# Patient Record
Sex: Female | Born: 1965
Health system: Southern US, Community
[De-identification: ages and names within clinical notes are randomized; demographics above are authoritative.]

## PROBLEM LIST (undated history)

## (undated) DIAGNOSIS — Z9889 Other specified postprocedural states: Secondary | ICD-10-CM

## (undated) DIAGNOSIS — I82409 Acute embolism and thrombosis of unspecified deep veins of unspecified lower extremity: Secondary | ICD-10-CM

## (undated) DIAGNOSIS — M4800 Spinal stenosis, site unspecified: Secondary | ICD-10-CM

## (undated) DIAGNOSIS — J189 Pneumonia, unspecified organism: Secondary | ICD-10-CM

## (undated) DIAGNOSIS — D649 Anemia, unspecified: Secondary | ICD-10-CM

## (undated) DIAGNOSIS — R112 Nausea with vomiting, unspecified: Secondary | ICD-10-CM

## (undated) DIAGNOSIS — M810 Age-related osteoporosis without current pathological fracture: Secondary | ICD-10-CM

## (undated) DIAGNOSIS — M5136 Other intervertebral disc degeneration, lumbar region: Secondary | ICD-10-CM

## (undated) DIAGNOSIS — I10 Essential (primary) hypertension: Secondary | ICD-10-CM

## (undated) DIAGNOSIS — E209 Hypoparathyroidism, unspecified: Secondary | ICD-10-CM

## (undated) DIAGNOSIS — G43909 Migraine, unspecified, not intractable, without status migrainosus: Secondary | ICD-10-CM

## (undated) DIAGNOSIS — E559 Vitamin D deficiency, unspecified: Secondary | ICD-10-CM

## (undated) DIAGNOSIS — F32A Depression, unspecified: Secondary | ICD-10-CM

## (undated) DIAGNOSIS — Z86711 Personal history of pulmonary embolism: Secondary | ICD-10-CM

## (undated) DIAGNOSIS — Z87442 Personal history of urinary calculi: Secondary | ICD-10-CM

## (undated) DIAGNOSIS — E119 Type 2 diabetes mellitus without complications: Secondary | ICD-10-CM

## (undated) DIAGNOSIS — F329 Major depressive disorder, single episode, unspecified: Secondary | ICD-10-CM

## (undated) DIAGNOSIS — N809 Endometriosis, unspecified: Secondary | ICD-10-CM

## (undated) DIAGNOSIS — M8580 Other specified disorders of bone density and structure, unspecified site: Secondary | ICD-10-CM

## (undated) DIAGNOSIS — E669 Obesity, unspecified: Secondary | ICD-10-CM

## (undated) DIAGNOSIS — M1990 Unspecified osteoarthritis, unspecified site: Secondary | ICD-10-CM

## (undated) DIAGNOSIS — F419 Anxiety disorder, unspecified: Secondary | ICD-10-CM

## (undated) DIAGNOSIS — I872 Venous insufficiency (chronic) (peripheral): Secondary | ICD-10-CM

## (undated) DIAGNOSIS — K219 Gastro-esophageal reflux disease without esophagitis: Secondary | ICD-10-CM

## (undated) DIAGNOSIS — M51369 Other intervertebral disc degeneration, lumbar region without mention of lumbar back pain or lower extremity pain: Secondary | ICD-10-CM

## (undated) HISTORY — DX: Gastro-esophageal reflux disease without esophagitis: K21.9

## (undated) HISTORY — DX: Acute embolism and thrombosis of unspecified deep veins of unspecified lower extremity: I82.409

## (undated) HISTORY — PX: SALPINGOOPHORECTOMY: SHX82

## (undated) HISTORY — PX: TOTAL KNEE ARTHROPLASTY: SHX125

## (undated) HISTORY — DX: Essential (primary) hypertension: I10

## (undated) HISTORY — PX: COLONOSCOPY W/ POLYPECTOMY: SHX1380

## (undated) HISTORY — DX: Personal history of urinary calculi: Z87.442

## (undated) HISTORY — PX: JOINT REPLACEMENT: SHX530

## (undated) HISTORY — PX: KIDNEY STONE SURGERY: SHX686

---
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
## 1988-08-25 HISTORY — PX: CHOLECYSTECTOMY OPEN: SUR202

## 1999-07-31 ENCOUNTER — Encounter: Payer: Self-pay | Admitting: Specialist

## 1999-08-02 ENCOUNTER — Inpatient Hospital Stay (HOSPITAL_COMMUNITY): Admission: RE | Admit: 1999-08-02 | Discharge: 1999-08-07 | Payer: Self-pay | Admitting: Specialist

## 1999-08-14 ENCOUNTER — Ambulatory Visit (HOSPITAL_COMMUNITY): Admission: RE | Admit: 1999-08-14 | Discharge: 1999-08-14 | Payer: Self-pay | Admitting: Specialist

## 1999-10-28 ENCOUNTER — Ambulatory Visit: Admission: RE | Admit: 1999-10-28 | Discharge: 1999-10-28 | Payer: Self-pay | Admitting: Specialist

## 1999-11-04 ENCOUNTER — Ambulatory Visit: Admission: RE | Admit: 1999-11-04 | Discharge: 1999-11-04 | Payer: Self-pay | Admitting: Specialist

## 2001-06-02 ENCOUNTER — Encounter: Payer: Self-pay | Admitting: Specialist

## 2001-06-02 ENCOUNTER — Ambulatory Visit (HOSPITAL_COMMUNITY): Admission: RE | Admit: 2001-06-02 | Discharge: 2001-06-02 | Payer: Self-pay | Admitting: Specialist

## 2003-01-02 ENCOUNTER — Encounter: Admission: RE | Admit: 2003-01-02 | Discharge: 2003-01-02 | Payer: Self-pay | Admitting: Specialist

## 2003-01-02 ENCOUNTER — Encounter: Payer: Self-pay | Admitting: Specialist

## 2003-03-22 ENCOUNTER — Encounter: Admission: RE | Admit: 2003-03-22 | Discharge: 2003-06-20 | Payer: Self-pay | Admitting: *Deleted

## 2003-03-23 ENCOUNTER — Ambulatory Visit (HOSPITAL_COMMUNITY): Admission: RE | Admit: 2003-03-23 | Discharge: 2003-03-23 | Payer: Self-pay | Admitting: *Deleted

## 2003-03-23 ENCOUNTER — Encounter: Payer: Self-pay | Admitting: Cardiology

## 2003-03-29 ENCOUNTER — Encounter: Admission: RE | Admit: 2003-03-29 | Discharge: 2003-03-29 | Payer: Self-pay | Admitting: *Deleted

## 2003-07-07 ENCOUNTER — Encounter: Admission: RE | Admit: 2003-07-07 | Discharge: 2003-07-07 | Payer: Self-pay | Admitting: Specialist

## 2003-07-13 ENCOUNTER — Encounter: Admission: RE | Admit: 2003-07-13 | Discharge: 2003-10-11 | Payer: Self-pay | Admitting: *Deleted

## 2003-07-28 ENCOUNTER — Ambulatory Visit (HOSPITAL_COMMUNITY): Admission: RE | Admit: 2003-07-28 | Discharge: 2003-07-28 | Payer: Self-pay | Admitting: *Deleted

## 2003-07-31 ENCOUNTER — Inpatient Hospital Stay (HOSPITAL_COMMUNITY): Admission: RE | Admit: 2003-07-31 | Discharge: 2003-08-03 | Payer: Self-pay | Admitting: General Surgery

## 2003-07-31 HISTORY — PX: GASTRIC BYPASS: SHX52

## 2003-08-15 ENCOUNTER — Ambulatory Visit (HOSPITAL_COMMUNITY): Admission: RE | Admit: 2003-08-15 | Discharge: 2003-08-15 | Payer: Self-pay | Admitting: *Deleted

## 2003-10-02 ENCOUNTER — Encounter: Admission: RE | Admit: 2003-10-02 | Discharge: 2003-10-02 | Payer: Self-pay | Admitting: General Surgery

## 2003-10-23 ENCOUNTER — Encounter: Admission: RE | Admit: 2003-10-23 | Discharge: 2004-01-21 | Payer: Self-pay | Admitting: *Deleted

## 2004-01-14 DIAGNOSIS — I10 Essential (primary) hypertension: Secondary | ICD-10-CM | POA: Insufficient documentation

## 2004-01-29 ENCOUNTER — Encounter: Admission: RE | Admit: 2004-01-29 | Discharge: 2004-04-28 | Payer: Self-pay | Admitting: *Deleted

## 2004-01-31 DIAGNOSIS — F32A Depression, unspecified: Secondary | ICD-10-CM | POA: Insufficient documentation

## 2004-01-31 DIAGNOSIS — N200 Calculus of kidney: Secondary | ICD-10-CM | POA: Insufficient documentation

## 2004-03-12 ENCOUNTER — Ambulatory Visit (HOSPITAL_COMMUNITY): Admission: RE | Admit: 2004-03-12 | Discharge: 2004-03-12 | Payer: Self-pay | Admitting: General Surgery

## 2004-03-12 ENCOUNTER — Ambulatory Visit (HOSPITAL_BASED_OUTPATIENT_CLINIC_OR_DEPARTMENT_OTHER): Admission: RE | Admit: 2004-03-12 | Discharge: 2004-03-12 | Payer: Self-pay | Admitting: General Surgery

## 2004-03-12 ENCOUNTER — Encounter (INDEPENDENT_AMBULATORY_CARE_PROVIDER_SITE_OTHER): Payer: Self-pay | Admitting: *Deleted

## 2004-03-12 HISTORY — PX: MASS EXCISION: SHX2000

## 2004-03-22 ENCOUNTER — Inpatient Hospital Stay (HOSPITAL_COMMUNITY): Admission: EM | Admit: 2004-03-22 | Discharge: 2004-03-24 | Payer: Self-pay | Admitting: Emergency Medicine

## 2004-03-22 HISTORY — PX: BOWEL RESECTION: SHX1257

## 2004-07-30 ENCOUNTER — Encounter: Admission: RE | Admit: 2004-07-30 | Discharge: 2004-10-28 | Payer: Self-pay | Admitting: *Deleted

## 2004-09-06 ENCOUNTER — Encounter: Admission: RE | Admit: 2004-09-06 | Discharge: 2004-09-06 | Payer: Self-pay | Admitting: General Surgery

## 2004-09-09 HISTORY — PX: OTHER SURGICAL HISTORY: SHX169

## 2004-09-10 ENCOUNTER — Inpatient Hospital Stay (HOSPITAL_COMMUNITY): Admission: RE | Admit: 2004-09-10 | Discharge: 2004-09-11 | Payer: Self-pay | Admitting: General Surgery

## 2004-10-01 ENCOUNTER — Encounter: Admission: RE | Admit: 2004-10-01 | Discharge: 2004-10-01 | Payer: Self-pay | Admitting: General Surgery

## 2005-09-17 ENCOUNTER — Ambulatory Visit (HOSPITAL_COMMUNITY): Admission: RE | Admit: 2005-09-17 | Discharge: 2005-09-17 | Payer: Self-pay | Admitting: Specialist

## 2006-01-23 HISTORY — PX: KNEE ARTHROSCOPY: SUR90

## 2006-02-02 ENCOUNTER — Encounter: Admission: RE | Admit: 2006-02-02 | Discharge: 2006-02-02 | Payer: Self-pay | Admitting: General Surgery

## 2006-08-25 HISTORY — PX: ABDOMINAL HYSTERECTOMY: SHX81

## 2006-09-24 ENCOUNTER — Encounter: Admission: RE | Admit: 2006-09-24 | Discharge: 2006-09-24 | Payer: Self-pay | Admitting: General Surgery

## 2007-08-25 ENCOUNTER — Encounter: Admission: RE | Admit: 2007-08-25 | Discharge: 2007-08-25 | Payer: Self-pay | Admitting: Specialist

## 2007-08-26 HISTORY — PX: SPINE SURGERY: SHX786

## 2007-08-27 ENCOUNTER — Encounter: Admission: RE | Admit: 2007-08-27 | Discharge: 2007-08-27 | Payer: Self-pay | Admitting: Specialist

## 2007-12-13 ENCOUNTER — Encounter: Admission: RE | Admit: 2007-12-13 | Discharge: 2007-12-13 | Payer: Self-pay | Admitting: Orthopedic Surgery

## 2008-03-24 ENCOUNTER — Encounter: Admission: RE | Admit: 2008-03-24 | Discharge: 2008-03-24 | Payer: Self-pay | Admitting: Specialist

## 2008-04-13 ENCOUNTER — Observation Stay (HOSPITAL_COMMUNITY): Admission: RE | Admit: 2008-04-13 | Discharge: 2008-04-15 | Payer: Self-pay | Admitting: Specialist

## 2008-04-14 ENCOUNTER — Encounter (INDEPENDENT_AMBULATORY_CARE_PROVIDER_SITE_OTHER): Payer: Self-pay | Admitting: Specialist

## 2008-04-14 ENCOUNTER — Ambulatory Visit: Payer: Self-pay | Admitting: Vascular Surgery

## 2008-04-26 ENCOUNTER — Ambulatory Visit: Payer: Self-pay | Admitting: Vascular Surgery

## 2009-12-23 HISTORY — PX: KNEE ARTHROSCOPY: SUR90

## 2010-04-25 DIAGNOSIS — I82409 Acute embolism and thrombosis of unspecified deep veins of unspecified lower extremity: Secondary | ICD-10-CM

## 2010-04-25 HISTORY — DX: Acute embolism and thrombosis of unspecified deep veins of unspecified lower extremity: I82.409

## 2010-04-25 HISTORY — PX: NM MYOCAR PERF WALL MOTION: HXRAD629

## 2010-05-10 ENCOUNTER — Inpatient Hospital Stay (HOSPITAL_COMMUNITY)
Admission: EM | Admit: 2010-05-10 | Discharge: 2010-05-11 | Payer: Self-pay | Source: Home / Self Care | Admitting: Cardiovascular Disease

## 2010-10-27 ENCOUNTER — Emergency Department (HOSPITAL_COMMUNITY)
Admission: EM | Admit: 2010-10-27 | Discharge: 2010-10-27 | Disposition: A | Discharge status: Home / Self Care | Payer: 59 | Attending: Emergency Medicine | Admitting: Emergency Medicine

## 2010-10-27 DIAGNOSIS — M7989 Other specified soft tissue disorders: Secondary | ICD-10-CM | POA: Insufficient documentation

## 2010-10-27 DIAGNOSIS — M79609 Pain in unspecified limb: Secondary | ICD-10-CM | POA: Insufficient documentation

## 2010-10-27 DIAGNOSIS — Z86718 Personal history of other venous thrombosis and embolism: Secondary | ICD-10-CM | POA: Insufficient documentation

## 2010-10-27 DIAGNOSIS — Z79899 Other long term (current) drug therapy: Secondary | ICD-10-CM | POA: Insufficient documentation

## 2010-10-27 DIAGNOSIS — Z7901 Long term (current) use of anticoagulants: Secondary | ICD-10-CM | POA: Insufficient documentation

## 2010-10-27 LAB — BASIC METABOLIC PANEL
CO2: 17 mEq/L — ABNORMAL LOW (ref 19–32)
Chloride: 109 mEq/L (ref 96–112)
Creatinine, Ser: 0.68 mg/dL (ref 0.4–1.2)
GFR calc Af Amer: >60 mL/min (ref >60)
Glucose, Bld: 100 mg/dL — ABNORMAL HIGH (ref 70–99)

## 2010-10-27 LAB — PROTIME-INR: INR: 1.95 — ABNORMAL HIGH (ref 0.00–1.49)

## 2010-10-27 LAB — CBC
HCT: 38.5 % (ref 36.0–46.0)
Hemoglobin: 12.8 g/dL (ref 12.0–15.0)
MCH: 28.7 pg (ref 26.0–34.0)
MCHC: 33.2 g/dL (ref 30.0–36.0)
MCV: 86.3 fL (ref 78.0–100.0)
RBC: 4.46 MIL/uL (ref 3.87–5.11)

## 2010-11-07 LAB — PROTHROMBIN GENE MUTATION

## 2010-11-07 LAB — CARDIAC PANEL(CRET KIN+CKTOT+MB+TROPI): Relative Index: INVALID (ref 0.0–2.5)

## 2010-11-07 LAB — BETA-2-GLYCOPROTEIN I ABS, IGG/M/A
Beta-2 Glyco I IgG: 0 G Units (ref <20)
Beta-2-Glycoprotein I IgA: 0 A Units (ref <20)
Beta-2-Glycoprotein I IgM: 0 M Units (ref <20)

## 2010-11-07 LAB — CBC
Hemoglobin: 12.7 g/dL (ref 12.0–15.0)
Hemoglobin: 12.9 g/dL (ref 12.0–15.0)
MCH: 30.4 pg (ref 26.0–34.0)
MCH: 30.7 pg (ref 26.0–34.0)
MCHC: 33.2 g/dL (ref 30.0–36.0)
MCV: 92.4 fL (ref 78.0–100.0)
Platelets: 287 10*3/uL (ref 150–400)
RBC: 4.18 MIL/uL (ref 3.87–5.11)
RBC: 4.2 MIL/uL (ref 3.87–5.11)

## 2010-11-07 LAB — PROTEIN S, TOTAL: Protein S Ag, Total: 90 % (ref 70–140)

## 2010-11-07 LAB — D-DIMER, QUANTITATIVE: D-Dimer, Quant: 1.73 ug/mL-FEU — ABNORMAL HIGH (ref 0.00–0.48)

## 2010-11-07 LAB — DIFFERENTIAL
Basophils Absolute: 0 10*3/uL (ref 0.0–0.1)
Lymphocytes Relative: 28 % (ref 12–46)
Monocytes Relative: 7 % (ref 3–12)
Smear Review: ADEQUATE

## 2010-11-07 LAB — COMPREHENSIVE METABOLIC PANEL
BUN: 11 mg/dL (ref 6–23)
CO2: 22 mEq/L (ref 19–32)
Chloride: 105 mEq/L (ref 96–112)
Creatinine, Ser: 0.82 mg/dL (ref 0.4–1.2)
GFR calc non Af Amer: >60 mL/min (ref >60)
Total Bilirubin: 0.3 mg/dL (ref 0.3–1.2)

## 2010-11-07 LAB — BASIC METABOLIC PANEL
CO2: 27 mEq/L (ref 19–32)
Calcium: 8.2 mg/dL — ABNORMAL LOW (ref 8.4–10.5)
GFR calc Af Amer: >60 mL/min (ref >60)
Sodium: 139 mEq/L (ref 135–145)

## 2010-11-07 LAB — LUPUS ANTICOAGULANT PANEL: Lupus Anticoagulant: NOT DETECTED

## 2010-11-07 LAB — FACTOR 5 LEIDEN

## 2010-11-07 LAB — HOMOCYSTEINE: Homocysteine: 9.5 umol/L (ref 4.0–15.4)

## 2010-11-07 LAB — MAGNESIUM: Magnesium: 2.1 mg/dL (ref 1.5–2.5)

## 2010-11-07 LAB — CARDIOLIPIN ANTIBODIES, IGG, IGM, IGA: Anticardiolipin IgG: 1 GPL U/mL — ABNORMAL LOW (ref <23)

## 2010-11-07 LAB — PROTEIN S ACTIVITY: Protein S Activity: 52 % — ABNORMAL LOW (ref 69–129)

## 2011-01-07 NOTE — Op Note (Signed)
NAMEABEER, DESKINS                ACCOUNT NO.:  [id number]   MEDICAL RECORD NO.:  [id number]          PATIENT TYPE:  AMB   LOCATION:  DAY                          FACILITY:  [facility]   PHYSICIAN:  Jene Every, M.D.    DATE OF BIRTH:  Oct 26, 1965   DATE OF PROCEDURE:  04/13/2008  DATE OF DISCHARGE:                               OPERATIVE REPORT   PREOPERATIVE DIAGNOSES:  Spinal stenosis, herniated nucleus pulposus at  4-5 and possibly 5-1.   PROCEDURE PERFORMED:  Lateral recess decompression at 4-5,  hemilaminectomy at 5, microdiskectomy at 4-5.   ANESTHESIA:  General.   ASSISTANT:  Roma Schanz, P.A.   BRIEF HISTORY AND INDICATIONS:  The patient is a 45 year old with severe  left lower extremity radicular pain, positive neural tension sign, EHL  weakness and has an MRI which of April of 2008 which demonstrated  degenerative changes at 4-5 and 5-1, small disk protrusions at 4-5 and 5-  1.  She did have persistent symptoms.  She underwent a myelogram which  indicated some lateral recess stenosis at 4-5, multifactorial, small  amount of compressive disk protrusion at 5-1.  She was indicated for  decompression due to the persistent pain, positive neural tension sign  and had some EHL weakness and L5 radiculopathy.  We felt she had a  component of a dynamic neural compressive lesion.  She was indicated for  decompression.  The risks and benefits were discussed to include  bleeding, infection, damage to neurovascular structures, CSF leakage,  epidural fibrosis, adjacent segment disease needing fusion in the  future, anesthetic complications, DVT, PE, etc.   TECHNIQUE:  With the patient in the supine position after induction of  adequate anesthesia and clindamycin, the lumbar region was prepped and  draped in the usual sterile fashion.  Two 18 gauge spinal needles were  utilized to localize 4-5 and 5-1 interspace.  This was confirmed with x-  ray.  Incision was made from the  spinous process of 4 to below 5.  The  subcutaneous tissue was dissected.  Electrocautery was utilized to  achieve hemostasis.  Dorsolumbar fascia with skin incision.  The  paraspinous muscle elevated from lamina of 4 and 5.  Operating  microscope was draped brought in to the surgical field.  Hemilaminotomy  at the caudad edge of 4 was performed with a 2 mm Kerrison, detaching  the ligamentum flavum and from the cephalad edge of 5 doing the same.  We then proceeded to remove the ligamentum flavum from the interspace.  Significant hypertrophied ligamentum flavum was noted compressing the 5  root into the lateral recess.  There was a epidural venous plexus as  noted as well contributing to the neural compression, as a small focal  disk herniation.  Bipolar cautery was utilized to achieve hemostasis and  decompressed the lateral border to the medial border of pedicle.  We  then took a confirmatory radiograph with a hockey stick probe in the  foramen of 4.  We then performed an annulotomy and a copious portion of  disk material was removed from the disk space  with a straight and  upbiting pituitary.  A full diskectomy of herniated material was then  performed.  The disk space was copiously irrigated with antibiotic  irrigation.  We continued the foraminotomy distally removing the hemi-  lamina.  We tracked the 5 root out through the foramen.  We also placed  a hockey-stick probe below to the disk space at 5-1.  There was no  evidence of disk herniation at 5-1 or significant lateral recess  stenosis.  We placed bone wax on the cancellous surfaces.  We used  thrombin soaked Gelfoam after another round of antibiotic irrigation.  I  felt that this pathology was consistent with her preoperative clinical  symptomatology, predominantly L5 nerve root.  Next, the [facility]  retractor was removed.  Paraspinous muscle inspected with no active  bleeding.  Dorsolumbar fascia reapproximated with #1 Vicryl  interrupted  figure-of-eight sutures.  Subcutaneous tissue reapproximated with 2-0  Vicryl simple sutures.  Skin was reapproximated with staples and was  dressed sterilely.  She was placed supine on hospital bed, extubated  without difficulty and transported to the recovery room in satisfactory  condition.   The patient tolerated the procedure and there were no complications.  Minimal blood loss 20 mL.   The patient had a history of the DVT and a PE.  We used the TEDs and the  compression stockings throughout the case.  Her abdomen was free and the  popliteal angle was increased to avoid compression at the popliteal  region.  Postoperatively, the patient will be ambulated the day of  surgery.  We will obtain postoperative Doppler and have her sent  home  on appropriate DVT prophylaxis.      Jene Every, M.D.  Electronically Signed     JB/MEDQ  D:  04/13/2008  T:  04/13/2008  Job:  [id number]

## 2011-01-07 NOTE — Procedures (Signed)
DUPLEX DEEP VENOUS EXAM - LOWER EXTREMITY   INDICATION:  Right lower extremity pain and swelling, status post back  surgery.   HISTORY:  Edema:  Yes.  Trauma/Surgery:  Yes.  Pain:  Yes.  PE:  Yes.  Previous DVT:  Yes.  Anticoagulants:  None.  Other:   DUPLEX EXAM:                CFV   SFV   PopV  PTV    GSV                R  L  R  L  R  L  R   L  R  L  Thrombosis    o  o  o     o     o      o  Spontaneous   +  +  +     +     +      +  Phasic        +  +  +     +     +      +  Augmentation  +  +  +     +     +      +  Compressible  +  +  +     +     +      +  Competent     +  +  +     +     +      +   Legend:  + - yes  o - no  p - partial  D - decreased   IMPRESSION:  No evidence of deep venous thrombosis noted in the right  leg.   Notified Christine with results.    _____________________________  Larina Earthly, M.D.   MG/MEDQ  D:  04/26/2008  T:  04/26/2008  Job:  [id number]

## 2011-01-10 NOTE — Op Note (Signed)
NAME:  Ana Baker, Ana Baker                          ACCOUNT NO.:  [id number]   MEDICAL RECORD NO.:  [id number]                   PATIENT TYPE:  AMB   LOCATION:  DSC                                  FACILITY:  [facility]   PHYSICIAN:  Sharlet Salina T. Hoxworth, M.D.          DATE OF BIRTH:  04-24-1966   DATE OF PROCEDURE:  03/12/2004  DATE OF DISCHARGE:                                 OPERATIVE REPORT   PREOPERATIVE DIAGNOSIS:  Painful subcutaneous mass, right arm.   POSTOPERATIVE DIAGNOSIS:  Painful subcutaneous mass, right arm.   OPERATION PERFORMED:  Excision of 3 cm subcutaneous mass, right arm.   SURGEON:  Lorne Skeens. Hoxworth, M.D.   ANESTHESIA:  Local.   INDICATIONS FOR PROCEDURE:  Ana Baker is a 45 year old female with a tender  and uncomfortable slightly firm and irregular subcutaneous mass in the right  arm consistent with a lipoma or fibrolipoma.  Due to symptoms, excision has  been recommended and accepted.  She is brought to the minor suite for this.   DESCRIPTION OF PROCEDURE:  The patient was brought to the minor surgery  suite. The right arm was sterilely prepped and draped.  Local anesthesia was  infiltrated over the skin and underlying soft tissue.  A small transverse  incision was made over the mass and dissection carried down to the deep  subcutaneous tissue.  There was a discrete somewhat firm lobular fatty mass  consistent with lipoma that was bluntly completely dissected away from the  surrounding tissue and removed.  There was no bleeding.  The skin was closed  with running subcuticular 4-0 Vicryl and Steri-Strips.  Dry sterile dressing  was applied.                                               Lorne Skeens. Hoxworth, M.D.    Tory Emerald  D:  03/12/2004  T:  03/12/2004  Job:  [id number]

## 2011-01-10 NOTE — H&P (Signed)
NAME:  Ana Baker, Ana Baker                          ACCOUNT NO.:  [id number]   MEDICAL RECORD NO.:  [id number]                   PATIENT TYPE:  INP   LOCATION:  0101                                 FACILITY:  [facility]   PHYSICIAN:  Sharlet Salina T. Hoxworth, M.D.          DATE OF BIRTH:  [birth year]   DATE OF ADMISSION:  03/22/2004  DATE OF DISCHARGE:                                HISTORY & PHYSICAL   CHIEF COMPLAINT:  Abdominal pain.   HISTORY OF PRESENT ILLNESS:  Ana Baker is a 45 year old white female  approximately eight months status post laparoscopic Roux-en-Y gastric  bypass. She has done very well postoperatively with no GI or abdominal  complaints and has had a nearly 100-pound weight loss. This morning,  however, she developed fairly rapid onset of sharp and crampy mid abdominal  epigastric pain which gradually worsened throughout the morning. She  presented to the emergency room about four hours after onset after calling  me. The pain has been fairly severe. She has had some retching, but no  vomiting. Normal bowel movement yesterday. No fever or chills. She has felt  like she had a little bit of reflux and bloating over the past week.   PAST MEDICAL HISTORY:  Surgical history includes open cholecystectomy  through a midline incision a number of years ago. She has right knee  replacement. She has a history of DVT and pulmonary embolus following her  knee replacement. She has been treated for urolithiasis, migraine headaches,  and depression.   MEDICATIONS:  1. Inderal 40 mg a day for migraines prophylaxis.  2. Hydrochlorothiazide 12.5 mg a day for urolithiasis prevention.  3. Lexapro 10 mg a day.   ALLERGIES:  She is allergic to PENICILLIN.   SOCIAL HISTORY:  Single. No cigarette or alcohol use.   REVIEW OF SYSTEMS:  GENERAL: Weight loss as above. No fever or chills.  RESPIRATORY: No shortness of breath, cough, or wheezing. CARDIAC: No chest  pain, palpitations, or history  of heart disease. ABDOMEN/GI: As above.  GU:  No urinary voiding frequency.   PHYSICAL EXAMINATION:  VITAL SIGNS: Temperature 96.6, pulse 69, respirations  20, blood pressure 117/79.  GENERAL: An alert, pleasant white female in no acute distress.  SKIN: Warm and dry without rash or infection.  HEENT: Nonicteric. Nose and oropharynx clear.  No masses.  LUNGS: Clear to auscultation without increased work of breathing.  CARDIAC: Regular rate and rhythm without murmurs. No edema.  ABDOMEN: Possible mild distention.  There mild to moderate epigastric and  mid abdominal tenderness. No palpable mass or hepatosplenomegaly. No trocar  site hernias or incisional hernias.  EXTREMITIES: Well healed incision, right knee. No swelling deformity.  NEUROLOGIC: Alert and fully oriented. The rest of exam is grossly normal.   LABORATORY DATA:  White count 5.7, hemoglobin 14. Electrolytes abnormal for  a potassium of 3.1. LFTs were normal.   CT scan of the abdomen and  pelvis were obtained revealing evidence of  partial small bowel obstruction at about the level of the mid small bowel  with possibly a transition zone about the left mid abdomen or upper pelvis.   ASSESSMENT/PLAN:  Partial small bowel obstruction, eight months status post  gastric bypass. Concern of the situation is that she could have an internal  hernia with vascular compromise without. Emergent laparoscopy for diagnosis  and treatment have been recommended and accepted, and she is admitted for  this procedure.                                               Lorne Skeens. Hoxworth, M.D.    Tory Emerald  D:  03/22/2004  T:  03/22/2004  Job:  [id number]

## 2011-01-10 NOTE — Op Note (Signed)
NAME:  Ana Baker, Ana Baker                          ACCOUNT NO.:  [id number]   MEDICAL RECORD NO.:  [id number]                   PATIENT TYPE:  INP   LOCATION:  0155                                 FACILITY:  [facility]   PHYSICIAN:  Sharlet Salina T. Hoxworth, M.D.          DATE OF BIRTH:  07/29/1966   DATE OF PROCEDURE:  07/31/2003  DATE OF DISCHARGE:                                 OPERATIVE REPORT   PREOPERATIVE DIAGNOSES:  1. Morbid obesity.  2. Hypertension.  3. Osteoarthritis.   POSTOPERATIVE DIAGNOSES:  1. Morbid obesity.  2. Hypertension.  3. Osteoarthritis.   SURGICAL PROCEDURE:  Laparoscopic Roux-en-Y gastric bypass surgery.   SURGEON:  Lorne Skeens. Hoxworth, M.D.   ASSISTANT:  Thornton Park. Daphine Deutscher, M.D.   ANESTHESIA:  General.   BRIEF HISTORY:  Ana Baker is a 45 year old female with a lifelong history of  morbid obesity unresponsive to medical management.  She has comorbidities of  hypertension and osteoarthritis as well as a history of DVT and pulmonary  embolus in 1999.  She has had an open cholecystectomy.  After extensive  preoperative consultation and workup and assessment of  options detailed  elsewhere, we have elected to proceed with laparoscopic Roux-en-Y gastric  bypass.  She underwent mechanical and antibiotic bowel prep at home.  Was  brought to the operating room and has received preoperative broad-spectrum  antibiotics and 80 mg of Lovenox subcutaneously.   DESCRIPTION OF OPERATION:  The patient was carefully positioned and padded  on the operating table and a general endotracheal anesthesia was induced.  The abdomen was widely sterilely prepped and draped.  Local anesthesia was  used to infiltrate the trocar sites prior to the incisions.  A 1 cm incision  was made at the left costal margin at the midclavicular line and access was  obtained with the Optiview trocar without difficulty.  Noted were extensive  omental adhesions to her previous upper midline incision  but no apparent  bowel adhesions.  Under direct vision a 5 mm trocar was placed in the left  flank.  Using a Harmonic scalpel through this port the adhesions were all  taken down from the anterior double wall and the falciform ligament was also  taken down and the edge of the right lobe of the liver mobilized from  adhesions, all from her previous surgery.  This allowed under direct vision  two 12 mm trocars to be placed in the mid-upper abdomen to the left and  right of midline and another 12 mm trocar in the right upper quadrant in an  area where the falciform ligament had been taken down.  The bowel appeared  normal, and there were no small bowel adhesions.  The omentum and mesocolon  were elevated and the ligament of Treitz identified and a 40 cm afferent  limb measured.  At this point the bowel appeared quite mobile, reaching up  in an anticolic fashion to the  left lobe of the liver.  The small bowel was  divided at this point with a single firing of the Endo-GIA 45 mm white load  stapler.  The Roux limb was mobilized by dividing the mesentery somewhat  down through one further arcade with the Harmonic scalpel.  The efferent  limb was marked by suturing a Penrose drain to it.  The 100 cm efferent limb  was then carefully measured.  At this point a stay suture was placed between  the afferent limb and the efferent limb and a side-to-side jejunojejunostomy  was created with enterotomies being created with the Harmonic scalpel and  with a single firing of the white load 45 mm stapler.  The common enterotomy  was then closed with running 2-0 Vicryl begun at either end of the defect  and tied centrally.  Following this the mesenteric defect beneath the  afferent limb was closed with running 2-0 silk and this was carried onto the  jejunojejunostomy and completely closing this mesenteric defect.  Tisseel  tissue sealant was placed over the enterotomy closure.  The patient was then  placed in  reverse Trendelenburg and a Nathanson liver retractor placed  through a 5 mm site in the subxiphoid area and the left lobe elevated,  nicely exposing the angle of His and the EG junction.  The angle of His was  mobilized off the left crus with the Harmonic scalpel, as was the first  portion of the greater curve and up onto the EG junction.  The lesser curve  was then carefully examined and a point about 5 cm from the EG junction just  below the second vessel was chosen and using Harmonic scalpel, dissection  was carried along the lesser curve directly onto the stomach toward the  lesser sac.  This dissection progressed to right angles along the stomach  well for several centimeters and then a firing of the Endo-GIA 45 mm blue  load stapler was used to divide the stomach at right angles after making  sure to remove all tubes from the stomach.  Further dissection in the  retrogastric area up toward the angle of His entered the lesser sac.  A  tubular proximal pouch was then created with four more firings of the Endo-  GIA blue load stapler up toward the angle of His with the last two firings  being done with the Ewald tube being placed to avoid narrowing the EG  junction, and it could be seen that the stomach was completely divided up  through the angle of His, creating a nice tubular approximately 5 cm pouch.  At this point the efferent limb was brought up to the pouch and came up  without tension.  The top end of the staple line near the angle of His in  the gastric pouch was covered with Tisseel tissue sealant.  A posterior row  of running 2-0 Vicryl was then used to suture the efferent limb along the  posterior wall and staple line of the gastric pouch working superior and  inferior.  Enterotomies were made of the pouch and the efferent limb with  the Harmonic scalpel and the 45 mm linear blue load stapler was used to  create the gastrojejunostomy.  The staple line was inspected, and there  was no bleeding.  The common defect was then closed with running 2-0 Vicryl  beginning at either end of the defect and tied centrally.  Following this an  outer seromuscular layer of 2-0  Vicryl was run from well above the  enterotomy closure down over it and then to the previously-placed posterior  suture.  This appeared to effect a good, patent anastomosis under no  tension.  At this point clamping the efferent limb, the patient was was  endoscoped by Dr. Daphine Deutscher, which revealed a patent anastomosis, no bleeding.  With the anastomosis under saline and the pouch inflated, there was no  evidence of air leak.  The pouch was then deflated and the endoscope  removed.  The remainder of the staple and suture line of the  gastrojejunostomy was coated with Tisseel tissue sealant.  A closed suction  drain was brought out through the lateral 5 mm trocar site and left in the  subhepatic space.  All CO2 was evacuated and the trocar was removed.  The  skin incisions were closed with staples.  Sponge, needle, and instrument  counts were correct.  Dry sterile dressings were applied, and the patient  was taken to recovery in good condition.                                               Lorne Skeens. Hoxworth, M.D.    Tory Emerald  D:  07/31/2003  T:  07/31/2003  Job:  [id number]

## 2011-01-10 NOTE — Op Note (Signed)
NAME:  Ana Baker, Ana Baker                          ACCOUNT NO.:  [id number]   MEDICAL RECORD NO.:  [id number]                   PATIENT TYPE:  INP   LOCATION:  0482                                 FACILITY:  [facility]   PHYSICIAN:  Sharlet Salina T. Hoxworth, M.D.          DATE OF BIRTH:  [birth year]   DATE OF PROCEDURE:  03/22/2004  DATE OF DISCHARGE:                                 OPERATIVE REPORT   PREOPERATIVE DIAGNOSES:  Small bowel obstruction.   POSTOPERATIVE DIAGNOSES:  Small bowel obstruction.   PROCEDURE:  Laparoscopy and lysis of adhesions for small bowel obstruction.   SURGEON:  Lorne Skeens. Hoxworth, M.D.   ASSISTANT:  Sandria Bales. Ezzard Standing, M.D.   ANESTHESIA:  General.   BRIEF HISTORY:  Ana Baker is a 45 year old female who is approximately  eight months following laparoscopic roux-Y gastric bypass with an  approximately 100 pound weight loss.  She has done extremely well with no GI  symptoms until this morning when she developed acute fairly severe crampy  upper and mid abdominal pain with retching.  She was brought into the  emergency room and evaluation included a CT scan which has shown evidence of  partial small bowel obstruction with both dilated and decompressed small  bowel with an apparent transition possibly in the left mid abdomen in the  more distal bowel.  With this finding and history of laparoscopic bypass and  concern over internal hernia, emergency laparoscopy was recommended and  accepted. She was brought to the operating room for this procedure.   DESCRIPTION OF PROCEDURE:  The patient was brought to the operating room and  placed in supine position on the operating table and general endotracheal  anesthesia was induced. She had been given Lovenox 40 mg subcutaneously and  Cipro intravenously. The abdomen was widely sterilely prepped and draped.  Access was obtained with an Optiview trocar in the left upper quadrant  subcostal space mid clavicular line without  difficulty and pneumoperitoneum  established.  A laparoscopy revealed some definite dilated loops of bowel.  There was no evidence of ischemia.  No free fluid or bloody fluid all in the  abdomen.  Two further 5 mm trocars were placed in the mid abdomen.  Another  12 mm trocar was placed in the left mid to lower abdomen and the camera was  replaced here.  Initially some decompressed distal bowel was tracked and  this lead distally to the ileocecal valve. This was traced back proximally  and an area of adhesions about the mid small bowel was found that I  initially thought might be the jejunojejunostomy.  However, tracing the  bowel more proximally, the jejunojejunostomy was encountered clearly. The  bowel was mildly and moderately dilated at this level but there was  certainly no evidence of ischemia or hemorrhagic bowel at any point.  The  efferent limb was identified and was traced back up to the gastric pouch and  it was not really particularly dilated. The afferent limb was also  identified and there was no evidence of hernia beneath the afferent limb.  It appeared mildly dilated.  Some adhesions around the jejunojejunostomy  were taken down allowing complete examination of the anastomosis and of the  posterior mesentery to exclude any hernias in this area.  The comon limb was  then traced distally and again areas of adhesion encountered somewhat distal  to this with mildly dilated bowel above this and decompressed bowel  distally.  There were a couple of loops that were adherent with some dense  adhesions to the left colon at about the junction of the left sigmoid colon.  These were carefully taken down under direct vision and this bowel  completely unkinked and it appeared to have been significantly tethered here  with some proximal dilatation and some definite decompression of this  distally. Following this, the bowel was again completely traced from the  ileocecal valve proximal to  the jejunojejunostomy which was in the correct  anatomic position and again the afferent limb traced back to the ligament of  Treitz with no herniation or defect posterior to this and then the efferent  limb traced back over the transverse colon with no defect posterior to the  efferent limb mesentery. It appeared that the partial obstruction had been  due to the adhesions more distally along the common limb. The patient had  had previous surgery, open cholecystectomy through a midline incision as  well as the laparoscopic gastric bypass.  At this point, we are satisfied  that there was no further obstruction, intestinal damage or internal  hernias.  Trocars removed and all CO2 evacuated. The skin incisions were  closed with interrupted subcuticular 4-0 Monocryl and Steri-Strips. The  patient was taken to the recovery room in good condition.                                               Lorne Skeens. Hoxworth, M.D.    Tory Emerald  D:  03/22/2004  T:  03/23/2004  Job:  [id number]

## 2011-01-10 NOTE — Op Note (Signed)
[place]  Patient:    Ana Baker                        MRN: [id number] Proc. Date: 08/02/99 Adm. Date:  [id number] Attending:  Erasmo Leventhal                           Operative Report  PREOPERATIVE DIAGNOSIS:  Right knee end-stage osteoarthritis.  PROCEDURE: Right total knee arthroplasty.  SURGEON: R. Valma Cava, M.D.  ASSISTANT: Cherly Beach, P.A.-C.  ANESTHESIA: General endotracheal.  ESTIMATED BLOOD LOSS: Less than 50 cc.  DRAINS: 2 Hemovacs.  COMPLICATIONS:  None.  TOURNIQUET TIME: 2 hour 5 minutes at 400 mmHg.  DESCRIPTION OF PROCEDURE: The patient was counseled in the holding area. Correct side was identified.  She confirmed the right side.  It was marked appropriately. IV antibiotics were given.  She was taken to the operating room and placed in supine position under general  endotracheal anesthesia.  The right knee was examined.  Range of motion was 5 degrees of flexion and contracture, flexion 100 degrees.  A Foley catheter was placed utilizing sterile technique by the OR circulator.  The right leg was elevated and prepped with DuraPrep, draped in sterile fashion. Exsanguination was accomplished with Esmarch.  The tourniquet was inflated to 400 mmHg.  I will note that this patient is morbidly obese.  She had a very large fleshy thigh and calf, extremely difficult surgery.  I felt it was accomplished  appropriately.  The medial parapatellar incision she had had from a previous operation was incorporated into the longitudinal incision through the skin and subcutaneous tissues keeping the skin flaps as thick as possible.  A medial parapatellar arthrotomy was performed.  The patella was everted.  There was end-stage osteoarthritis in the patellar femoral joint and medial compartment revealed bone on bone contact.  The cruciate ligaments were resected.  A starter hole was made in the  distal femur. The canal was irrigated.  Based upon preoperative x-rays and calculations we chose a 5 degree right cut placed at the bottom of the femur and then cut the distal femur with a 10 mm cut.  The distal femur was sized and found to be a size #9.  Rotational marks were made.  A cutting block was applied and the distal femur was cut appropriately.  The medial lateral meniscal remnants were removed with electrocautery. Hemostasis was obtained.  The tibia was sized and found to be a size #9.  A tibia starting  hole was then made for intramedullary guide.  He was placed on the right after he canal was irrigated.  We then took a 5 degree posterior slope and a 10 mm cut off the lateral side.  This was done protecting the soft tissues.  Osteophytes were  removed from the posteromedial and posterolateral femoral condyles under direct  visualization.  The femoral trochlea was prepared in standard fashion.  Size #9  femur and size #9 tibia with a 10 mm polyethylene insert was made with good range of motion and soft tissue balance.  The knee was well balanced at this time.  The patella was measured and found to be a size 26. It was reamed to a depth of 10 mm.  Locking holes were made and excess bone was removed.  The knee was then flexed. Delta keel was performed in standard fashion.  At  this point the knee was cemented into place using modern cement technique after pulsatile lavage of the knee.  A size #9 tibia, size #9 femur, a 26 mm patella.  Trials of 10 and 12 mm thickness tibial inserts were tried with a 12 mm insert. At this time we had excellent range of motion and soft tissue balance.  The knee was then flexed.  The final bone was implanted.  Throughout the procedure we irrigated the knee several times.  Patellar to femoral tracking was a bit lateral.  At this point in time a lateral release was performed in standard fashion with electrocautery.  The knee was put  through a range of motion and the knee had excellent range of motion, excellent varus and valgus stress testing.  It was well balanced and patellar femoral tracking was anatomic.  To medium Hemovac drains ere placed.  The knee was irrigated with antibiotic solution before closure.  Next, we had sequential closure in layers of the synovium, capsule, extensor mechanisms, subcutaneous tissue with Vicryl and then staples on the skin.  Then 30 cc of 1/2% Marcaine with epinephrine were placed at the drain site.  A sterile dressing was applied to the knee.  The tourniquet was deflated.  She ad excellent pulses at the foot and ankle at the end of the case, normal clinical alignment.  Sterile dressings were applied with ice pack and knee immobilizer. She was also given another Ancef and the tourniquet was deflated.  Sponge and needle counts were correct.  There were no complications.  She did well with the surgery.  She was awakened and extubated.  The left the operating room for the PACU in stable condition.  Mr. Yong Channel assistance was needed throughout this case. DD:  08/02/99 TD:  08/04/99 Job: [id number]

## 2011-01-10 NOTE — Op Note (Signed)
[name], Baker                ACCOUNT NO.:  [id number]   MEDICAL RECORD NO.:  [id number]          PATIENT TYPE:  INP   LOCATION:  NA                           FACILITY:  [facility]   PHYSICIAN:  Sharlet Salina T. Hoxworth, M.D.DATE OF BIRTH:  07-Apr-1966   DATE OF PROCEDURE:  09/09/2004  DATE OF DISCHARGE:                                 OPERATIVE REPORT   PREOPERATIVE DIAGNOSIS:  Internal hernia with intermittent small bowel  obstruction.   POSTOPERATIVE DIAGNOSIS:  Internal hernia with intermittent small bowel  obstruction - Petersen's defect hernia.   SURGICAL PROCEDURE:  Laparoscopic repair of Petersen's defect internal  hernia.   SURGEON:  Glenna Fellows, M.D.   ASSISTANT:  Danna Hefty, M.D.   ANESTHESIA:  General.   BRIEF HISTORY:  Ana Baker is a 45 year old white female who is just over  one year following laparoscopic Roux-Y gastric bypass for morbid obesity.  Ana Baker has lost 123 pounds.  Ana Baker was doing well until approximately 2 weeks ago  when Ana Baker developed intermittent, particularly postprandial, upper abdominal  pain without nausea and vomiting.  We have obtained a CT scan of the abdomen  which shows whorling of the small bowel mesentery consistent with a twist or  internal hernia.  I have recommended proceeding with laparoscopic  exploration.  The nature of the procedure, indications vs. Bleeding,  infection, intestinal injury, possible need for open surgery were discussed  and understood.  Ana Baker is now brought to the operating room for this  procedure.   DESCRIPTION OF OPERATION:  The patient was brought to the operating room,  placed in the supine position on the operating table and a general  endotracheal anesthesia was induced.  Ana Baker received preoperative antibiotics.  Lovenox 40 mg was given subcutaneously preoperatively.  The abdomen was  widely sterilely prepped and draped.  PAS were in place.  Local anesthesia  was used to infiltrate the old left mid  abdominal trocar site and dissection  was carried down onto the anterior fascia which was sharply incised and the  posterior fascia grasped with hemostats, incised and the preperitoneal  cavity entered.  Through mattress sutures of 0 Vicryl the Hasson trocar was  placed and pneumoperitoneum established.  Under direct vision a 12 mm trocar  was placed in the right abdomen below the umbilicus at an old trocar site.  Another 11 mm trocar was placed in the right mid abdomen through an old  trocar site.  There were some anterior adhesions of the omentum below and up  to the falciform ligament that were taken down with careful blunt and  harmonic scalpel dissection and then another 11 mm trocar placed in the  right upper quadrant and 5 mm trocar placed in the left flank.  There did  appear to be some mildly to moderately dilated bowel in the upper mid  abdomen.  It could be seen immediately that there was bowel coming through a  defect in Petersen's space behind the antecolic efferent Roux limb.  The  anatomy initially was difficult to determine which portion of intestine was  herniated through here  and, therefore, we went down and identified the  terminal ileum and ileocecal valve and then worked proximally.  Before going  very far proximally at all, the bowel passed below the mesentery of the Roux  limb through Petersen's defect over to the left side of the abdomen in an  abnormal position.  This bowel was then all carefully reduced from behind  Petersen's defect and actually a large portion of the common channel of the  jejunojejunostomy and the entire afferent limb had herniated through this  defect.  There was no evidence of ischemia or damage to the bowel.  After  this was all completely reduced, the anatomy was again carefully identified,  clearly identified the ligament of Treitz, tracing this 40 cm down to the  jejunojejunostomy which now was in normal position on the right side of the   efferent limb and then following the common channel on down as well.  We  then proceeded with repair of the Petersen's defect.  This was accomplished  with a running 2-0 silk suture beginning at the posterior aspect of the  defect, closing the posterior edge of the efferent limb small bowel  mesentery down to the retroperitoneum then to the mesentery of the  transverse colon working anteriorly up until this defect was completely  closed down over the top of the transverse colon at the omentum.  Following  this, the small bowel again was inspected, it was in normal position without  any evidence of injury.  The afferent limb could be seen going up over the  colon to the gastrojejunostomy and appeared normal.  Following this, the  trocar was removed and all CO2 evacuated.  Skin incisions were closed with  staples.  Sponge, needle, instrument counts were correct.  Dry sterile  dressings were applied and the patient was taken to recovery in good  condition.      BTH/MEDQ  D:  09/09/2004  T:  09/09/2004  Job:  [id number]

## 2011-01-10 NOTE — Op Note (Signed)
NAME:  Ana Baker, Ana Baker                          ACCOUNT NO.:  [id number]   MEDICAL RECORD NO.:  [id number]                   PATIENT TYPE:  OIB   LOCATION:  2899                                 FACILITY:  [facility]   PHYSICIAN:  Balinda Quails, M.D.                 DATE OF BIRTH:  01-21-1966   DATE OF PROCEDURE:  08/15/2003  DATE OF DISCHARGE:                                 OPERATIVE REPORT   SURGEON:  Balinda Quails, M.D.   PREOPERATIVE DIAGNOSES:  1. Morbid obesity.  2. History of venous thromboembolism.   PROCEDURE:  1. Inferior vena cavogram.  2. Retrieval of Optease filter.   ACCESS:  Right common femoral vein.   CONTRAST:  Was 35 mL of Visipaque.   COMPLICATIONS:  None apparent.   INDICATIONS FOR PROCEDURE:  The patient is a 45 year old female with a  history of morbid obesity and a past history of venous thromboembolism.  She  underwent the placement of a retrievable Optease inferior vena cava filter  two weeks ago, prior to elective bariatric surgery.  She has completed her  bariatric surgery, and is brought back to the catheterization laboratory at  this time for retrieval of her filter.   DESCRIPTION OF PROCEDURE:  The patient was brought to the catheterization  laboratory in stable condition.  She was administered 2 mg of Nubain and 2  mg of Versed intravenously.  The skin and subcutaneous tissue in the right  groin were instilled with 1% Xylocaine.  A needle was used and introduced  into the right common femoral vein.  A posterior 3.5 J-wire was passed  through the needle into the inferior vena cava under fluoroscopy.  A long 8-  French sheath was then advanced over the guide wire.  The dilator and guide  wire were removed.  An inferior vena cavogram was obtained through the  sheath.  This revealed the inferior vena cava to be patent, without evidence  of retained thrombus within the filter.  A 15 mm snare was then advanced through the sheath, and snared onto the  inferior hook.  Traction was placed on the filter, and the sheath advanced  over the filter.  The filter and sheath were then all removed.  Pressure was  placed on the right groin.  The filter was examined and revealed a very  small amount of thrombus.  There were no apparent complications.  The patient was transferred to the holding area in stable condition.   FINAL IMPRESSION:  1. Widely patent inferior vena cava without evidence of thrombus.  2. Successful retrieval of Optease filter.                                               Balinda Quails, M.D.  PGH/MEDQ  D:  08/15/2003  T:  08/15/2003  Job:  [id number]   cc:   Lorne Skeens. Hoxworth, M.D.  [address], [address]  [place]  [place] [address]  Fax: [phone]   Peripheral Vascular Cath Lab - [facility]

## 2011-01-10 NOTE — Discharge Summary (Signed)
NAME:  Ana Baker, Ana Baker                          ACCOUNT NO.:  [id number]   MEDICAL RECORD NO.:  [id number]                   PATIENT TYPE:  INP   LOCATION:  0468                                 FACILITY:  [facility]   PHYSICIAN:  Sharlet Salina T. Hoxworth, M.D.          DATE OF BIRTH:  1965/09/25   DATE OF ADMISSION:  07/31/2003  DATE OF DISCHARGE:  08/03/2003                                 DISCHARGE SUMMARY   DISCHARGE DIAGNOSES:  1. Morbid obesity.  2. Hypertension.  3. Osteoarthritis.   SURGICAL PROCEDURE:  Laparoscopic Roux-en-Y gastric bypass on July 31, 2003.   HISTORY OF PRESENT ILLNESS:  Ana Baker is a 45 year old white female with a  long history of morbid obesity with multiple failed attempts at medical  weight loss.  BMI at this time is 48 with a weight of 320 pounds.  She has  comorbidities of hypertension, osteoarthritis requiring a knee replacement  at age 42.  Also, a history of deep venous thrombosis and pulmonary embolism  at the time of her knee replacement.  After a thorough outpatient work-up  and evaluation and extensive discussion of options, we elected to proceed  with laparoscopic Roux-Y gastric bypass, and she is admitted for this  procedure.   PAST MEDICAL HISTORY:  1. Surgical history includes open cholecystectomy through an upper midline     incision as well as knee replacement by Dr. Hayden Rasmussen in 2000.  2. She has been followed for urolithiasis, osteoarthritis, and had a DVT and     pulmonary embolism in 1999.  3. She had preoperative placement of a removable vena cava filter one week     prior to this procedure.  4. Migraine headaches.   MEDICATIONS:  1. Maxzide 25 mg daily.  2. K-Dur 20 mEq daily.  3. Ultracet 1 p.r.n.  4. Vicodin p.r.n.  5. Imitrex 100 mg daily.  6. Inderal 40 mg daily.   Social history, family history, and review of systems details, see H&P.   PERTINENT PHYSICAL EXAM:  VITAL SIGNS:  She is 5 feet 8 inches, 320 pounds.  Vital signs are within normal limits.  GENERAL:  Morbidly obese female in no acute distress.  Abdomen showed well-  healed incision without hernias.  There is a healed knee incision with some  minimal edema.  The remainder of physical exam was unremarkable.   HOSPITAL COURSE:  The patient was admitted on the morning of her procedure.  She underwent an uneventful laparoscopic Roux-Y gastric bypass.  Her  postoperative course was very smooth.  She had a Doppler exam of her lower  extremities on the first postop morning showing no DVT.  Gastrografin  swallow showed no leak or obstruction.  She was begun on clear liquids on  her first postoperative day which she tolerated well.  She had no particular  nausea or abdominal pain.  Vital signs remained stable.  Clear liquids were  advanced as tolerated on the second postop day which she tolerated well.  By  the third postoperative day, she was tolerating Glucerna diet without any  difficulty.  J-P drainage was minimal and serosanguineous.  Abdomen was soft  and nontender.  She was having no pain or nausea.  She is cleared for  discharge at this time.   DISCHARGE MEDICATIONS:  Same as admission plus Roxicet as needed for pain.   FOLLOW UP:  In our office in 3-4 days for drain removal.                                               Sharlet Salina T. Hoxworth, M.D.    Tory Emerald  D:  08/25/2003  T:  08/25/2003  Job:  [id number]   cc:   Linus Galas, MD  [facility], [place]

## 2011-01-10 NOTE — Op Note (Signed)
NAME:  BOBBYJO, MARULANDA                          ACCOUNT NO.:  [id number]   MEDICAL RECORD NO.:  [id number]                   PATIENT TYPE:  OIB   LOCATION:  2888                                 FACILITY:  [facility]   PHYSICIAN:  Balinda Quails, M.D.                 DATE OF BIRTH:  1965-10-09   DATE OF PROCEDURE:  07/28/2003  DATE OF DISCHARGE:  07/28/2003                                 OPERATIVE REPORT   PREOPERATIVE DIAGNOSIS:  1. Obesity.  2. Venous thromboembolic disease.   POSTOPERATIVE DIAGNOSIS:  1. Obesity.  2. Venous thromboembolic disease.   PROCEDURE:  1. Inferior vena cavagram.  2. Placement of Optease inferior vena cava filter.   CONTRAST:  40 mL Visipaque.   COMPLICATIONS:  None apparent.   INDICATIONS FOR PROCEDURE:  Niyah Mamaril is a 45 year old female with a  history of  morbid obesity. She has also suffered from deep venous  thrombosis. She is scheduled to undergo bariatric surgery. She was referred  for placement  of an inferior vena cava filter. She is brought to the  catheterization laboratory  at this time for the above.   DESCRIPTION OF PROCEDURE:  The patient was brought to the catheterization  laboratory in stable condition. She was placed in the supine position. She  administered 2 mg of Nubane, 2 mg of Versed intravenously. The skin and  subcutaneous tissue on the right groin were instilled with 1% Xylocaine.   A needle was easily introduced into the right common femoral vein. A  posterior 3-5 J-wire was passed through the needle  and into the inferior  vena cava under fluoroscopy. A long 5 French sheath was then advanced  over  the guide wire. An inferior vena cavagram was carried out through the  sheath. Then 40 mL of contrast was injected at 15 mL per section. This  delineated the origin of the renal veins bilaterally.   The guide wire and dilator were then removed. The Optease filter was then  placed in the sheath. The insertion device was then  used to advance the  filter to the appropriate position and the filter  deployed in the immediate  infrarenal position. The filter lined up parallel to the axis of the  inferior vena cava with minimal angulation.   The sheath was then removed. Pressure was placed on the right groin. The  patient was transferred to the holding area without apparent complications.   FINAL IMPRESSION:  1. Widely patent inferior vena cava without evidence of thrombus.  2. Successful deployment of Optease removable [place] filter in the     infrarenal inferior vena cava.                                               P.  Liliane Bade, M.D.    PGH/MEDQ  D:  07/28/2003  T:  07/29/2003  Job:  [id number]   cc:   Vikki Ports, M.D.  [address], [address]  [place]  [place] [address]  Fax: [phone]

## 2011-03-21 ENCOUNTER — Ambulatory Visit
Admission: RE | Admit: 2011-03-21 | Discharge: 2011-03-21 | Disposition: A | Discharge status: Home / Self Care | Payer: 59 | Source: Ambulatory Visit | Attending: General Surgery | Admitting: General Surgery

## 2011-03-21 ENCOUNTER — Encounter (INDEPENDENT_AMBULATORY_CARE_PROVIDER_SITE_OTHER): Payer: Self-pay | Admitting: General Surgery

## 2011-03-21 ENCOUNTER — Ambulatory Visit (INDEPENDENT_AMBULATORY_CARE_PROVIDER_SITE_OTHER): Payer: 59 | Admitting: General Surgery

## 2011-03-21 DIAGNOSIS — R1012 Left upper quadrant pain: Secondary | ICD-10-CM

## 2011-03-21 DIAGNOSIS — R109 Unspecified abdominal pain: Secondary | ICD-10-CM

## 2011-03-21 MED ORDER — IOHEXOL 300 MG/ML  SOLN
125.0000 mL | Freq: Once | INTRAMUSCULAR | Status: AC | PRN
  Administered 2011-03-21: 125 mL via INTRAVENOUS

## 2011-03-21 NOTE — Progress Notes (Signed)
HPI This is a patient of Dr. Johna Sheriff who has had a gastric bypass procedure before her back and initially 2004 subsequent small bowel obstruction in June of 2005 and then an internal hernia fixed laparoscopically in 2006.  Starting about a week and half ago the patient fell started getting some back pain. She went to see Dr. Isaias Cowman where an x-ray was done demonstrating normal soft B. an abnormal small bowel loop in the left upper quadrant.  PE On examination today the patient has normal bowel sounds. She has no palpable abdominal masses however there is a questionable fullness in the left upper quadrant. This is the same area where the patient also has some mild to moderate abdominal tenderness. She does not look toxic or septic.  Studiy review A CT with the x-ray done at Dr. Rogue Bussing office was had with the patient however it is unreadable on our computers.  Assessment Possible internal hernia with a history of a laparoscopic gastric bypass.  Plan Even with the x-ray is being seen on the studies done at Dr. Rogue Bussing office believe the patient would need a CT scan of the abdomen and pelvis with oral contrast to help delineate the current problem  We will send the patient for CT scan of the abdomen and pelvis with and results called into the doctor on call for [facility].

## 2011-03-24 ENCOUNTER — Telehealth (INDEPENDENT_AMBULATORY_CARE_PROVIDER_SITE_OTHER): Payer: Self-pay

## 2011-03-24 NOTE — Telephone Encounter (Signed)
Pt calling concerned about her CT results she received from Dr Derrell Lolling Friday night on call showing an internal hernia. The pt has a follow up appt with Dr Johna Sheriff in 2wks but she wanted something sooner but he is on vacation till then. The pt was advised to be on a liquid diet over the weekend and she wanted to know what to do about the diet. I advised pt to start her diet out slow with soft foods and really stay away from meats and breads till she comes in to see Dr Johna Sheriff. I advised the pt if any changes occur with fever,nausea,vomiting,or intense abdominal pain to call us back. Pt was in agreement with this plan./ [facility]

## 2011-04-02 ENCOUNTER — Ambulatory Visit (INDEPENDENT_AMBULATORY_CARE_PROVIDER_SITE_OTHER): Payer: 59 | Admitting: General Surgery

## 2011-04-02 ENCOUNTER — Encounter (INDEPENDENT_AMBULATORY_CARE_PROVIDER_SITE_OTHER): Payer: Self-pay | Admitting: General Surgery

## 2011-04-02 VITALS — BP 132/92 | HR 64 | Temp 96.9°F | Ht 68.0 in | Wt 270.4 lb

## 2011-04-02 DIAGNOSIS — K458 Other specified abdominal hernia without obstruction or gangrene: Secondary | ICD-10-CM

## 2011-04-02 DIAGNOSIS — R1012 Left upper quadrant pain: Secondary | ICD-10-CM

## 2011-04-02 NOTE — Patient Instructions (Signed)
We will contact you to schedule surgery. Call for any worsening symptoms.

## 2011-04-02 NOTE — Progress Notes (Signed)
Subjective:   Abdominal pain  Patient ID: Ana Baker, female   DOB: [birth year], 45 y.o.   MRN: [id number]  HPI Patient returns to the office due to recurrent abdominal pain. She has a significant history of laparoscopic Roux-en-Y gastric bypass in 2004. She presented at that time with a BMI of 48. She had very significant weight loss. She required laparoscopic repair of a Vonita Moss defect the hernia a year or 2 after surgery. I have not seen her since 2008. At that time she was having some episodic left-sided abdominal pain. Workup including CT scan was negative and the pain resolved. She states that about 2 weeks ago she slipped and fell forcefully onto her back. She has chronic back problems. When she fell she developed acute mid back and upper abdominal pain. She saw her orthopedic physician and a plain x-ray showed some dilated loops of bowel in the left upper quadrant. She spoke to one of our physicians a CT scan of the abdomen and pelvis was obtained which I have reviewed. This shows moderately dilated proximal sigmoid and left colon and decompressed distal colon. The radiology interpretation is that the sigmoid colon is coming through a mesenteric defect.  Her pain has gotten somewhat better but she continues to have persistent left upper quadrant discomfort. She has chronic constipation that is a little worse over the last couple of weeks. Of note she did have a colonoscopy 2 years ago with polypectomy. She had some low-grade nausea without vomiting and some increased reflux symptoms. She is now here for further evaluation.   Review of Systems  Constitutional: Positive for appetite change. Negative for fever.  Respiratory: Negative for cough, shortness of breath and wheezing.   Cardiovascular: Positive for leg swelling. Negative for chest pain and palpitations.  Gastrointestinal: Positive for nausea, abdominal pain, constipation and abdominal distention. Negative for vomiting, diarrhea, blood  in stool, anal bleeding and rectal pain.  Genitourinary: Negative.   Musculoskeletal: Positive for back pain, arthralgias and gait problem.  Neurological: Negative for syncope and weakness.       Objective:   Physical Exam Gen.: Obese Caucasian female in no acute distress Skin: Warm and dry without rash or HEENT: No palpable masses or thyromegaly. Sclera nonicteric. Pupils equal round reactive. Lymph nodes: No cervical supraclavicular or inguinal nodes palpable Lungs: Clear without wheezing or increased work of breathing Cardiovascular: Regular rate and rhythm. No murmurs. 1+ lower extremity edema. Abdomen: Obese. Well-healed incisions. There is mild left upper quadrant tenderness without mass or guarding. No discernible organomegaly. No abnormal wall hernias. Extremities: Multiple well-healed incisions. Trace edema.    Assessment:    45 year old female with history of laparoscopic Roux-en-Y gastric bypass. She has a previous history of laparoscopic Peterson defect hernia repair. She now presents with left upper quadrant pain and CT scan evidence of an internal hernia which appears to show the sigmoid colon herniated through the small bowel mesentery. She is not acutely ill or obstructed. She has significant comorbidities including recurrent DVT with her last episode about 9 months ago. She is chronically anticoagulated.    Plan:     I discussed options with the patient. I think with the evidence we see on CT scan and ongoing pain our best approach would be laparoscopic exploration for apparent mesenteric defect hernia. She would be at some increased risk due to her medical history and chronic anticoagulation. However if she does indeed have an internal hernia she is at risk for strangulation or bowel injury.  After discussion we will plan laparoscopic exploration and repair of a hernia if it is found. We discussed the nature and indications of the procedure. We discussed risks of anesthetic  complications, recurrent DVT, bowel injury, and possible need for open procedure. She understands that this may be a negative exploration.  We will get recommendations regarding her perioperative anticoagulation management from Dr. Alanda Amass.

## 2011-04-25 ENCOUNTER — Other Ambulatory Visit (INDEPENDENT_AMBULATORY_CARE_PROVIDER_SITE_OTHER): Payer: Self-pay | Admitting: General Surgery

## 2011-04-25 ENCOUNTER — Encounter (HOSPITAL_COMMUNITY): Payer: 59

## 2011-04-25 LAB — SURGICAL PCR SCREEN
MRSA, PCR: POSITIVE — AB
Staphylococcus aureus: POSITIVE — AB

## 2011-04-25 LAB — BASIC METABOLIC PANEL
CO2: 22 mEq/L (ref 19–32)
Chloride: 107 mEq/L (ref 96–112)
Creatinine, Ser: 0.68 mg/dL (ref 0.50–1.10)
GFR calc Af Amer: >60 mL/min (ref >60)
Potassium: 4.1 mEq/L (ref 3.5–5.1)
Sodium: 139 mEq/L (ref 135–145)

## 2011-04-25 LAB — CBC
MCH: 27.5 pg (ref 26.0–34.0)
MCHC: 31.9 g/dL (ref 30.0–36.0)
Platelets: 324 10*3/uL (ref 150–400)
RBC: 4.32 MIL/uL (ref 3.87–5.11)

## 2011-04-25 LAB — PROTIME-INR: Prothrombin Time: 26.4 seconds — ABNORMAL HIGH (ref 11.6–15.2)

## 2011-04-30 ENCOUNTER — Ambulatory Visit (HOSPITAL_COMMUNITY)
Admission: RE | Admit: 2011-04-30 | Discharge: 2011-04-30 | Disposition: A | Discharge status: Home / Self Care | Payer: 59 | Source: Ambulatory Visit | Attending: General Surgery | Admitting: General Surgery

## 2011-04-30 DIAGNOSIS — Z9884 Bariatric surgery status: Secondary | ICD-10-CM | POA: Insufficient documentation

## 2011-04-30 DIAGNOSIS — R109 Unspecified abdominal pain: Secondary | ICD-10-CM

## 2011-04-30 DIAGNOSIS — Z860100 Personal history of colon polyps, unspecified: Secondary | ICD-10-CM | POA: Insufficient documentation

## 2011-04-30 DIAGNOSIS — Z7901 Long term (current) use of anticoagulants: Secondary | ICD-10-CM | POA: Insufficient documentation

## 2011-04-30 DIAGNOSIS — Z86718 Personal history of other venous thrombosis and embolism: Secondary | ICD-10-CM | POA: Insufficient documentation

## 2011-04-30 DIAGNOSIS — Z01812 Encounter for preprocedural laboratory examination: Secondary | ICD-10-CM | POA: Insufficient documentation

## 2011-04-30 DIAGNOSIS — R1012 Left upper quadrant pain: Secondary | ICD-10-CM

## 2011-04-30 LAB — APTT: aPTT: 31 seconds (ref 24–37)

## 2011-04-30 LAB — PROTIME-INR
INR: 1.04 (ref 0.00–1.49)
Prothrombin Time: 13.8 seconds (ref 11.6–15.2)

## 2011-05-02 NOTE — Op Note (Signed)
NAMEJOLAINE, FRYBERGER                ACCOUNT NO.:  [id number]  MEDICAL RECORD NO.:  [id number]  LOCATION:  [facility]                         FACILITY:  [facility]  PHYSICIAN:  Sharlet Salina T. Tahirah Sara, M.D.DATE OF BIRTH:  [birth year]  DATE OF PROCEDURE: DATE OF DISCHARGE:                              OPERATIVE REPORT   PREOPERATIVE DIAGNOSIS:  Possible internal hernia.  POSTOPERATIVE DIAGNOSIS:  No internal hernia identified.  Probable functional colonic distention.  HISTORY OF PRESENT ILLNESS:  This patient is a 45 year old female status post laparoscopic Roux-en-Y gastric bypass in 2004.  She had repair of a Peterson defect internal hernia done laparoscopically in 2005.  She recently presented with episodic left-sided abdominal pain.  She had some similar pain in 2008, with a negative workup and negative CT scan. She recently fell and on subsequent x-ray used to evaluate her back pain.  She had dilated bowel in her left upper quadrant.  CT scan was obtained showing dilated proximal sigmoid and left colon decompressed distal colon, which by CT scan was felt possibly to be coming through a mesenteric defect.  She has had colonoscopy 2 years ago with polypectomy.  Due to these findings of possible internal hernia and ongoing a left upper quadrant discomfort, we have recommended proceeding with laparoscopy to rule out internal hernia.  Nature of procedure, indication, risk of anesthetic complications, bleeding, infection, and intestinal injury was discussed and understood.  She is now brought to operating room for this procedure.  DESCRIPTION OF OPERATION:  The patient was brought to operating room, placed in supine position on the operating table and general endotracheal anesthesia was induced.  The abdomen was widely sterilely prepped and draped.  She received preoperative IV antibiotics.  PAS were in place.  Correct patient procedure were verified.  Access was obtained in the left upper  quadrant with a 5 mm Optiview trocar without difficulty and pneumoperitoneum established.  There was no evidence of any trocar injury.  Under direct vision, a 5 mm trocar was placed in the left periumbilical area for the camera port and two 5 mm trocar were placed in the right upper quadrant and right midabdomen.  A thorough exploration was then performed.  We did not see any dilated loops of small bowel.  There were some dilated loops of colon in the left midabdomen and left upper quadrant.  Initially, I traced the Roux limb, which was in an antecolic position up toward the gastric pouch.  Looking behind the Roux limb, we encountered the ligament of Treitz, but there was no Peterson defect.  I traced the Roux limb down the appropriate distance about 100 cm to the jejunojejunostomy and there was no mesenteric defect here.  We then also traced the afferent limb down from the ligament of Treitz to the jejunojejunostomy, and this all appeared normal.  Distal small bowel loops appeared completely normal.  The transverse colon was somewhat adherent up to the anterior abdominal wall where the patient had a previous midline incision.  I then followed the transverse colon distally and it went under the Roux limb appropriately and then there was some dilated transverse colon distal to this.  We then could trace this  down to the splenic flexure, which was a little difficult to see due to some adhesions up to the anterior abdominal wall, but I could easily pick up the colon just distal to the splenic flexure which at this point was not dilated.  I followed this distally and this came to a fairly large redundant loop of dilated colon corresponding to the left colon or proximal sigmoid, which would correspond to the findings on CT scan.  This was a fairly large loop again dilated, but it was free from any adhesions and as I followed it distally, there was a transition to nondilated colon, but no  mesenteric defect, entrapment mass, or other abnormalities seen.  I then traced this distally to the sigmoid and distal sigmoid, which was all decompressed and normal.  We then reversed this process tracing the colon back proximally and again the sigmoid was decompressed as we got up to the distal left colon and proximal sigmoid.  There was a transition to dilated bowel, but no mechanical problem whatsoever could be seen.  We followed this dilated redundant loop back proximally to decompressed proximal left colon and splenic flexure and then there was again some dilated distal transverse colon, but no mechanical issue and this was distal to the Roux limb and proximal to Roux limb, the transverse colon was decompressed.  We then using harmonic scalpel did take down the adhesions around the splenic flexure of the left colon to the anterior abdominal wall as a possible source of pain, although certainly this did not appear to be any source of obstruction.  The abdomen was inspected for hemostasis, which was complete.  There was no evidence of bowel injury.  This appears to be a functional colonic problem.  All CO2 was evacuated and trocars were removed.  Skin incisions were closed with subcuticular Monocryl and Dermabond.  Sponge and instrument counts were correct.  She was taken to recovery room in good condition.     Lorne Skeens. Ailine Hefferan, M.D.     Tory Emerald  D:  04/30/2011  T:  04/30/2011  Job:  [id number]  Electronically Signed by Glenna Fellows M.D. on 05/02/2011 05:55:21 PM

## 2011-05-13 ENCOUNTER — Encounter (INDEPENDENT_AMBULATORY_CARE_PROVIDER_SITE_OTHER): Payer: Self-pay

## 2011-05-22 ENCOUNTER — Ambulatory Visit (INDEPENDENT_AMBULATORY_CARE_PROVIDER_SITE_OTHER): Payer: 59 | Admitting: General Surgery

## 2011-05-22 ENCOUNTER — Encounter (INDEPENDENT_AMBULATORY_CARE_PROVIDER_SITE_OTHER): Payer: Self-pay | Admitting: General Surgery

## 2011-05-22 VITALS — BP 118/76 | HR 64 | Temp 97.8°F | Resp 16 | Ht 68.0 in | Wt 267.2 lb

## 2011-05-22 DIAGNOSIS — Z9889 Other specified postprocedural states: Secondary | ICD-10-CM

## 2011-05-22 NOTE — Patient Instructions (Signed)
Call for questions or concerns.

## 2011-05-22 NOTE — Progress Notes (Signed)
Patient returns following diagnostic laparoscopy to rule out internal hernia status post laparoscopic gastric bypass. Operative findings were significant for colonic dilatation but there was no mechanical obstruction or internal hernia. She has since seen her gastroenterologist and colonoscopy is planned and she has discussed with her getting on a bowel regimen to manage this problem.  On examination she looks well. Abdomen is soft and nontender and wounds are all healed.  I plan to see her back in 6 months for more long-term bariatric followup.

## 2011-11-28 ENCOUNTER — Encounter (INDEPENDENT_AMBULATORY_CARE_PROVIDER_SITE_OTHER): Payer: 59 | Admitting: General Surgery

## 2012-03-25 HISTORY — PX: TRANSTHORACIC ECHOCARDIOGRAM: SHX275

## 2012-10-06 ENCOUNTER — Other Ambulatory Visit (HOSPITAL_BASED_OUTPATIENT_CLINIC_OR_DEPARTMENT_OTHER): Payer: Self-pay | Admitting: Family Medicine

## 2012-10-06 ENCOUNTER — Ambulatory Visit (HOSPITAL_BASED_OUTPATIENT_CLINIC_OR_DEPARTMENT_OTHER)
Admission: RE | Admit: 2012-10-06 | Discharge: 2012-10-06 | Disposition: A | Discharge status: Home / Self Care | Payer: BC Managed Care – PPO | Source: Ambulatory Visit | Attending: Family Medicine | Admitting: Family Medicine

## 2012-10-06 DIAGNOSIS — M25539 Pain in unspecified wrist: Secondary | ICD-10-CM

## 2012-10-06 DIAGNOSIS — M25549 Pain in joints of unspecified hand: Secondary | ICD-10-CM

## 2012-10-22 ENCOUNTER — Other Ambulatory Visit (HOSPITAL_COMMUNITY): Payer: Self-pay | Admitting: Cardiovascular Disease

## 2012-11-01 ENCOUNTER — Ambulatory Visit (HOSPITAL_COMMUNITY)
Admission: RE | Admit: 2012-11-01 | Discharge: 2012-11-01 | Disposition: A | Discharge status: Home / Self Care | Payer: BC Managed Care – PPO | Source: Ambulatory Visit | Attending: Cardiovascular Disease | Admitting: Cardiovascular Disease

## 2012-11-01 DIAGNOSIS — I82409 Acute embolism and thrombosis of unspecified deep veins of unspecified lower extremity: Secondary | ICD-10-CM | POA: Insufficient documentation

## 2012-11-01 NOTE — Progress Notes (Signed)
Bilateral Lower Ext. Venous Duplex Completed. Chauncy Lean

## 2013-03-23 IMAGING — CT CT ABD-PELV W/ CM
2 of 5 series · 12 of 32 positions shown, 17 images · IV contrast (READICAT/WATER & [ID] OMNI 300)
Comparison: 09/24/2006

CLINICAL DATA: Evaluate for internal hernia.  Patient with left
upper quadrant pain

CT ABDOMEN AND PELVIS WITH CONTRAST
TECHNIQUE: Multidetector CT imaging of the abdomen and pelvis was
performed following the standard protocol during bolus
administration of intravenous contrast.
Contrast: 125 ml of omni 300

[Series 2: abdomen w/ · axial · 0.98mm/px · z∈[-375,-110]mm · 4 of 89 slices shown, 9 images]
[im 18/89  soft-tissue]
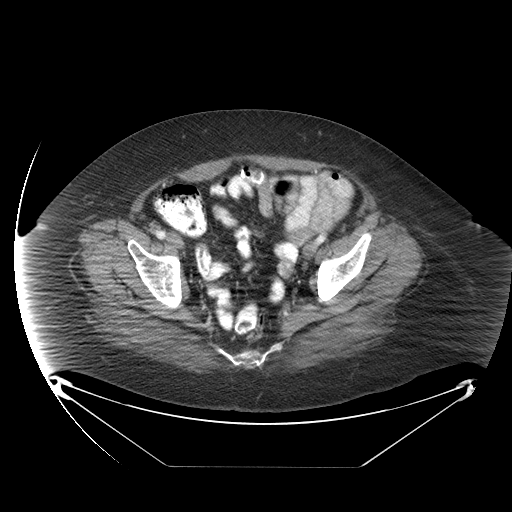
[im 18/89  lung]
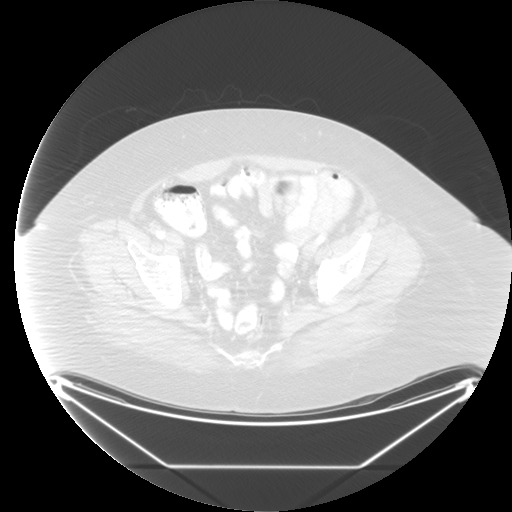
[im 18/89  bone]
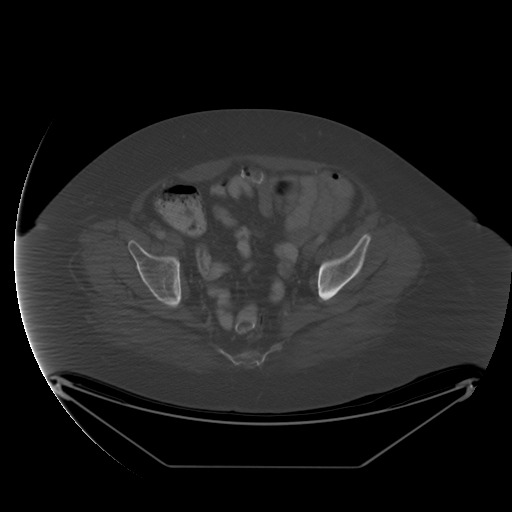
[im 36/89  soft-tissue]
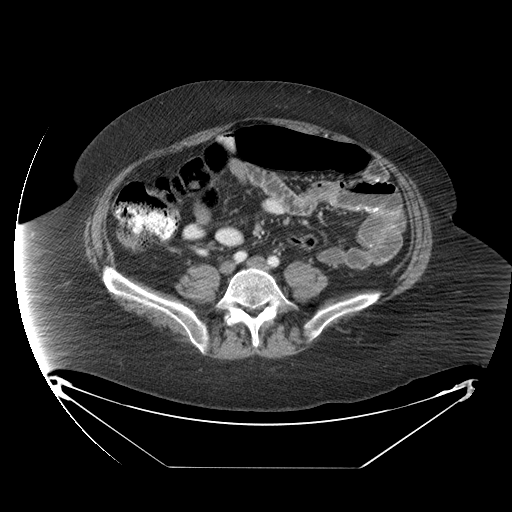
[im 36/89  lung]
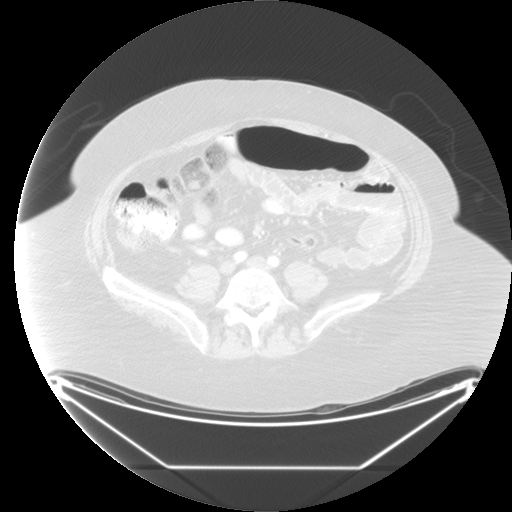
[im 53/89  soft-tissue]
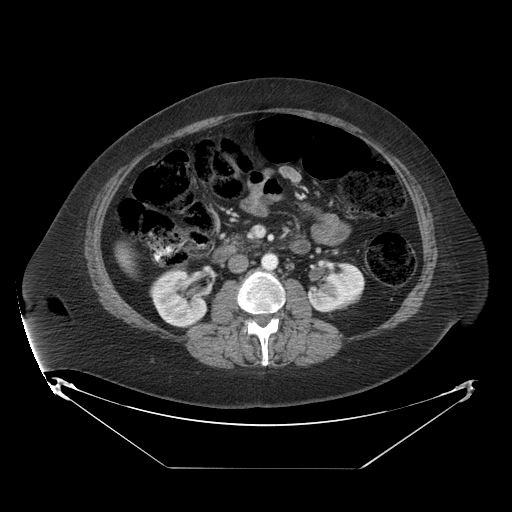
[im 53/89  lung]
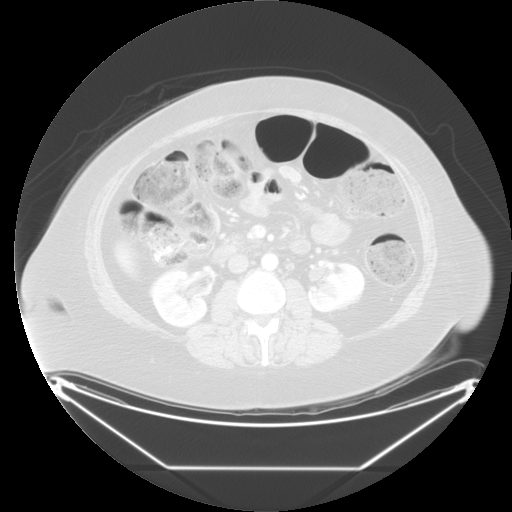
[im 71/89  soft-tissue]
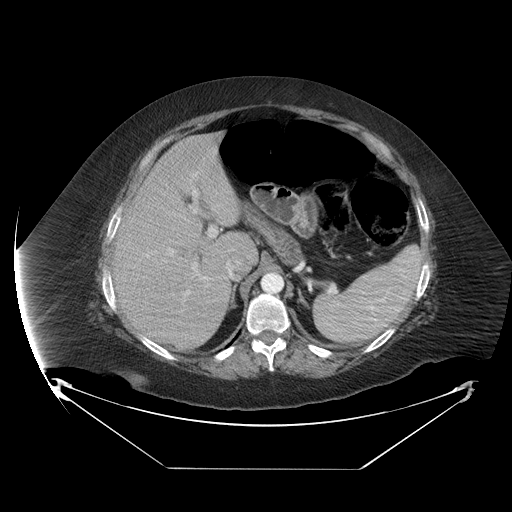
[im 71/89  lung]
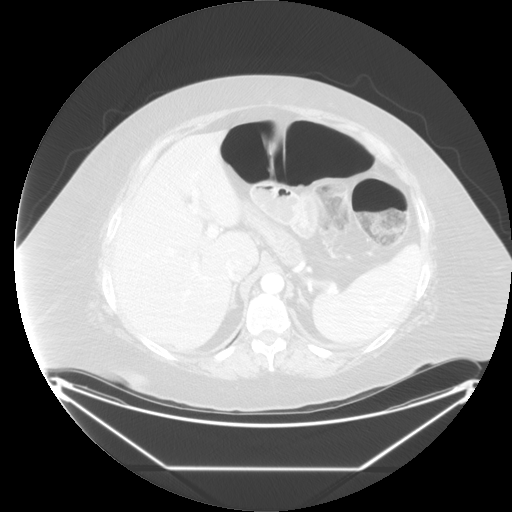

[Series 400: sag · sagittal · 0.98mm/px · 8 of 188 slices shown]
[im 16/188  soft-tissue]
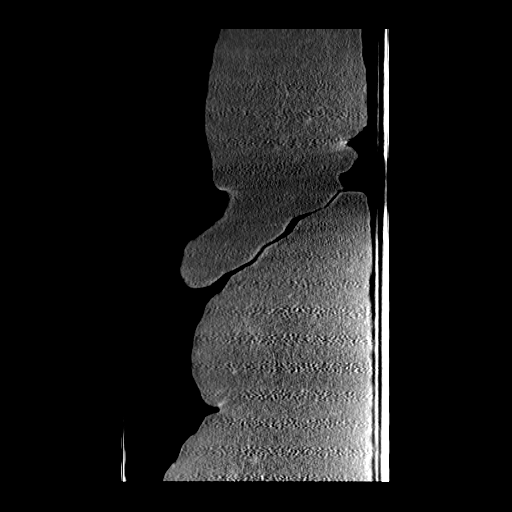
[im 47/188  soft-tissue]
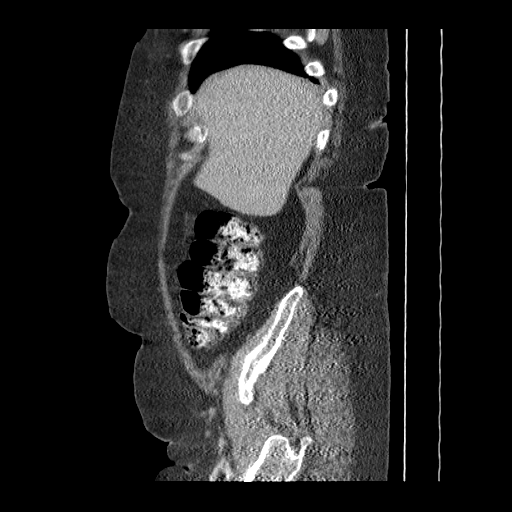
[im 63/188  soft-tissue]
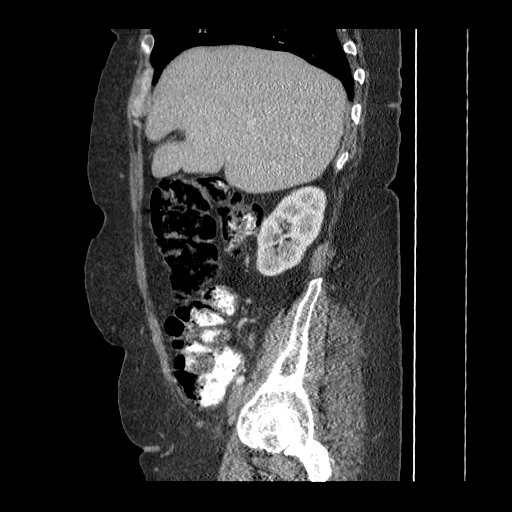
[im 78/188  soft-tissue]
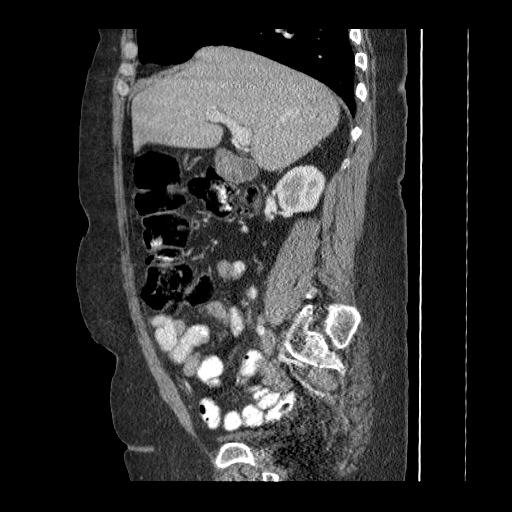
[im 110/188  soft-tissue]
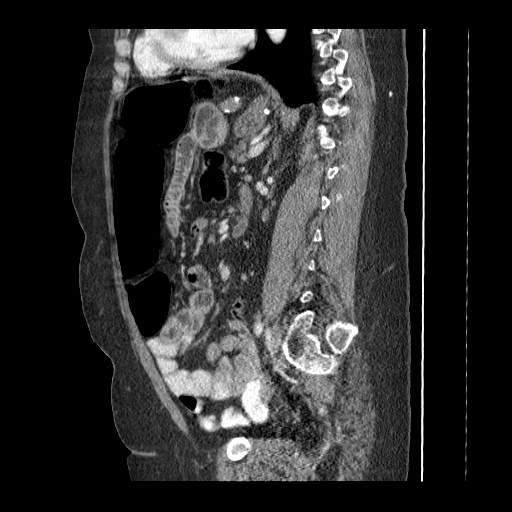
[im 125/188  soft-tissue]
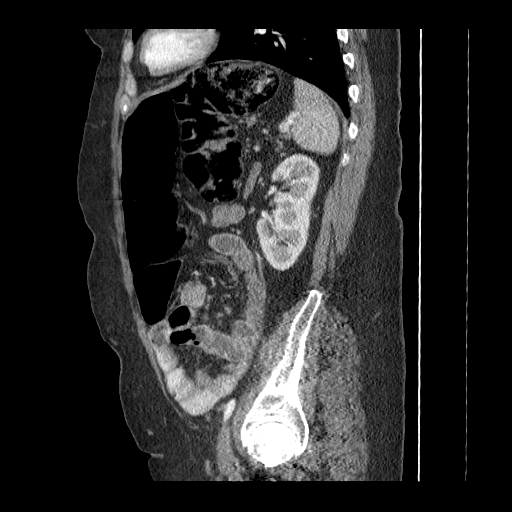
[im 141/188  soft-tissue]
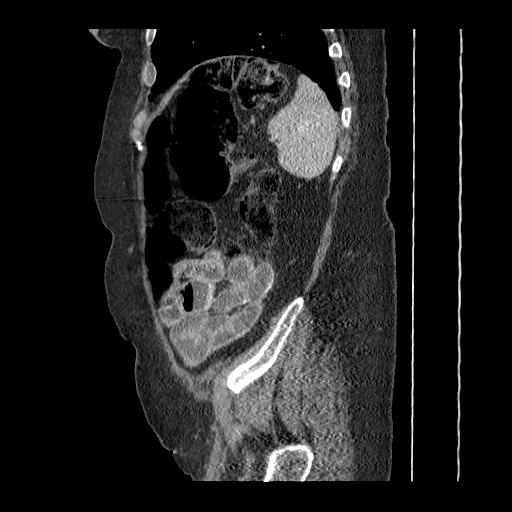
[im 172/188  soft-tissue]
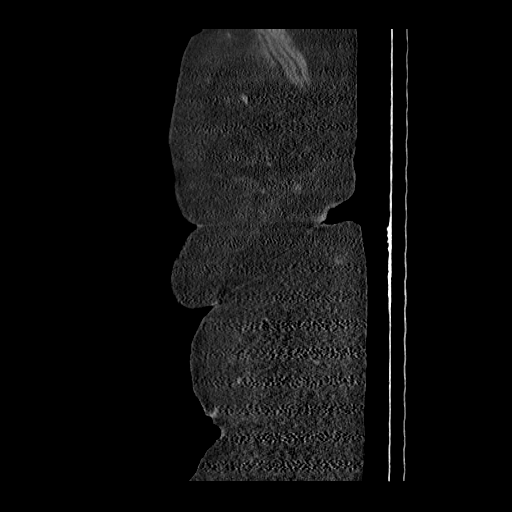

[12 of 32 positions shown; findings below may reference images not displayed]

FINDINGS: The lung bases are clear.  There is no pericardial or
pleural effusion.

There is no focal liver abnormality.

The the patient is status post cholecystectomy.  No significant
biliary ductal dilatation.

Mild pancreatic atrophy is noted.

The spleen is normal.

The adrenal glands are both normal.

Normal appearance of the kidneys.

There are no enlarged upper abdominal lymph nodes.

No enlarged pelvic or inguinal adenopathy.

Urinary bladder appears normal.

No free fluid or fluid collections identified within the abdomen or
the pelvis.

Postoperative changes compatible with gastric bypass surgery
identified.  The small bowel loops have a normal caliber without
evidence for obstruction.

The ingested enteric contrast material is identified within the
distal small bowel and proximal colon.

A redundant loop of sigmoid colon is identified which extends into
the left upper quadrant of the abdomen.  This appears to extend
through a defect in the small bowel mesentery. The mesenteric
defect with herniated bowel loops can be seen on image number 46.

The proximal loops of bowel there are increased in caliber and
there is complete collapse of the distal sigmoid colon and rectum.

There is no evidence for perforation.

No free fluid or abnormal fluid collections.

No acute bony abnormalities noted.
IMPRESSION: 1.  Suspect left upper quadrant internal hernia containing sigmoid
colon.  Hernia appears to extend through a defect within the small
bowel mesenteric.  A transition point from normal to increased
caliber of the large bowel loops to collapse sigmoid colon is
identified.
2.  Postoperative changes compatible with gastric bypass surgery.

## 2013-05-16 ENCOUNTER — Other Ambulatory Visit: Payer: Self-pay | Admitting: Cardiovascular Disease

## 2013-05-16 LAB — CBC WITH DIFFERENTIAL/PLATELET
Basophils Relative: 1 % (ref 0–1)
HCT: 37.5 % (ref 36.0–46.0)
Hemoglobin: 12.5 g/dL (ref 12.0–15.0)
Lymphs Abs: 1.3 10*3/uL (ref 0.7–4.0)
MCHC: 33.3 g/dL (ref 30.0–36.0)
Monocytes Absolute: 0.4 10*3/uL (ref 0.1–1.0)
Monocytes Relative: 9 % (ref 3–12)
Neutro Abs: 2.4 10*3/uL (ref 1.7–7.7)
RBC: 4.42 MIL/uL (ref 3.87–5.11)

## 2013-05-16 LAB — COMPREHENSIVE METABOLIC PANEL
Albumin: 3.9 g/dL (ref 3.5–5.2)
Alkaline Phosphatase: 47 U/L (ref 39–117)
BUN: 8 mg/dL (ref 6–23)
CO2: 26 mEq/L (ref 19–32)
Calcium: 9.1 mg/dL (ref 8.4–10.5)
Chloride: 108 mEq/L (ref 96–112)
Glucose, Bld: 82 mg/dL (ref 70–99)
Potassium: 4.3 mEq/L (ref 3.5–5.3)
Total Protein: 6.7 g/dL (ref 6.0–8.3)

## 2013-05-16 LAB — LIPID PANEL
Cholesterol: 157 mg/dL (ref 0–200)
HDL: 49 mg/dL (ref >39)
Triglycerides: 81 mg/dL (ref <150)

## 2013-05-20 ENCOUNTER — Encounter: Payer: Self-pay | Admitting: Cardiovascular Disease

## 2013-07-08 ENCOUNTER — Other Ambulatory Visit: Payer: Self-pay | Admitting: Pharmacist Clinician (PhC)/ Clinical Pharmacy Specialist

## 2013-07-08 MED ORDER — RIVAROXABAN 20 MG PO TABS: 20.0000 mg | ORAL_TABLET | Freq: Every day | ORAL | Status: DC

## 2013-09-19 ENCOUNTER — Other Ambulatory Visit: Payer: Self-pay | Admitting: Family Medicine

## 2013-09-19 ENCOUNTER — Other Ambulatory Visit: Payer: Self-pay | Admitting: *Deleted

## 2013-09-19 MED ORDER — TOPIRAMATE 50 MG PO TABS: 50.0000 mg | ORAL_TABLET | Freq: Two times a day (BID) | ORAL | Status: DC

## 2013-10-06 ENCOUNTER — Encounter: Payer: Self-pay | Admitting: *Deleted

## 2013-10-06 ENCOUNTER — Other Ambulatory Visit: Payer: Self-pay | Admitting: Family Medicine

## 2013-10-07 ENCOUNTER — Telehealth: Payer: Self-pay | Admitting: *Deleted

## 2013-10-07 ENCOUNTER — Other Ambulatory Visit: Payer: Self-pay | Admitting: *Deleted

## 2013-10-07 ENCOUNTER — Other Ambulatory Visit: Payer: Self-pay | Admitting: Family Medicine

## 2013-10-07 ENCOUNTER — Ambulatory Visit (INDEPENDENT_AMBULATORY_CARE_PROVIDER_SITE_OTHER): Payer: BC Managed Care – PPO | Admitting: Internal Medicine

## 2013-10-07 ENCOUNTER — Encounter: Payer: Self-pay | Admitting: Internal Medicine

## 2013-10-07 VITALS — BP 120/82 | HR 67 | Ht 68.0 in | Wt 243.0 lb

## 2013-10-07 DIAGNOSIS — M48061 Spinal stenosis, lumbar region without neurogenic claudication: Secondary | ICD-10-CM

## 2013-10-07 DIAGNOSIS — E669 Obesity, unspecified: Secondary | ICD-10-CM

## 2013-10-07 DIAGNOSIS — I82409 Acute embolism and thrombosis of unspecified deep veins of unspecified lower extremity: Secondary | ICD-10-CM

## 2013-10-07 DIAGNOSIS — Z7901 Long term (current) use of anticoagulants: Secondary | ICD-10-CM

## 2013-10-07 DIAGNOSIS — Z86711 Personal history of pulmonary embolism: Secondary | ICD-10-CM

## 2013-10-07 NOTE — Progress Notes (Signed)
OFFICE NOTE  Chief Complaint:  Pre-op clearance  Primary Care Physician: Ana Jubilee, MD  HPI:  Ana Baker is a pleasant 48 year old female presenting followed by Ana Baker. Unfortunately she has a history of morbid obesity and is status post gastric bypass surgery. She's had a number of both knee surgeries and lumbar surgeries with a history of lumbar stenosis. In the past she developed a DVT after her first surgery in 1999 and then had pulmonary emboli. She was treated on warfarin for this but then had problems with kidney stones and then developed significant bleeding. After surgery in 2004 she had an IVC filter placed and was removed. She also had recurrent DVT in 2011. She was then switched from warfarin over 2 Xarelto. So far Xarelto has been working well for her and a prior discussion with Ana Baker indicated that she may be able to come off of this, despite the fact that she's had 2 episodes of DVT and pulmonary emboli in the past. Today she wishes to discuss management of her Xarelto in the setting of possible spinal injections. She continues to have problems with low back pain and numbness and tingling in her right leg. There is a recommendation for injection however she will need to be off of Xarelto for this. Other than low back pain she has no other complaints.  PMHx:  Past Medical History  Diagnosis Date  . History of kidney stones   . Hypertension   . DVT (deep venous thrombosis) 04/2010    and PE, in post-operative setting   . GERD (gastroesophageal reflux disease)     Past Surgical History  Procedure Laterality Date  . Cholecystectomy open  1990  . Total knee arthroplasty Right 82,98,99,00    DVT & PE post-op 1999  . Knee arthroscopy Left 06,07  . Kidney stone surgery  98,00  . Gastric bypass  07/31/2003    DVT post-op - IV filter  . Mass excision  03/12/04    right arm  . Bowel resection  03/22/04    sbo  . Repair internal hernia  09/09/04  . Knee  arthroscopy Left 12/2009    Dr. Thomasena Baker  . Abdominal hysterectomy  2008  . Spine surgery  2009  . Joint replacement    . Transthoracic echocardiogram  03/2012    EF=>55%, trace MR & TR, normal RSVP  . Nm myocar perf wall motion  04/2010    dipyridamole; normal pattern of perfusion to all regions; EF 68%; low risk study     FAMHx:  Family History  Problem Relation Age of Onset  . Heart failure Father   . Heart disease Father   . Diabetes Mother   . Hypertension Mother   . Heart disease Mother     CHF  . Hypertension Sister   . Colon cancer Maternal Aunt   . Lung cancer Maternal Uncle   . Lung cancer Paternal Uncle   . Lymphoma Maternal Grandfather   . Heart failure Maternal Grandmother   . Diabetes Paternal Grandfather   . Heart Problems Paternal Grandfather   . Alzheimer's disease Paternal Grandmother     SOCHx:   reports that she has never smoked. She does not have any smokeless tobacco history on file. She reports that she does not drink alcohol or use illicit drugs.  ALLERGIES:  Allergies  Allergen Reactions  . Almond Oil   . Nsaids Nausea Only  . Penicillins Rash  . Vancomycin Rash  ROS: A comprehensive review of systems was negative except for: Musculoskeletal: positive for back pain  HOME MEDS: Current Outpatient Prescriptions  Medication Sig Dispense Refill  . AMITIZA 24 MCG capsule Take 24-48 mcg by mouth daily.      . Biotin 1000 MCG tablet Take 1,000 mcg by mouth daily as needed.       . Calcium Carbonate Antacid (TUMS PO) Take 1,500 mg by mouth.        . Cholecalciferol (VITAMIN D-3) 5000 UNITS TABS Take 1 tablet by mouth daily.      . cyclobenzaprine (FLEXERIL) 10 MG tablet Take 10 mg by mouth 3 (three) times daily as needed.        Marland Kitchen FLUoxetine (PROZAC) 40 MG capsule Take 40 mg by mouth daily.        . hydrochlorothiazide (,MICROZIDE/HYDRODIURIL,) 12.5 MG capsule Take 12.5 mg by mouth daily.        Marland Kitchen HYDROcodone-acetaminophen (NORCO) 5-325 MG per  tablet Take 1 tablet by mouth as needed.       Marland Kitchen morphine (MS CONTIN) 30 MG 12 hr tablet Take 30 mg by mouth 3 (three) times daily.      . pantoprazole (PROTONIX) 40 MG tablet Take 40 mg by mouth daily.        . potassium chloride (MICRO-K) 10 MEQ CR capsule Take 10 mEq by mouth daily as needed.      . Rivaroxaban (XARELTO) 20 MG TABS tablet Take 1 tablet (20 mg total) by mouth daily with supper.  30 tablet  5  . SUMAtriptan (IMITREX) 100 MG tablet Take 100 mg by mouth every 2 (two) hours as needed.        . topiramate (TOPAMAX) 50 MG tablet Take 1 tablet (50 mg total) by mouth 2 (two) times daily.  60 tablet  0  . VOLTAREN 1 % GEL Apply 1 application topically as needed.       No current facility-administered medications for this visit.    LABS/IMAGING: No results found for this or any previous visit (from the past 48 hour(s)). No results found.  VITALS: BP 120/82  Pulse 67  Ht 5\' 8"  (1.727 m)  Wt 243 lb (110.224 kg)  BMI 36.96 kg/m2  EXAM: General appearance: alert and no distress Neck: no carotid bruit and no JVD Lungs: clear to auscultation bilaterally Heart: regular rate and rhythm, S1, S2 normal, no murmur, click, rub or gallop Abdomen: soft, non-tender; bowel sounds normal; no masses,  no organomegaly Extremities: extremities normal, atraumatic, no cyanosis or edema Pulses: 2+ and symmetric Skin: Skin color, texture, turgor normal. No rashes or lesions Neurologic: Grossly normal Psych: Pleasant, mildly anxious  EKG: Normal sinus rhythm at 46  ASSESSMENT: 1. Recurrent DVT/PE on Xarelto 2. Spinal stenosis 3. Low risk for spinal injections 4. Obesity status post bariatric surgery  PLAN: 1.   Ana Baker is contemplating back injections for pain and numbness in her leg. I think she is at low risk for this and could hold her Xarelto for the procedure. I would recommend holding it for 48 hours prior to the procedure and is starting at one day after. We talked about the  pros and cons of either coming off of Xarelto or maintaining at long-term.  Given the fact that she's had 2 prior episodes and she has significant anxiety about coming off of Xarelto, coupled with the fact that she has had no bleeding problems, I would recommend that she stays on Xarelto long-term (lifetime).  Plan to see her back in 6 months. If she should require surgery I would recommend the same anticoagulation management around the time of surgery and feel that she would be low risk.  Chrystie NoseKenneth C. Bryor Rami, MD, [facility] Hospital[facility]ACC Attending Cardiologist [facility]  Savreen Gebhardt C 10/07/2013, 4:45 PM

## 2013-10-07 NOTE — Telephone Encounter (Signed)
Faxed to Dr. Shelle IronBeane clearance for epidural steroid injection L4-5  Hold Xarelto 48 hours prior to injection & restart again the following day after the injection.

## 2013-10-07 NOTE — Patient Instructions (Signed)
Your physician wants you to follow-up in: 6 months. You will receive a reminder letter in the mail two months in advance. If you don't receive a letter, please call our office to schedule the follow-up appointment.  You can HOLD xarelto 48 hours prior to injection and restart the day after the procedure. [facility]

## 2013-10-12 ENCOUNTER — Other Ambulatory Visit: Payer: Self-pay | Admitting: *Deleted

## 2013-10-12 MED ORDER — LUBIPROSTONE 24 MCG PO CAPS: 24.0000 ug | ORAL_CAPSULE | Freq: Two times a day (BID) | ORAL | Status: DC

## 2013-10-13 ENCOUNTER — Encounter: Payer: Self-pay | Admitting: *Deleted

## 2013-10-13 ENCOUNTER — Ambulatory Visit: Payer: BC Managed Care – PPO | Admitting: Family Medicine

## 2013-10-13 ENCOUNTER — Telehealth: Payer: Self-pay | Admitting: Internal Medicine

## 2013-10-13 NOTE — Telephone Encounter (Signed)
Why 5 days? That is longer than necessary .Marland Kitchen. It will be completely out of her system well before then.  It just exposes her to 3 days of unnecessary risk without reducing the risk of procedural bleeding more than holding it 2 days.  -Dr. Rennis GoldenHilty

## 2013-10-13 NOTE — Telephone Encounter (Signed)
Returned call to [place].  Left message to call back before 4pm and leave information r/t changes that require pt to hold longer as Dr. Rennis GoldenHilty is out of the office today.  Will forward to provider in the office once more information available.

## 2013-10-13 NOTE — Telephone Encounter (Signed)
[facility] and informed per Dr. Rennis GoldenHilty.  Stated she has asked the doctor, but in general they request 5 days.  Stated she will call back once she has more information.

## 2013-10-13 NOTE — Telephone Encounter (Signed)
Need clarence to stop her Xarelto for 5 days instead of 2 days please.Please fax asap to [phone].

## 2013-10-13 NOTE — Telephone Encounter (Signed)
Call was not returned.  Message forwarded to Dr. Dorene GrebeHilty/Jenna, RN.

## 2013-10-14 NOTE — Telephone Encounter (Signed)
Ana Baker transferred to triage.  Stated she talked w/ the doctor and he was not able to give any reason for request to hold Xarelto for 5 days other than it's their policy.  Ana Baker informed Dr. Rennis GoldenHilty will be notified, but has already stated pt is at unnecessary risk if held longer than 2 days as the medicine will be out of her system holding for 2 days.  Ana Baker verbalized understanding.  Message forwarded to Dr. Dorene GrebeHilty/Jenna, RN

## 2013-10-19 ENCOUNTER — Other Ambulatory Visit: Payer: Self-pay | Admitting: *Deleted

## 2013-10-21 NOTE — Telephone Encounter (Signed)
She is still waiting to ok for 5 days to hold her Xarelto.Need to know asap.

## 2013-10-21 NOTE — Telephone Encounter (Signed)
This was sent to Dr. Dorene GrebeHilty/Jenna, RN on 2.20.15.  Will resend.

## 2013-10-24 NOTE — Telephone Encounter (Signed)
Ok to hold 5 days before procedure. Please let them know.  Dr. Rennis GoldenHilty

## 2013-10-24 NOTE — Telephone Encounter (Signed)
Returned call to [facility] and informed per Dr. Rennis GoldenHilty.  Verbalized understanding.

## 2013-10-26 ENCOUNTER — Telehealth: Payer: Self-pay | Admitting: Pharmacist Clinician (PhC)/ Clinical Pharmacy Specialist

## 2013-10-26 NOTE — Telephone Encounter (Signed)
PA for Xarelto 20mg  sent on 3/3 and approved on 3/4.  Pt notified via VM  [employer]

## 2013-10-27 ENCOUNTER — Ambulatory Visit (INDEPENDENT_AMBULATORY_CARE_PROVIDER_SITE_OTHER): Payer: BC Managed Care – PPO | Admitting: Family Medicine

## 2013-10-27 ENCOUNTER — Encounter: Payer: Self-pay | Admitting: Family Medicine

## 2013-10-27 VITALS — BP 107/66 | HR 61 | Resp 16 | Ht 68.0 in | Wt 238.0 lb

## 2013-10-27 DIAGNOSIS — N200 Calculus of kidney: Secondary | ICD-10-CM

## 2013-10-27 DIAGNOSIS — F329 Major depressive disorder, single episode, unspecified: Secondary | ICD-10-CM

## 2013-10-27 DIAGNOSIS — K5900 Constipation, unspecified: Secondary | ICD-10-CM

## 2013-10-27 DIAGNOSIS — G43909 Migraine, unspecified, not intractable, without status migrainosus: Secondary | ICD-10-CM

## 2013-10-27 DIAGNOSIS — K219 Gastro-esophageal reflux disease without esophagitis: Secondary | ICD-10-CM

## 2013-10-27 DIAGNOSIS — F3289 Other specified depressive episodes: Secondary | ICD-10-CM

## 2013-10-27 MED ORDER — SUMATRIPTAN SUCCINATE 100 MG PO TABS: 100.0000 mg | ORAL_TABLET | ORAL | Status: AC | PRN

## 2013-10-27 MED ORDER — LINACLOTIDE 145 MCG PO CAPS: 145.0000 ug | ORAL_CAPSULE | Freq: Every day | ORAL | Status: DC

## 2013-10-27 MED ORDER — PANTOPRAZOLE SODIUM 40 MG PO TBEC: 40.0000 mg | DELAYED_RELEASE_TABLET | Freq: Every day | ORAL | Status: DC

## 2013-10-27 MED ORDER — TOPIRAMATE 50 MG PO TABS: 100.0000 mg | ORAL_TABLET | Freq: Every day | ORAL | Status: DC

## 2013-10-27 MED ORDER — FLUOXETINE HCL 40 MG PO CAPS: 40.0000 mg | ORAL_CAPSULE | Freq: Every day | ORAL | Status: DC

## 2013-10-27 MED ORDER — HYDROCHLOROTHIAZIDE 25 MG PO TABS: 25.0000 mg | ORAL_TABLET | Freq: Every day | ORAL | Status: DC

## 2013-10-27 MED ORDER — LUBIPROSTONE 24 MCG PO CAPS: 24.0000 ug | ORAL_CAPSULE | Freq: Two times a day (BID) | ORAL | Status: DC

## 2013-10-27 NOTE — Patient Instructions (Signed)

## 2013-10-27 NOTE — Progress Notes (Signed)
Subjective:    Patient ID: Ana Baker, female    DOB: 07/11/1966, 48 y.o.   MRN: [id number]  HPI  Ana Baker is here today to discuss the conditions listed below:  1)  Mood - She is doing well with her fluoxetine (40 mg daily) and needs a refill on it.    2)  GERD - She needs a refill on her Protonix.    3)  Constipation - She continues to do well with her Amitiza and needs a refill on it.    4)  Kidney Stones - She needs a refill on her HCTZ.    5)  Headaches - She continues to do well with the combination of Topamax and Imitrex.  She needs both medicine refilled.      Review of Systems  Constitutional: Negative for activity change, fatigue and unexpected weight change.  HENT: Negative.   Eyes: Negative.   Respiratory: Negative for shortness of breath.   Cardiovascular: Negative for chest pain, palpitations and leg swelling.  Gastrointestinal: Negative.  Negative for diarrhea and constipation.  Endocrine: Negative.   Genitourinary: Negative for difficulty urinating.  Musculoskeletal: Negative.   Skin: Negative.   Neurological: Negative.  Negative for headaches.  Hematological: Negative for adenopathy. Does not bruise/bleed easily.  Psychiatric/Behavioral: Negative for sleep disturbance and dysphoric mood. The patient is not nervous/anxious.   All other systems reviewed and are negative.    Past Medical History  Diagnosis Date  . History of kidney stones   . Hypertension   . DVT (deep venous thrombosis) 04/2010    and PE, in post-operative setting   . GERD (gastroesophageal reflux disease)      Past Surgical History  Procedure Laterality Date  . Cholecystectomy open  1990  . Total knee arthroplasty Right 82,98,99,00    DVT & PE post-op 1999  . Knee arthroscopy Left 06,07  . Kidney stone surgery  98,00  . Gastric bypass  07/31/2003    DVT post-op - IV filter  . Mass excision  03/12/04    right arm  . Bowel resection  03/22/04    sbo  . Repair internal hernia   09/09/04  . Knee arthroscopy Left 12/2009    Dr. Thomasena Edisollins  . Abdominal hysterectomy  2008  . Spine surgery  2009  . Joint replacement    . Transthoracic echocardiogram  03/2012    EF=>55%, trace MR & TR, normal RSVP  . 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 Heart failure Father   . Heart disease Father   . Diabetes Mother   . Hypertension Mother   . Heart disease Mother     CHF  . Hypertension Sister   . Colon cancer Maternal Aunt   . Lung cancer Maternal Uncle   . Lung cancer Paternal Uncle   . Lymphoma Maternal Grandfather   . Heart failure Maternal  Grandmother   . Diabetes Paternal Grandfather   . Heart Problems Paternal Grandfather   . Alzheimer's disease Paternal Grandmother      Current Outpatient Prescriptions on File Prior to Visit  Medication Sig Dispense Refill  . hydrochlorothiazide (,MICROZIDE/HYDRODIURIL,) 12.5 MG capsule Take 12.5 mg by mouth daily.        Marland Kitchen HYDROcodone-acetaminophen (NORCO) 5-325 MG per tablet Take 1 tablet by mouth as needed.       Marland Kitchen morphine (MS CONTIN) 30 MG 12 hr tablet Take 30 mg by mouth 3 (three) times daily.      . Rivaroxaban (XARELTO) 20 MG TABS tablet Take 1 tablet (20 mg total) by mouth daily with supper.  30 tablet  5  . Calcium Carbonate Antacid (TUMS PO) Take 1,500 mg by mouth.        . Cholecalciferol (VITAMIN D-3) 5000 UNITS TABS Take 1 tablet by mouth daily.      . cyclobenzaprine (FLEXERIL) 10 MG tablet Take 10 mg by mouth 3  (three) times daily as needed.        . potassium chloride (MICRO-K) 10 MEQ CR capsule Take 10 mEq by mouth daily as needed.       No current facility-administered medications on file prior to visit.     Allergies  Allergen Reactions  . Almond Oil   . Nsaids Nausea Only  . Penicillins Rash  . Vancomycin Rash     Immunization History  Administered Date(s) Administered  . Pneumococcal-Unspecified 05/15/2008  . Tdap 08/24/2006       Objective:   Physical Exam  Constitutional: She appears well-nourished. No distress.  HENT:  Head: Normocephalic.  Eyes: No scleral icterus.  Neck: No thyromegaly present.  Cardiovascular: Normal rate, regular rhythm and normal heart sounds.   Pulmonary/Chest: Effort normal and breath sounds normal.  Abdominal: There is no tenderness.  Musculoskeletal: She exhibits no edema and no tenderness.  Neurological: She is alert.  Skin: Skin is warm and dry.  Psychiatric: She has a normal mood and affect. Her behavior is normal. Judgment and thought content normal.      Assessment & Plan:    [name] was seen today for medication management.  Diagnoses and associated orders for this visit:  Unspecified constipation - lubiprostone (AMITIZA) 24 MCG capsule; Take 1 capsule (24 mcg total) by mouth 2 (two) times daily with a meal. - Linaclotide (LINZESS) 145 MCG CAPS capsule; Take 1 capsule (145 mcg total) by mouth daily.  Migraine, unspecified, without mention of intractable migraine without mention of status migrainosus - topiramate (TOPAMAX) 50 MG tablet; Take 2 tablets (100 mg total) by mouth at bedtime. - SUMAtriptan (IMITREX) 100 MG tablet; Take 1 tablet (100 mg total) by mouth every 2 (two) hours as needed for migraine.  GERD (gastroesophageal reflux disease) - pantoprazole (PROTONIX) 40 MG tablet; Take 1 tablet (40 mg total) by mouth daily.  Depressive disorder, not elsewhere classified - FLUoxetine (PROZAC) 40 MG capsule; Take 1 capsule (40  mg total) by mouth daily.  Kidney stones, calcium oxalate - hydrochlorothiazide (HYDRODIURIL) 25 MG tablet; Take 1 tablet (25 mg total) by mouth daily.   TIME SPENT "FACE TO FACE" WITH PATIENT -  30 MINS

## 2013-11-02 ENCOUNTER — Ambulatory Visit: Payer: BC Managed Care – PPO | Admitting: Pharmacist Clinician (PhC)/ Clinical Pharmacy Specialist

## 2013-12-14 ENCOUNTER — Telehealth: Payer: Self-pay

## 2013-12-14 ENCOUNTER — Telehealth: Payer: Self-pay | Admitting: *Deleted

## 2013-12-14 NOTE — Telephone Encounter (Signed)
Pt is calling in regards to her Xarelto not being approved by the insurance company. Needs to have a call back in regards to this. She is almost of medication.  CH

## 2013-12-14 NOTE — Telephone Encounter (Signed)
Prior authorization for Xarelto 20 mg faxed to patient's insurance company/mail order pharmacy. Awaiting approval. 

## 2013-12-16 ENCOUNTER — Telehealth: Payer: Self-pay | Admitting: *Deleted

## 2013-12-16 NOTE — Telephone Encounter (Signed)
Message forwarded to Chelley, CMA.  

## 2013-12-16 NOTE — Telephone Encounter (Signed)
Joy called from [facility] regarding a PA for Xarelto. She will be sending the fax, but wanted a verbal.  [facility]

## 2013-12-19 NOTE — Telephone Encounter (Signed)
[employer] faxed a form for further information.  Ana Baker, PharmD filled out form and this form was faxed back to pharmacy.

## 2013-12-22 NOTE — Telephone Encounter (Signed)
[employer] faxed a form for further information.  Wolfgang PhoenixK Alvstad, PharmD filled out form and this form was faxed back to pharmacy on 12/19/2013 at 4:40p.

## 2013-12-27 NOTE — Telephone Encounter (Signed)
Prior authorization for Xarelto has been approved through 12/17/2014. Patient notified.

## 2014-01-16 ENCOUNTER — Other Ambulatory Visit: Payer: Self-pay | Admitting: Pharmacist Clinician (PhC)/ Clinical Pharmacy Specialist

## 2014-04-28 ENCOUNTER — Other Ambulatory Visit: Payer: Self-pay | Admitting: Family Medicine

## 2014-05-02 ENCOUNTER — Encounter: Payer: Self-pay | Admitting: Internal Medicine

## 2014-05-02 ENCOUNTER — Ambulatory Visit (INDEPENDENT_AMBULATORY_CARE_PROVIDER_SITE_OTHER): Payer: BC Managed Care – PPO | Admitting: Internal Medicine

## 2014-05-02 VITALS — BP 122/78 | HR 66 | Ht 68.0 in | Wt 253.4 lb

## 2014-05-02 DIAGNOSIS — E669 Obesity, unspecified: Secondary | ICD-10-CM

## 2014-05-02 DIAGNOSIS — Z79899 Other long term (current) drug therapy: Secondary | ICD-10-CM

## 2014-05-02 DIAGNOSIS — Z86711 Personal history of pulmonary embolism: Secondary | ICD-10-CM

## 2014-05-02 DIAGNOSIS — E785 Hyperlipidemia, unspecified: Secondary | ICD-10-CM

## 2014-05-02 DIAGNOSIS — Z7901 Long term (current) use of anticoagulants: Secondary | ICD-10-CM

## 2014-05-02 DIAGNOSIS — I82409 Acute embolism and thrombosis of unspecified deep veins of unspecified lower extremity: Secondary | ICD-10-CM

## 2014-05-02 NOTE — Progress Notes (Signed)
OFFICE NOTE  Chief Complaint:  Pre-op clearance  Primary Care Physician: Birdena Jubilee, MD  HPI:  Ana Baker is a pleasant 48 year old female presenting followed by Dr. Alanda Amass. Unfortunately she has a history of morbid obesity and is status post gastric bypass surgery. She's had a number of both knee surgeries and lumbar surgeries with a history of lumbar stenosis. In the past she developed a DVT after her first surgery in 1999 and then had pulmonary emboli. She was treated on warfarin for this but then had problems with kidney stones and then developed significant bleeding. After surgery in 2004 she had an IVC filter placed and was removed. She also had recurrent DVT in 2011. She was then switched from warfarin over 2 Xarelto. So far Xarelto has been working well for her and a prior discussion with Dr. Alanda Amass indicated that she may be able to come off of this, despite the fact that she's had 2 episodes of DVT and pulmonary emboli in the past. Today she wishes to discuss management of her Xarelto in the setting of possible spinal injections. She continues to have problems with low back pain and numbness and tingling in her right leg. There is a recommendation for injection however she will need to be off of Xarelto for this. Other than low back pain she has no other complaints.  Ana Baker returns today for followup. Overall she has no complaints. She reports that her primary care provider is going to leave of absence and moving out of the [facility] health system. She is interested in getting laboratory work today and enrolling in a health and wellness program through her work, they are requiring laboratory work within the past year. The last laboratory work that I have is from September of last year therefore she is due for a recheck.  PMHx:  Past Medical History  Diagnosis Date  . History of kidney stones   . Hypertension   . DVT (deep venous thrombosis) 04/2010    and PE, in  post-operative setting   . GERD (gastroesophageal reflux disease)     Past Surgical History  Procedure Laterality Date  . Cholecystectomy open  1990  . Total knee arthroplasty Right 82,98,99,00    DVT & PE post-op 1999  . Knee arthroscopy Left 06,07  . Kidney stone surgery  98,00  . Gastric bypass  07/31/2003    DVT post-op - IV filter  . Mass excision  03/12/04    right arm  . Bowel resection  03/22/04    sbo  . Repair internal hernia  09/09/04  . Knee arthroscopy Left 12/2009    Dr. Thomasena Edis  . Abdominal hysterectomy  2008  . Spine surgery  2009  . Joint replacement    . Transthoracic echocardiogram  03/2012    EF=>55%, trace MR & TR, normal RSVP  . Nm myocar perf wall motion  04/2010    dipyridamole; normal pattern of perfusion to all regions; EF 68%; low risk study     FAMHx:  Family History  Problem Relation Age of Onset  . Heart failure Father   . Heart disease Father   . Diabetes Mother   . Hypertension Mother   . Heart disease Mother     CHF  . Hypertension Sister   . Colon cancer Maternal Aunt   . Lung cancer Maternal Uncle   . Lung cancer Paternal Uncle   . Lymphoma Maternal Grandfather   . Heart failure Maternal Grandmother   .  Diabetes Paternal Grandfather   . Heart Problems Paternal Grandfather   . Alzheimer's disease Paternal Grandmother     SOCHx:   reports that she has never smoked. She has never used smokeless tobacco. She reports that she does not drink alcohol or use illicit drugs.  ALLERGIES:  Allergies  Allergen Reactions  . Almond Oil   . Nsaids Nausea Only  . Penicillins Rash  . Vancomycin Rash    ROS: A comprehensive review of systems was negative.  HOME MEDS: Current Outpatient Prescriptions  Medication Sig Dispense Refill  . Biotin 1000 MCG tablet Take 1,000 mcg by mouth daily.      . Calcium Carbonate Antacid (TUMS PO) Take 1,500 mg by mouth.        . Cholecalciferol (VITAMIN D-3) 5000 UNITS TABS Take 1 tablet by mouth daily.       . cyclobenzaprine (FLEXERIL) 10 MG tablet Take 10 mg by mouth 3 (three) times daily as needed.        Marland Kitchen FLUoxetine (PROZAC) 40 MG capsule Take 1 capsule (40 mg total) by mouth daily.  90 capsule  1  . hydrochlorothiazide (HYDRODIURIL) 25 MG tablet Take 12.5 mg by mouth daily.      Marland Kitchen HYDROcodone-acetaminophen (NORCO) 5-325 MG per tablet Take 1 tablet by mouth as needed.       . lubiprostone (AMITIZA) 24 MCG capsule Take 1 capsule (24 mcg total) by mouth 2 (two) times daily with a meal.  60 capsule  11  . morphine (MS CONTIN) 30 MG 12 hr tablet Take 30 mg by mouth 3 (three) times daily.      . pantoprazole (PROTONIX) 40 MG tablet Take 1 tablet (40 mg total) by mouth daily.  90 tablet  3  . SUMAtriptan (IMITREX) 100 MG tablet Take 1 tablet (100 mg total) by mouth every 2 (two) hours as needed for migraine.  27 tablet  3  . topiramate (TOPAMAX) 50 MG tablet Take 2 tablets (100 mg total) by mouth at bedtime.  90 tablet  3  . XARELTO 20 MG TABS tablet TAKE 1 TABLET (20 MG TOTAL) BY MOUTH DAILY WITH SUPPER.  30 tablet  5   No current facility-administered medications for this visit.    LABS/IMAGING: No results found for this or any previous visit (from the past 48 hour(s)). No results found.  VITALS: BP 122/78  Pulse 66  Ht  (1.727 m)  Wt 253 lb 6.4 oz (114.941 kg)  BMI 38.54 kg/m2  EXAM: General appearance: alert and no distress Neck: no carotid bruit and no JVD Lungs: clear to auscultation bilaterally Heart: regular rate and rhythm, S1, S2 normal, no murmur, click, rub or gallop Abdomen: soft, non-tender; bowel sounds normal; no masses,  no organomegaly Extremities: extremities normal, atraumatic, no cyanosis or edema Pulses: 2+ and symmetric Skin: Skin color, texture, turgor normal. No rashes or lesions Neurologic: Grossly normal Psych: Pleasant, mildly anxious  EKG: Normal sinus rhythm at 66  ASSESSMENT: 1. Recurrent DVT/PE on Xarelto 2. Spinal stenosis 3. Low risk for  spinal injections 4. Obesity status post bariatric surgery  PLAN: 1.   Ana Baker is doing very well. She's not having any bleeding problems on Xarelto. She's had some improvement in back pain with spinal injections. She continues to maintain her weight although has not been able to lose weight due to her mother being ill recently however she is better he hasn't Ana Baker is going to work on her weight. We will  plan to go ahead and check a lipid profile and a metabolic profile. Hopefully this will help her with enrollment in her health and wellness program through work. Plan to see her back annually or sooner as necessary.  Chrystie Nose, MD, [facility] Attending Cardiologist [facility]  Ana Baker 05/02/2014, 4:33 PM

## 2014-05-02 NOTE — Patient Instructions (Signed)
Your physician recommends that you return for lab work at your convenience. You will need to be fasting.   Your physician wants you to follow-up in: 1 year. You will receive a reminder letter in the mail two months in advance. If you don't receive a letter, please call our office to schedule the follow-up appointment.   

## 2014-05-04 LAB — COMPREHENSIVE METABOLIC PANEL
ALBUMIN: 4.2 g/dL (ref 3.5–5.2)
ALK PHOS: 52 U/L (ref 39–117)
ALT: 16 U/L (ref 0–35)
AST: 17 U/L (ref 0–37)
BUN: 14 mg/dL (ref 6–23)
CHLORIDE: 105 meq/L (ref 96–112)
CO2: 24 meq/L (ref 19–32)
Calcium: 9 mg/dL (ref 8.4–10.5)
Creat: 0.73 mg/dL (ref 0.50–1.10)
GLUCOSE: 74 mg/dL (ref 70–99)
POTASSIUM: 3.5 meq/L (ref 3.5–5.3)
SODIUM: 137 meq/L (ref 135–145)
TOTAL PROTEIN: 7.1 g/dL (ref 6.0–8.3)
Total Bilirubin: 0.4 mg/dL (ref 0.2–1.2)

## 2014-05-04 LAB — LIPID PANEL
Cholesterol: 208 mg/dL — ABNORMAL HIGH (ref 0–200)
HDL: 67 mg/dL (ref >39)
LDL Cholesterol: 120 mg/dL — ABNORMAL HIGH (ref 0–99)
Total CHOL/HDL Ratio: 3.1 Ratio
Triglycerides: 107 mg/dL (ref <150)
VLDL: 21 mg/dL (ref 0–40)

## 2014-05-10 ENCOUNTER — Telehealth: Payer: Self-pay | Admitting: *Deleted

## 2014-05-10 NOTE — Telephone Encounter (Signed)
Faxed [employer] Screening Results to number provided. Copy of results mailed to patient.   Form mailed to:  [facility] Attn: [employer] [id number] [place], [place] [phone]

## 2014-06-12 ENCOUNTER — Other Ambulatory Visit: Payer: Self-pay | Admitting: Internal Medicine

## 2014-11-29 ENCOUNTER — Telehealth: Payer: Self-pay | Admitting: *Deleted

## 2014-11-29 NOTE — Telephone Encounter (Signed)
Faxed PA for xarelto 20mg  QD to [facility]

## 2014-12-12 ENCOUNTER — Other Ambulatory Visit: Payer: Self-pay | Admitting: Internal Medicine

## 2015-01-02 NOTE — Telephone Encounter (Signed)
Xarelto approved from 11/14/2014 until 12/14/2015

## 2015-06-11 ENCOUNTER — Other Ambulatory Visit: Payer: Self-pay | Admitting: Internal Medicine

## 2015-06-22 ENCOUNTER — Encounter: Payer: Self-pay | Admitting: Internal Medicine

## 2015-06-22 ENCOUNTER — Ambulatory Visit (INDEPENDENT_AMBULATORY_CARE_PROVIDER_SITE_OTHER): Payer: Self-pay | Admitting: Internal Medicine

## 2015-06-22 VITALS — BP 122/80 | HR 65 | Ht 68.0 in | Wt 255.2 lb

## 2015-06-22 DIAGNOSIS — Z7901 Long term (current) use of anticoagulants: Secondary | ICD-10-CM

## 2015-06-22 DIAGNOSIS — M4806 Spinal stenosis, lumbar region: Secondary | ICD-10-CM | POA: Diagnosis not present

## 2015-06-22 DIAGNOSIS — M48061 Spinal stenosis, lumbar region without neurogenic claudication: Secondary | ICD-10-CM

## 2015-06-22 DIAGNOSIS — Z86711 Personal history of pulmonary embolism: Secondary | ICD-10-CM

## 2015-06-22 DIAGNOSIS — E669 Obesity, unspecified: Secondary | ICD-10-CM

## 2015-06-22 NOTE — Progress Notes (Signed)
OFFICE NOTE  Chief Complaint:  Routine follow-up  Primary Care Physician: Birdena Jubilee, MD  HPI:  Ana Baker is a pleasant 49 year old female presenting followed by Dr. Alanda Amass. Unfortunately she has a history of morbid obesity and is status post gastric bypass surgery. She's had a number of both knee surgeries and lumbar surgeries with a history of lumbar stenosis. In the past she developed a DVT after her first surgery in 1999 and then had pulmonary emboli. She was treated on warfarin for this but then had problems with kidney stones and then developed significant bleeding. After surgery in 2004 she had an IVC filter placed and was removed. She also had recurrent DVT in 2011. She was then switched from warfarin over 2 Xarelto. So far Xarelto has been working well for her and a prior discussion with Dr. Alanda Amass indicated that she may be able to come off of this, despite the fact that she's had 2 episodes of DVT and pulmonary emboli in the past. Today she wishes to discuss management of her Xarelto in the setting of possible spinal injections. She continues to have problems with low back pain and numbness and tingling in her right leg. There is a recommendation for injection however she will need to be off of Xarelto for this. Other than low back pain she has no other complaints.  Ana Baker returns today for followup. Overall she has no complaints. She reports that her primary care provider is going to leave of absence and moving out of the [facility] health system. She is interested in getting laboratory work today and enrolling in a health and wellness program through her work, they are requiring laboratory work within the past year. The last laboratory work that I have is from September of last year therefore she is due for a recheck.  I saw Ana Baker today back for an annual follow-up. Generally she is doing well with regards to anticoagulation. She's had no recurrent DVT or PE. She is  tolerating Xarelto without any bleeding difficulty. She continues to see Dr. Ethelene Hal for back pain. Unfortunately there are few options for her. She is ready had surgery and now is on long-acting morphine and Vicodin for pain control.  PMHx:  Past Medical History  Diagnosis Date  . History of kidney stones   . Hypertension   . DVT (deep venous thrombosis) 04/2010    and PE, in post-operative setting   . GERD (gastroesophageal reflux disease)     Past Surgical History  Procedure Laterality Date  . Cholecystectomy open  1990  . Total knee arthroplasty Right 82,98,99,00    DVT & PE post-op 1999  . Knee arthroscopy Left 06,07  . Kidney stone surgery  98,00  . Gastric bypass  07/31/2003    DVT post-op - IV filter  . Mass excision  03/12/04    right arm  . Bowel resection  03/22/04    sbo  . Repair internal hernia  09/09/04  . Knee arthroscopy Left 12/2009    Dr. Thomasena Edis  . Abdominal hysterectomy  2008  . Spine surgery  2009  . Joint replacement    . Transthoracic echocardiogram  03/2012    EF=>55%, trace MR & TR, normal RSVP  . Nm myocar perf wall motion  04/2010    dipyridamole; normal pattern of perfusion to all regions; EF 68%; low risk study     FAMHx:  Family History  Problem Relation Age of Onset  . Heart failure  Father   . Heart disease Father   . Diabetes Mother   . Hypertension Mother   . Heart disease Mother     CHF  . Hypertension Sister   . Colon cancer Maternal Aunt   . Lung cancer Maternal Uncle   . Lung cancer Paternal Uncle   . Lymphoma Maternal Grandfather   . Heart failure Maternal Grandmother   . Diabetes Paternal Grandfather   . Heart Problems Paternal Grandfather   . Alzheimer's disease Paternal Grandmother     SOCHx:   reports that she has never smoked. She has never used smokeless tobacco. She reports that she does not drink alcohol or use illicit drugs.  ALLERGIES:  Allergies  Allergen Reactions  . Almond Oil   . Nsaids Nausea Only  .  Penicillins Rash  . Vancomycin Rash    ROS: A comprehensive review of systems was negative except for: Musculoskeletal: positive for back pain  HOME MEDS: Current Outpatient Prescriptions  Medication Sig Dispense Refill  . Biotin 1000 MCG tablet Take 1,000 mcg by mouth daily.    . Calcium Carbonate Antacid (TUMS PO) Take 1,500 mg by mouth.      . Cholecalciferol (VITAMIN D-3) 5000 UNITS TABS Take 1 tablet by mouth daily.    . cyclobenzaprine (FLEXERIL) 10 MG tablet Take 10 mg by mouth 3 (three) times daily as needed.      . DULoxetine (CYMBALTA) 60 MG capsule Take 60 mg by mouth.    Marland Kitchen HYDROcodone-acetaminophen (NORCO) 5-325 MG per tablet Take 1 tablet by mouth as needed.     . lubiprostone (AMITIZA) 24 MCG capsule Take 1 capsule (24 mcg total) by mouth 2 (two) times daily with a meal. 60 capsule 11  . morphine (MS CONTIN) 30 MG 12 hr tablet Take 30 mg by mouth 3 (three) times daily.    . SUMAtriptan (IMITREX) 100 MG tablet Take 1 tablet (100 mg total) by mouth every 2 (two) hours as needed for migraine. 27 tablet 3  . Vitamin D, Ergocalciferol, (DRISDOL) 50000 UNITS CAPS capsule Take 50,000 Units by mouth every 14 (fourteen) days.    Carlena Hurl 20 MG TABS tablet TAKE 1 TABLET (20 MG TOTAL) BY MOUTH DAILY WITH SUPPER. 30 tablet 6  . hydrochlorothiazide (HYDRODIURIL) 25 MG tablet Take 12.5 mg by mouth daily.    . pantoprazole (PROTONIX) 40 MG tablet Take 1 tablet (40 mg total) by mouth daily. 90 tablet 3  . topiramate (TOPAMAX) 50 MG tablet Take 2 tablets (100 mg total) by mouth at bedtime. 90 tablet 3   No current facility-administered medications for this visit.    LABS/IMAGING: No results found for this or any previous visit (from the past 48 hour(s)). No results found.  VITALS: BP 122/80 mmHg  Pulse 65  Ht  (1.727 m)  Wt 255 lb 3.2 oz (115.758 kg)  BMI 38.81 kg/m2  EXAM: General appearance: alert and no distress Neck: no carotid bruit and no JVD Lungs: clear to  auscultation bilaterally Heart: regular rate and rhythm, S1, S2 normal, no murmur, click, rub or gallop Abdomen: soft, non-tender; bowel sounds normal; no masses,  no organomegaly Extremities: extremities normal, atraumatic, no cyanosis or edema Pulses: 2+ and symmetric Skin: Skin color, texture, turgor normal. No rashes or lesions Neurologic: Grossly normal Psych: Pleasant, mildly anxious  EKG: Normal sinus rhythm at 65  ASSESSMENT: 1. Recurrent DVT/PE on Xarelto 2. Spinal stenosis 3. Low risk for spinal injections 4. Obesity status post bariatric surgery  PLAN: 1.   Ana Baker is doing very well. She's not having any bleeding problems on Xarelto. Unfortunately she continues to struggle with back pain is now on long-acting opiates. I recommend she continue her Xarelto indefinitely. No other cardiac issues are active.  Ana NoseKenneth C. Carissa Musick, MD, [facility] Attending Cardiologist [facility]  Ana Baker 06/22/2015, 5:24 PM

## 2015-06-22 NOTE — Patient Instructions (Signed)
Your physician wants you to follow-up in: 1 year with Dr. Hilty. You will receive a reminder letter in the mail two months in advance. If you don't receive a letter, please call our office to schedule the follow-up appointment.  

## 2015-07-31 DIAGNOSIS — E559 Vitamin D deficiency, unspecified: Secondary | ICD-10-CM | POA: Insufficient documentation

## 2015-10-22 ENCOUNTER — Telehealth: Payer: Self-pay | Admitting: Internal Medicine

## 2015-10-22 NOTE — Telephone Encounter (Signed)
Call to work number unconnectable through [employer]. Message left on patient cell phone VM.

## 2015-10-22 NOTE — Telephone Encounter (Signed)
Ana Baker is calling because she thinks she has a clot in her legs . Please call   Thanks

## 2015-10-22 NOTE — Telephone Encounter (Signed)
Returned call to this patient regarding symptom of left inner thigh & calf swelling x1 week. She notes pain w/ swelling. Pt notes she saw ortho 2 weeks ago for a knee subluxation, but was not having this particular issue at the time.  She was concerned due to DVT history and notes similar to feeling she had when she had DVT. Pt notes compliance w/ Xarelto since last event.  Initially I recommended PCP follow up, she states her PCP "wouldn't know anything about my leg", recommended to re-see ortho about the issue. I added that due to hx would verify w/ Dr. Rennis Golden whether to resee [place] or if imaging recommended. Pt aware.

## 2015-10-22 NOTE — Telephone Encounter (Signed)
Mrs. Wahlquist is  On Xarelto as well

## 2015-10-22 NOTE — Telephone Encounter (Signed)
If she has been compliant with Xarelto, a new DVT would be highly unlikely. I agree with ortho follow-up.  Dr. Rexene Edison

## 2015-10-22 NOTE — Telephone Encounter (Signed)
Left msg for pt to call. 

## 2015-10-23 NOTE — Telephone Encounter (Signed)
Returned call. Pt saw ortho yesterday and had brace put on for knee subluxation, she informs me they believe problem is related to that and that they affirmed vascular issue not likely. She will call us if needed.

## 2015-10-23 NOTE — Telephone Encounter (Signed)
FOLLOW UP ° ° °Pt is returning call for rn °

## 2015-12-05 ENCOUNTER — Telehealth: Payer: Self-pay

## 2015-12-05 NOTE — Telephone Encounter (Signed)
Prior auth for Xarelto obtained from [employer]. Case ID #[id number].

## 2016-02-05 ENCOUNTER — Other Ambulatory Visit: Payer: Self-pay | Admitting: Internal Medicine

## 2016-08-04 ENCOUNTER — Other Ambulatory Visit: Payer: Self-pay | Admitting: Internal Medicine

## 2016-11-13 ENCOUNTER — Ambulatory Visit: Payer: BLUE CROSS/BLUE SHIELD | Admitting: Internal Medicine

## 2016-11-14 ENCOUNTER — Ambulatory Visit: Payer: BLUE CROSS/BLUE SHIELD | Admitting: Internal Medicine

## 2016-11-21 ENCOUNTER — Ambulatory Visit (INDEPENDENT_AMBULATORY_CARE_PROVIDER_SITE_OTHER): Payer: BLUE CROSS/BLUE SHIELD | Admitting: Internal Medicine

## 2016-11-21 ENCOUNTER — Encounter: Payer: Self-pay | Admitting: Internal Medicine

## 2016-11-21 VITALS — BP 103/71 | HR 64 | Ht 68.0 in | Wt 275.2 lb

## 2016-11-21 DIAGNOSIS — Z86711 Personal history of pulmonary embolism: Secondary | ICD-10-CM | POA: Diagnosis not present

## 2016-11-21 DIAGNOSIS — I82409 Acute embolism and thrombosis of unspecified deep veins of unspecified lower extremity: Secondary | ICD-10-CM

## 2016-11-21 DIAGNOSIS — Z7901 Long term (current) use of anticoagulants: Secondary | ICD-10-CM

## 2016-11-21 NOTE — Patient Instructions (Signed)
Medication Instructions: Your physician recommends that you continue on your current medications as directed. Please refer to the Current Medication list given to you today.   Follow-Up: Your physician recommends that you schedule a follow-up appointment in: 12 months with Dr. Rennis Golden   Any Additional Special Instructions Will Be Listed Below (If Applicable).  You have been referred to a Hematologist    If you need a refill on your cardiac medications before your next appointment, please call your pharmacy.

## 2016-11-24 ENCOUNTER — Telehealth: Payer: Self-pay | Admitting: Internal Medicine

## 2016-11-24 NOTE — Telephone Encounter (Signed)
PA for xarelto submitted via [email or web address]  [id number]; Status:Approved; Review Type:Prior Auth; Coverage Start Date:10/25/2016; Coverage End Date:11/24/2017;

## 2016-11-24 NOTE — Progress Notes (Signed)
OFFICE NOTE  Chief Complaint:  Follow-up DVT  Primary Care Physician: Birdena Jubilee, MD (Inactive)  HPI:  Ana Baker is a pleasant 51 year old female presenting followed by Dr. Alanda Amass. Unfortunately she has a history of morbid obesity and is status post gastric bypass surgery. She's had a number of both knee surgeries and lumbar surgeries with a history of lumbar stenosis. In the past she developed a DVT after her first surgery in 1999 and then had pulmonary emboli. She was treated on warfarin for this but then had problems with kidney stones and then developed significant bleeding. After surgery in 2004 she had an IVC filter placed and was removed. She also had recurrent DVT in 2011. She was then switched from warfarin over 2 Xarelto. So far Xarelto has been working well for her and a prior discussion with Dr. Alanda Amass indicated that she may be able to come off of this, despite the fact that she's had 2 episodes of DVT and pulmonary emboli in the past. Today she wishes to discuss management of her Xarelto in the setting of possible spinal injections. She continues to have problems with low back pain and numbness and tingling in her right leg. There is a recommendation for injection however she will need to be off of Xarelto for this. Other than low back pain she has no other complaints.  Ana Baker returns today for followup. Overall she has no complaints. She reports that her primary care provider is going to leave of absence and moving out of the [facility] health system. She is interested in getting laboratory work today and enrolling in a health and wellness program through her work, they are requiring laboratory work within the past year. The last laboratory work that I have is from September of last year therefore she is due for a recheck.  I saw Ana Baker today back for an annual follow-up. Generally she is doing well with regards to anticoagulation. She's had no recurrent DVT or PE.  She is tolerating Xarelto without any bleeding difficulty. She continues to see Dr. Ethelene Hal for back pain. Unfortunately there are few options for her. She is ready had surgery and now is on long-acting morphine and Vicodin for pain control.  11/24/2016  Ana Baker returns today for follow-up of her DVT. She's not had any recurrence and remains on Xarelto. She had had previous testing for clotting disorders in the past without a real identification of the etiology of her recurrent thrombosis. She says there is a family history of clot formation. She is wondering if she could ever come off of her anticoagulation.  PMHx:  Past Medical History:  Diagnosis Date  . DVT (deep venous thrombosis) (HCC) 04/2010   and PE, in post-operative setting   . GERD (gastroesophageal reflux disease)   . History of kidney stones   . Hypertension     Past Surgical History:  Procedure Laterality Date  . ABDOMINAL HYSTERECTOMY  2008  . BOWEL RESECTION  03/22/04   sbo  . CHOLECYSTECTOMY OPEN  1990  . GASTRIC BYPASS  07/31/2003   DVT post-op - IV filter  . JOINT REPLACEMENT    . KIDNEY STONE SURGERY  98,00  . KNEE ARTHROSCOPY Left 06,07  . KNEE ARTHROSCOPY Left 12/2009   Dr. Thomasena Edis  . MASS EXCISION  03/12/04   right arm  . NM MYOCAR PERF WALL MOTION  04/2010   dipyridamole; normal pattern of perfusion to all regions; EF 68%; low risk study   .  repair internal hernia  09/09/04  . SPINE SURGERY  2009  . TOTAL KNEE ARTHROPLASTY Right 82,98,99,00   DVT & PE post-op 1999  . TRANSTHORACIC ECHOCARDIOGRAM  03/2012   EF=>55%, trace MR & TR, normal RSVP    FAMHx:  Family History  Problem Relation Age of Onset  . Heart failure Father   . Heart disease Father   . Diabetes Mother   . Hypertension Mother   . Heart disease Mother     CHF  . Hypertension Sister   . Colon cancer Maternal Aunt   . Lung cancer Maternal Uncle   . Lung cancer Paternal Uncle   . Lymphoma Maternal Grandfather   . Heart failure Maternal  Grandmother   . Diabetes Paternal Grandfather   . Heart Problems Paternal Grandfather   . Alzheimer's disease Paternal Grandmother     SOCHx:   reports that she has never smoked. She has never used smokeless tobacco. She reports that she does not drink alcohol or use drugs.  ALLERGIES:  Allergies  Allergen Reactions  . Almond Oil   . Nsaids Nausea Only  . Penicillins Rash  . Vancomycin Rash    ROS: Pertinent items noted in HPI and remainder of comprehensive ROS otherwise negative.  HOME MEDS: Current Outpatient Prescriptions  Medication Sig Dispense Refill  . Dulaglutide (TRULICITY) 1.5 MG/0.5ML SOPN Inject 1.5 mg into the skin once a week.    . Biotin 1000 MCG tablet Take 1,000 mcg by mouth daily.    . Calcium Carbonate Antacid (TUMS PO) Take 1,500 mg by mouth.      . Cholecalciferol (VITAMIN D-3) 5000 UNITS TABS Take 1 tablet by mouth daily.    . cyclobenzaprine (FLEXERIL) 10 MG tablet Take 10 mg by mouth 3 (three) times daily as needed.      . DULoxetine (CYMBALTA) 60 MG capsule Take 60 mg by mouth.    . hydrochlorothiazide (HYDRODIURIL) 25 MG tablet Take 12.5 mg by mouth daily.    Marland Kitchen HYDROcodone-acetaminophen (NORCO) 5-325 MG per tablet Take 1 tablet by mouth as needed.     . lubiprostone (AMITIZA) 24 MCG capsule Take 1 capsule (24 mcg total) by mouth 2 (two) times daily with a meal. 60 capsule 11  . morphine (MS CONTIN) 30 MG 12 hr tablet Take 15 mg by mouth 3 (three) times daily.     . pantoprazole (PROTONIX) 40 MG tablet Take 1 tablet (40 mg total) by mouth daily. 90 tablet 3  . SUMAtriptan (IMITREX) 100 MG tablet Take 1 tablet (100 mg total) by mouth every 2 (two) hours as needed for migraine. 27 tablet 3  . topiramate (TOPAMAX) 50 MG tablet Take 2 tablets (100 mg total) by mouth at bedtime. 90 tablet 3  . XARELTO 20 MG TABS tablet TAKE 1 TABLET (20 MG TOTAL) BY MOUTH DAILY WITH SUPPER. 30 tablet 5   No current facility-administered medications for this visit.      LABS/IMAGING: No results found for this or any previous visit (from the past 48 hour(s)). No results found.  VITALS: BP 103/71   Pulse 64   Ht  (1.727 m)   Wt 275 lb 3.2 oz (124.8 kg)   BMI 41.84 kg/m   EXAM: General appearance: alert and no distress Neck: no carotid bruit and no JVD Lungs: clear to auscultation bilaterally Heart: regular rate and rhythm, S1, S2 normal, no murmur, click, rub or gallop Abdomen: soft, non-tender; bowel sounds normal; no masses,  no organomegaly Extremities:  extremities normal, atraumatic, no cyanosis or edema Pulses: 2+ and symmetric Skin: Skin color, texture, turgor normal. No rashes or lesions Neurologic: Grossly normal Psych: Pleasant, mildly anxious  EKG: Normal sinus rhythm at 64  ASSESSMENT: 1. Recurrent DVT/PE on Xarelto 2. Spinal stenosis 3. Low risk for spinal injections 4. Obesity status post bariatric surgery  PLAN: 1.   Ana Baker has been committed to lifelong Xarelto without clear evidence for her recurrent DVT and PE although has a family history of significant clot formation. She is interested in perhaps additional testing to see if she will need to stay on Xarelto long-term. One option might be low-dose Xarelto 10 mg daily which was recently approved. I would refer her to hematology for further evaluation of this. Follow-up with me annually.  Chrystie Nose, MD, [facility] Attending Cardiologist [facility]  Lisette Abu Bowen Kia 11/24/2016, 1:16 PM

## 2016-11-25 ENCOUNTER — Encounter: Payer: Self-pay | Admitting: Hematology and Oncology

## 2016-11-25 ENCOUNTER — Telehealth: Payer: Self-pay | Admitting: Hematology and Oncology

## 2016-11-25 NOTE — Telephone Encounter (Signed)
Received a call from [facility] at [facility] to schedule the pt an appt to see Dr. Pamelia Hoit on 5/9 at 1pm. She will notify the pt of the appt. Letter mailed.

## 2016-12-31 ENCOUNTER — Ambulatory Visit (HOSPITAL_BASED_OUTPATIENT_CLINIC_OR_DEPARTMENT_OTHER): Payer: BLUE CROSS/BLUE SHIELD | Admitting: Hematology and Oncology

## 2016-12-31 ENCOUNTER — Encounter: Payer: Self-pay | Admitting: Hematology and Oncology

## 2016-12-31 DIAGNOSIS — Z86711 Personal history of pulmonary embolism: Secondary | ICD-10-CM

## 2016-12-31 DIAGNOSIS — Z7901 Long term (current) use of anticoagulants: Secondary | ICD-10-CM

## 2016-12-31 DIAGNOSIS — Z86718 Personal history of other venous thrombosis and embolism: Secondary | ICD-10-CM

## 2016-12-31 NOTE — Assessment & Plan Note (Signed)
Regarding DVT and PE  1999: DVT and PE treated with warfarin, had kidney stones and no significant bleeding 2011: Recurrent DVT: Switched from warfarin to Xarelto Obesity, chronic back problems requires injections Apparently previous workup for hypercoagulability was negative.  Risk factors for recurrent clots: Obesity, decreased mobility Recommendation: Lifelong anticoagulation When she undergoes procedures she needs to stop Xarelto for 48 hours prior to the procedure. She can resume it the day after the procedure.

## 2016-12-31 NOTE — Progress Notes (Signed)
[facility] CONSULT NOTE  Patient Care Team: Birdena JubileeZanard, Robyn, MD (Inactive) as PCP - General  CHIEF COMPLAINTS/PURPOSE OF CONSULTATION:  Recurrent DVT/PE  HISTORY OF PRESENTING ILLNESS:  Ana Baker 51 y.o. female is here because of history of recurrent DVT and PE. Review of the notes indicate that the patient had morbid obesity and had prior gastric bypass surgery. She has had multiple surgeries on her back and her knees. In 1999 she developed a fast blood clot along with pulmonary embolism. She had a second blood clot DVT in 2011. Initially she was treated with warfarin but apparently she had bleeding issues. She is been on Xarelto since 2011. On Xarelto she has not had any further blood clots. She is also tolerating it fairly well without any bleeding symptoms. Patient lost 170 pounds from gastric bypass surgery but had gained some and is working very hard to lose weight again. She is working with [employer].  I reviewed her records extensively and collaborated the history with the patient.  MEDICAL HISTORY:  Past Medical History:  Diagnosis Date  . DVT (deep venous thrombosis) (HCC) 04/2010   and PE, in post-operative setting   . GERD (gastroesophageal reflux disease)   . History of kidney stones   . Hypertension     SURGICAL HISTORY: Past Surgical History:  Procedure Laterality Date  . ABDOMINAL HYSTERECTOMY  2008  . BOWEL RESECTION  03/22/04   sbo  . CHOLECYSTECTOMY OPEN  1990  . GASTRIC BYPASS  07/31/2003   DVT post-op - IV filter  . JOINT REPLACEMENT    . KIDNEY STONE SURGERY  98,00  . KNEE ARTHROSCOPY Left 06,07  . KNEE ARTHROSCOPY Left 12/2009   Dr. Thomasena Edisollins  . MASS EXCISION  03/12/04   right arm  . NM MYOCAR PERF WALL MOTION  04/2010   dipyridamole; normal pattern of perfusion to all regions; EF 68%; low risk study   . repair internal hernia  09/09/04  . SPINE SURGERY  2009  . TOTAL KNEE ARTHROPLASTY Right 82,98,99,00   DVT & PE post-op 1999  .  TRANSTHORACIC ECHOCARDIOGRAM  03/2012   EF=>55%, trace MR & TR, normal RSVP    SOCIAL HISTORY: Social History   Social History  . Marital status: Single    Spouse name: N/A  . Number of children: N/A  . Years of education: 716   Occupational History  .  [employer]   Social History Main Topics  . Smoking status: Never Smoker  . Smokeless tobacco: Never Used  . Alcohol use No  . Drug use: No  . Sexual activity: Not Currently   Other Topics Concern  . 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 Heart failure Father   . Heart disease Father   . Diabetes Mother   . Hypertension Mother   . Heart disease Mother     CHF  . Hypertension Sister   . Colon cancer Maternal Aunt   . Lung cancer Maternal Uncle   . Lung  cancer Paternal Uncle   . Lymphoma Maternal Grandfather   . Heart failure Maternal Grandmother   . Diabetes Paternal Grandfather   . Heart Problems Paternal Grandfather   . Alzheimer's disease Paternal Grandmother     ALLERGIES:  is allergic to almond oil; nsaids; penicillins; and vancomycin.  MEDICATIONS:  Current Outpatient Prescriptions  Medication Sig Dispense Refill  . Cholecalciferol (VITAMIN D-3) 5000 UNITS TABS Take 1 tablet by mouth daily.    . cyclobenzaprine (FLEXERIL) 10 MG tablet Take 10 mg by mouth 3 (three) times daily as needed.      Marland Kitchen HYDROcodone-acetaminophen (NORCO) 5-325 MG per tablet Take 1 tablet by mouth as needed.     . topiramate (TOPAMAX) 50 MG tablet Take 2 tablets (100 mg total) by mouth at bedtime. 90 tablet 3  . Biotin 1000 MCG tablet  Take 1,000 mcg by mouth daily.    . Calcium Carbonate Antacid (TUMS PO) Take 1,500 mg by mouth.      . Dulaglutide (TRULICITY) 1.5 MG/0.5ML SOPN Inject 1.5 mg into the skin once a week.    . DULoxetine (CYMBALTA) 60 MG capsule Take 60 mg by mouth daily.     . hydrochlorothiazide (HYDRODIURIL) 25 MG tablet Take 12.5 mg by mouth daily.    Marland Kitchen lubiprostone (AMITIZA) 24 MCG capsule Take 1 capsule (24 mcg total) by mouth 2 (two) times daily with a meal. 60 capsule 11  . morphine (MS CONTIN) 15 MG 12 hr tablet Take 15 mg by mouth 3 (three) times daily.     . pantoprazole (PROTONIX) 40 MG tablet Take 1 tablet (40 mg total) by mouth daily. 90 tablet 3  . SUMAtriptan (IMITREX) 100 MG tablet Take 1 tablet (100 mg total) by mouth every 2 (two) hours as needed for migraine. 27 tablet 3  . XARELTO 20 MG TABS tablet TAKE 1 TABLET (20 MG TOTAL) BY MOUTH DAILY WITH SUPPER. 30 tablet 5   No current facility-administered medications for this visit.     REVIEW OF SYSTEMS:   Constitutional: Denies fevers, chills or abnormal night sweats Eyes: Denies blurriness of vision, double vision or watery eyes Ears, nose, mouth, throat, and face: Denies mucositis or sore throat Respiratory: Denies cough, dyspnea or wheezes Cardiovascular: Denies palpitation, chest discomfort or lower extremity swelling Gastrointestinal:  Denies nausea, heartburn or change in bowel habits, Obesity Skin: Denies abnormal skin rashes Lymphatics: Denies new lymphadenopathy or easy bruising Neurological:Denies numbness, tingling or new weaknesses Behavioral/Psych: Mood is stable, no new changes  All other systems were reviewed with the patient and are negative.  PHYSICAL EXAMINATION: ECOG PERFORMANCE STATUS: 1 - Symptomatic but completely ambulatory  Vitals:   12/31/16 1305  BP: (!) 112/58  Pulse: 79  Temp: 98.3 F (36.8 C)   Filed Weights   12/31/16 1305  Weight: 265 lb 1.6 oz (120.2 kg)    GENERAL:alert, no distress and  comfortable SKIN: skin color, texture, turgor are normal, no rashes or significant lesions EYES: normal, conjunctiva are pink and non-injected, sclera clear OROPHARYNX:no exudate, no erythema and lips, buccal mucosa, and tongue normal  NECK: supple, thyroid normal size, non-tender, without nodularity LYMPH:  no palpable lymphadenopathy in the cervical, axillary or inguinal LUNGS: clear to auscultation and percussion with normal breathing effort HEART: regular rate & rhythm and no murmurs and no lower extremity edema ABDOMEN:abdomen soft, non-tender and normal bowel sounds Musculoskeletal:no cyanosis of digits and no clubbing  PSYCH: alert & oriented x 3 with fluent speech NEURO: no focal motor/sensory  deficits  LABORATORY DATA:  I have reviewed the data as listed Lab Results  Component Value Date   WBC 4.4 05/16/2013   HGB 12.5 05/16/2013   HCT 37.5 05/16/2013   MCV 84.8 05/16/2013   PLT 367 05/16/2013   Lab Results  Component Value Date   NA 137 05/02/2014   K 3.5 05/02/2014   CL 105 05/02/2014   CO2 24 05/02/2014    ASSESSMENT AND PLAN:  Hx pulmonary embolism Regarding DVT and PE  1999: DVT and PE treated with warfarin, had kidney stones and no significant bleeding 2011: Recurrent DVT: Switched from warfarin to Xarelto Obesity, chronic back problems requires injections Apparently previous workup for hypercoagulability was negative.  Risk factors for recurrent clots: Obesity (with the weight loss from gastric bypass surgery, this risk factor has been mostly mitigated) Recommendation:  1. Lifelong anticoagulation which is acceptable to the patient. 2. we discussed repeating hypercoagulability panel. However it does not change our treatment plan and may not help her or her family in any significant way. So we elected not to do the workup at this time. Previous workup was apparently normal. 3. Patient wanted to know Xarelto is the right medication for her. I discussed with  her that current data suggests that Xarelto may be superior to warfarin. She cannot take warfarin because of fluctuation off the INR as well as the intra-abdominal bleeding previously. It has not been studied in extremes of weight. But her weight currently is reasonable and hence I believe this is the right medication for her. 4. When she undergoes procedures she needs to stop Xarelto for 48 hours prior to the procedure. She can resume it the day after the procedure. However if she undergoes major procedures, she will need to be on Lovenox pre-and postoperatively before transitioning to Xarelto.  Gastric bypass surgery: I discussed with the patient that she needs to have blood work periodically to review for iron deficiency and B-12 deficiency. She gets this workup with her primary care physician. If she were to become iron deficient, she will need IV iron therapy. I would be happy to see her if that was necessary.  All questions were answered. The patient knows to call the clinic with any problems, questions or concerns.    Sabas Sous, MD 12/31/16

## 2017-02-01 ENCOUNTER — Other Ambulatory Visit: Payer: Self-pay | Admitting: Internal Medicine

## 2017-02-02 NOTE — Telephone Encounter (Signed)
CBC and BMP 10/2016 - Care Everywhere

## 2017-06-30 ENCOUNTER — Other Ambulatory Visit: Payer: Self-pay | Admitting: Internal Medicine

## 2017-07-08 ENCOUNTER — Other Ambulatory Visit (HOSPITAL_COMMUNITY): Payer: Self-pay | Admitting: Specialist

## 2017-07-08 DIAGNOSIS — Z96651 Presence of right artificial knee joint: Secondary | ICD-10-CM

## 2017-07-09 ENCOUNTER — Other Ambulatory Visit: Payer: Self-pay

## 2017-07-09 ENCOUNTER — Encounter: Payer: Self-pay | Admitting: Physical Therapy

## 2017-07-09 ENCOUNTER — Ambulatory Visit: Payer: BLUE CROSS/BLUE SHIELD | Attending: Specialist | Admitting: Physical Therapy

## 2017-07-09 DIAGNOSIS — M25562 Pain in left knee: Secondary | ICD-10-CM | POA: Diagnosis not present

## 2017-07-09 DIAGNOSIS — M6281 Muscle weakness (generalized): Secondary | ICD-10-CM

## 2017-07-09 DIAGNOSIS — R29898 Other symptoms and signs involving the musculoskeletal system: Secondary | ICD-10-CM

## 2017-07-09 DIAGNOSIS — G8929 Other chronic pain: Secondary | ICD-10-CM | POA: Diagnosis present

## 2017-07-09 DIAGNOSIS — R2689 Other abnormalities of gait and mobility: Secondary | ICD-10-CM | POA: Insufficient documentation

## 2017-07-09 NOTE — Therapy (Addendum)
[facility] [address]  [place], [place], [address] Phone: [phone]   Fax:  [phone]  Physical Therapy Evaluation  Patient Details  Name: Ana Baker MRN: [id number] Date of Birth: Oct 07, 1965 Referring Provider: Dr. Hart Robinsons   Encounter Date: 07/09/2017  PT End of Session - 07/09/17 1720    Visit Number  1    Number of Visits  8    Date for PT Re-Evaluation  08/06/17    PT Start Time  1618    PT Stop Time  1702    PT Time Calculation (min)  44 min    Activity Tolerance  Patient tolerated treatment well    Behavior During Therapy  [facility] for tasks assessed/performed       Past Medical History:  Diagnosis Date  . DVT (deep venous thrombosis) ([place]) 04/2010   and PE, in post-operative setting   . GERD (gastroesophageal reflux disease)   . History of kidney stones   . Hypertension     Past Surgical History:  Procedure Laterality Date  . ABDOMINAL HYSTERECTOMY  2008  . BOWEL RESECTION  03/22/04   sbo  . CHOLECYSTECTOMY OPEN  1990  . GASTRIC BYPASS  07/31/2003   DVT post-op - IV filter  . JOINT REPLACEMENT    . KIDNEY STONE SURGERY  98,00  . KNEE ARTHROSCOPY Left 06,07  . KNEE ARTHROSCOPY Left 12/2009   Dr. Theda Sers  . MASS EXCISION  03/12/04   right arm  . NM MYOCAR PERF WALL MOTION  04/2010   dipyridamole; normal pattern of perfusion to all regions; EF 68%; low risk study   . repair internal hernia  09/09/04  . [place]  2009  . TOTAL KNEE ARTHROPLASTY Right 82,98,99,00   DVT & PE post-op 1999  . TRANSTHORACIC ECHOCARDIOGRAM  03/2012   EF=>55%, trace MR & TR, normal RSVP    There were no vitals filed for this visit.   Subjective Assessment - 07/09/17 1707    Subjective  Patient reporting repeated subluxation/dislocation of L patella and chronic L knee pain. Patient has had multiple surgeries to both knees since being in her 40s and has been having knee pain for many years. Patient  reports pain with all activities including sitting, standing and walking, however she tries to disregard the pain in order to do her job and work and care for her elderly mother at home. Patient reports frequent subluxation of L patella once a week at minimum and has typically "put it back in place" herself. Patient has seen several doctors regarding knee injuries in the past and continues to deal with [facility] for follow ups.      Pertinent History  clotting disorder, R TKA    Limitations  Sitting;Standing;Walking;House hold activities    Patient Stated Goals  to be able to ride a bike    Currently in Pain?  Yes    Pain Score  4     Pain Location  Knee    Pain Orientation  Left    Pain Descriptors / Indicators  Aching    Pain Type  Chronic pain    Pain Onset  More than a month ago    Pain Frequency  Constant    Aggravating Factors   prolonged sitting, walking         [facility] PT Assessment - 07/09/17 1713      Posture/Postural Control   Posture/Postural Control  --  Postural Limitations  --             Objective measurements completed on examination: See above findings.      [facility] Adult PT Treatment/Exercise - 07/09/17 1713      Exercises   Exercises  Knee/Hip      Knee/Hip Exercises: Seated   Long Arc Quad  Left;10 reps    Long Arc Quad Limitations  red tband; ball squeeze      Knee/Hip Exercises: Supine   Straight Leg Raises  Left;10 reps    Straight Leg Raise with External Rotation  Left;10 reps             PT Education - 07/09/17 1720    Education provided  Yes    Education Details  exam findings, HEP, POC    Person(s) Educated  Patient    Methods  Explanation;Demonstration;Handout    Comprehension  Verbalized understanding;Returned demonstration       PT Short Term Goals - 07/09/17 1730      PT SHORT TERM GOAL #1   Title  Patient to be independent with initial HEP    Status  New    Target Date  07/23/17        PT Long Term  Goals - 07/09/17 1730      PT LONG TERM GOAL #1   Title  Patient to be independent with advanced HEP    Status  New    Target Date  08/06/17      PT LONG TERM GOAL #2   Title  Patient to demonstrate B LE strength >/= 4+/5 to improve functional mobility    Status  New    Target Date  08/06/17      PT LONG TERM GOAL #3   Title  Patient to report ability to sit for >/= 1 hour at work with pain no greater than 2/10    Status  New    Target Date  08/06/17      PT LONG TERM GOAL #4   Title  Patient to initiate walking program for >/= 30 minutes for >/= 3 days per week     Status  New    Target Date  08/06/17             Plan - 07/09/17 1721    Clinical Impression Statement  Patient is 51 y.o female presenting to [facility] for primary complaint of L knee pain with associated feelings of instability/subluxation at L patella. Patient has history of knee trauma/surgery on B LE and is being followed by multiple physicians for these issues. Patient demonstrating general weakness in all planes in B LE, likely contributing to overall pain patterns. Patient is limited due to pain in majority of daily acitvities and performs usual activities with caution for fear of dislocating patella. Patient would benefit from skilled PT services in order to increase overall LE strength, increase mobility and decrease pain for improved patient function and QOL.     History and Personal Factors relevant to plan of care:  blood clotting disorder, R TKA    Clinical Presentation  Evolving    Clinical Presentation due to:  blood clotting disorder, R TKA, tendon graft to L knee    Clinical Decision Making  Moderate    Rehab Potential  Good    PT Frequency  2x / week    PT Duration  4 weeks    PT Treatment/Interventions  ADLs/Self Care Home Management;Cryotherapy;Iontophoresis 27m/ml Dexamethasone;Moist Heat;Ultrasound;Therapeutic exercise;Therapeutic activities;Functional  mobility training;Stair training;Gait  training;Neuromuscular re-education;Patient/family education;Passive range of motion;Manual techniques;Dry needling;Taping;Vasopneumatic Device;Geologist, engineering and Agree with Plan of Care  Patient       Patient will benefit from skilled therapeutic intervention in order to improve the following deficits and impairments:  Pain, Decreased activity tolerance, Decreased endurance, Decreased strength, Difficulty walking, Decreased mobility  Visit Diagnosis: Chronic pain of left knee - Plan: PT plan of care cert/re-cert  Other abnormalities of gait and mobility - Plan: PT plan of care cert/re-cert  Other symptoms and signs involving the musculoskeletal system - Plan: PT plan of care cert/re-cert  Muscle weakness (generalized) - Plan: PT plan of care cert/re-cert     Problem List Patient Active Problem List   Diagnosis Date Noted  . DVT, lower extremity ([place]) 10/07/2013  . Hx pulmonary embolism 10/07/2013  . Spinal stenosis at L4-L5 level 10/07/2013  . Obesity (BMI 35.0-39.9 without comorbidity) 10/07/2013  . Chronic anticoagulation 10/07/2013  . Abdominal pain, acute 03/21/2011     Mickle Asper, SPT 07/09/17 5:47 PM   PHYSICAL THERAPY DISCHARGE SUMMARY  Visits from Start of Care: 1  Current functional level related to goals / functional outcomes: See above; unable to assess progress as patient did not return following initial evaluation. Patient calling requesting d/c per MD advice.   Remaining deficits: See above   Education / Equipment: HEP  Plan: Patient agrees to discharge.  Patient goals were not met. Patient is being discharged due to not returning since the last visit.  ?????    Lanney Gins, PT, DPT 08/12/17 3:36 PM   [facility] [address]  [place], [place], [address] Phone: [phone]   Fax:  [phone]  Name: Ana Baker MRN:  [id number] Date of Birth: [birth year]

## 2017-07-09 NOTE — Patient Instructions (Signed)
Straight Leg Raise: With External Leg Rotation    Lie on back with left leg straight, opposite leg bent. Rotate straight leg out and lift. Repeat _10___ times per set. Do __2__ sets per session.      Straight Leg Raise    Tighten stomach and slowly raise locked left leg. Repeat __10__ times per set. Do _2___ sets per session.      Long Arc Quad    Straighten left leg and try to hold it __3__ seconds. Use yellow band on ankle. Repeat __10__ times. Do _2_ sets per session.

## 2017-07-20 ENCOUNTER — Encounter (HOSPITAL_COMMUNITY)
Admission: RE | Admit: 2017-07-20 | Discharge: 2017-07-20 | Disposition: A | Discharge status: Home / Self Care | Payer: BLUE CROSS/BLUE SHIELD | Source: Ambulatory Visit | Attending: Specialist | Admitting: Specialist

## 2017-07-20 DIAGNOSIS — Z96651 Presence of right artificial knee joint: Secondary | ICD-10-CM | POA: Insufficient documentation

## 2017-07-20 MED ORDER — TECHNETIUM TC 99M MEDRONATE IV KIT
21.5000 | PACK | Freq: Once | INTRAVENOUS | Status: AC | PRN
  Administered 2017-07-20: 21.5 via INTRAVENOUS

## 2017-07-21 ENCOUNTER — Ambulatory Visit: Payer: BLUE CROSS/BLUE SHIELD | Admitting: Physical Therapy

## 2017-07-23 ENCOUNTER — Ambulatory Visit: Payer: BLUE CROSS/BLUE SHIELD | Admitting: Physical Therapy

## 2017-07-27 ENCOUNTER — Ambulatory Visit: Payer: BLUE CROSS/BLUE SHIELD | Admitting: Physical Therapy

## 2017-07-29 ENCOUNTER — Ambulatory Visit: Payer: BLUE CROSS/BLUE SHIELD

## 2017-08-03 ENCOUNTER — Ambulatory Visit: Payer: BLUE CROSS/BLUE SHIELD | Admitting: Physical Therapy

## 2017-08-05 ENCOUNTER — Ambulatory Visit: Payer: BLUE CROSS/BLUE SHIELD | Admitting: Physical Therapy

## 2017-08-10 ENCOUNTER — Ambulatory Visit: Payer: BLUE CROSS/BLUE SHIELD | Admitting: Physical Therapy

## 2017-08-12 ENCOUNTER — Ambulatory Visit: Payer: BLUE CROSS/BLUE SHIELD

## 2017-11-23 ENCOUNTER — Telehealth: Payer: Self-pay | Admitting: Internal Medicine

## 2017-11-23 NOTE — Telephone Encounter (Signed)
Ana Baker Key: [id number] - [place] Case ID: [id number]: Approved today  Case Id: [id number] Status: Approved Review Type: Prior Auth Coverage Start Date: 10/24/2017 Coverage End Date: 11/23/2018  Patient notified via MyChart message + also that she is due for 1 year visit

## 2017-12-21 ENCOUNTER — Other Ambulatory Visit: Payer: Self-pay | Admitting: Internal Medicine

## 2018-01-25 ENCOUNTER — Telehealth: Payer: Self-pay | Admitting: Internal Medicine

## 2018-01-25 NOTE — Telephone Encounter (Signed)
New Message     *STAT* If patient is at the pharmacy, call can be transferred to refill team.   1. Which medications need to be refilled? (please list name of each medication and dose if known) rivaroxaban (XARELTO) 20 MG TABS tablet 2. Which pharmacy/location (including street and city if local pharmacy) is medication to be sent to? [facility] [place], [place] - [id number]  3. Do they need a 30 day or 90 day supply? 30

## 2018-01-26 MED ORDER — RIVAROXABAN 20 MG PO TABS: ORAL_TABLET | ORAL | 0 refills | Status: DC

## 2018-01-28 ENCOUNTER — Other Ambulatory Visit: Payer: Self-pay | Admitting: Internal Medicine

## 2018-01-28 MED ORDER — RIVAROXABAN 20 MG PO TABS: ORAL_TABLET | ORAL | 0 refills | Status: DC

## 2018-01-28 NOTE — Telephone Encounter (Signed)
New Message ° ° ° ° °*STAT* If patient is at the pharmacy, call can be transferred to refill team. ° ° °1. Which medications need to be refilled? (please list name of each medication and dose if known) rivaroxaban (XARELTO) 20 MG TABS tablet °2. Which pharmacy/location (including street and city if local pharmacy) is medication to be sent to? [facility] - [place], [place] - [address] ° °3. Do they need a 30 day or 90 day supply? 30  ° ° °

## 2018-01-28 NOTE — Telephone Encounter (Signed)
Rx request sent to pharmacy.  

## 2018-02-16 NOTE — Progress Notes (Signed)
Cardiology Office Note   Date:  02/17/2018   ID:  SERAYAH YAZDANI, DOB Oct 05, 1965, MRN [id number]  PCP:  Birdena Jubilee, MD (Inactive)  Cardiologist:  Chief Complaint  Patient presents with  . Follow-up     History of Present Illness: Ana Baker is a 52 y.o. female who presents for ongoing assessment and management of recurrent DVT/PE, anticoagulated with Xarelto.  She was last seen in the office on 11/21/2016 by Dr. Rennis Golden with no changes in her medication regimen.  Other history includes GERD, history of kidney stones, and hypertension, and gastric bypass surgery.  Significant improvement in BNP.   She comes today for one year follow up She has had no new diagnosis, hospitalizations, or surgeries. She is medically compliant.      Past Medical History:  Diagnosis Date  . DVT (deep venous thrombosis) ([facility]) 04/2010   and PE, in post-operative setting   . GERD (gastroesophageal reflux disease)   . History of kidney stones   . Hypertension     Past Surgical History:  Procedure Laterality Date  . ABDOMINAL HYSTERECTOMY  2008  . BOWEL RESECTION  03/22/04   sbo  . CHOLECYSTECTOMY OPEN  1990  . GASTRIC BYPASS  07/31/2003   DVT post-op - IV filter  . JOINT REPLACEMENT    . KIDNEY STONE SURGERY  98,00  . KNEE ARTHROSCOPY Left 06,07  . KNEE ARTHROSCOPY Left 12/2009   Dr. Thomasena Edis  . MASS EXCISION  03/12/04   right arm  . NM MYOCAR PERF WALL MOTION  04/2010   dipyridamole; normal pattern of perfusion to all regions; EF 68%; low risk study   . repair internal hernia  09/09/04  . SPINE SURGERY  2009  . TOTAL KNEE ARTHROPLASTY Right 82,98,99,00   DVT & PE post-op 1999  . TRANSTHORACIC ECHOCARDIOGRAM  03/2012   EF=>55%, trace MR & TR, normal RSVP     Current Outpatient Medications  Medication Sig Dispense Refill  . Cholecalciferol (VITAMIN D-3) 5000 UNITS TABS Take 1 tablet by mouth daily.    . cyclobenzaprine (FLEXERIL) 10 MG tablet Take 10 mg by mouth 3 (three) times daily as needed.       . famotidine (PEPCID) 40 MG tablet Take 1 tablet by mouth at bedtime.  3  . FLUoxetine (PROZAC) 40 MG capsule Take 40 mg daily by mouth.    Marland Kitchen HYDROcodone-acetaminophen (NORCO) 5-325 MG per tablet Take 1 tablet by mouth as needed.     . lubiprostone (AMITIZA) 24 MCG capsule Take 1 capsule (24 mcg total) by mouth 2 (two) times daily with a meal. 60 capsule 11  . morphine (MS CONTIN) 15 MG 12 hr tablet Take 15 mg by mouth 3 (three) times daily.     . pantoprazole (PROTONIX) 40 MG tablet Take 1 tablet by mouth 2 (two) times daily.    . rivaroxaban (XARELTO) 20 MG TABS tablet Take 1 tablet by mouth daily with supper 30 tablet 11  . SUMAtriptan (IMITREX) 100 MG tablet Take 1 tablet (100 mg total) by mouth every 2 (two) hours as needed for migraine. 27 tablet 3  . hydrochlorothiazide (HYDRODIURIL) 25 MG tablet Take 12.5 mg by mouth daily.    Marland Kitchen topiramate (TOPAMAX) 50 MG tablet Take 2 tablets (100 mg total) by mouth at bedtime. 90 tablet 3   No current facility-administered medications for this visit.     Allergies:   Almond oil; Nsaids; Penicillins; Tolmetin; and Vancomycin    Social History:  The  patient  reports that she has never smoked. She has never used smokeless tobacco. She reports that she does not drink alcohol or use drugs.   Family History:  The patient's family history includes Alzheimer's disease in her paternal grandmother; Colon cancer in her maternal aunt; Diabetes in her mother and paternal grandfather; Heart Problems in her paternal grandfather; Heart disease in her father and mother; Heart failure in her father and maternal grandmother; Hypertension in her mother and sister; Lung cancer in her maternal uncle and paternal uncle; Lymphoma in her maternal grandfather.    ROS: All other systems are reviewed and negative. Unless otherwise mentioned in H&P    PHYSICAL EXAM: VS:  BP 118/80   Pulse 79   Ht 5\' 8"  (1.727 m)   Wt 279 lb 12.8 oz (126.9 kg)   BMI 42.54 kg/m  , BMI  Body mass index is 42.54 kg/m. GEN: Well nourished, well developed, in no acute distress  HEENT: normal  Neck: no JVD, carotid bruits, or masses Cardiac: RRR; no murmurs, rubs, or gallops,no edema  Respiratory:  clear to auscultation bilaterally, normal work of breathing GI: soft, nontender, nondistended, + BS MS: no deformity or atrophy  Skin: warm and dry, no rash Neuro:  Strength and sensation are intact Psych: euthymic mood, full affect   EKG:  Not completed during this office visit.   Recent Labs: No results found for requested labs within last 8760 hours.    Lipid Panel    Component Value Date/Time   CHOL 208 (H) 05/02/2014 0826   TRIG 107 05/02/2014 0826   HDL 67 05/02/2014 0826   CHOLHDL 3.1 05/02/2014 0826   VLDL 21 05/02/2014 0826   LDLCALC 120 (H) 05/02/2014 0826      Wt Readings from Last 3 Encounters:  02/17/18 279 lb 12.8 oz (126.9 kg)  12/31/16 265 lb 1.6 oz (120.2 kg)  11/21/16 275 lb 3.2 oz (124.8 kg)      Other studies Reviewed: None   ASSESSMENT AND PLAN:  1.  Chronic Bilateral PE and DVT:s: She remains on Xarelto. She has no bleeding issues, chest pain, melena or hemoptysis. She is medically compliant. She sees her PCP every 6 months for labs.   2. Chronic Lower Back Pain: She has spinal injections through her orthopedic physician.   3. Hx GERD: Followed by PCP with PPI  4. Hypertension:  Hydralazine continues at 12.5 mg daily and is well controlled.    Current medicines are reviewed at length with the patient today.    Labs/ tests ordered today include: None    Bettey MareKathryn M. Liborio NixonLawrence DNP, ANP, AACC   02/17/2018 4:24 PM    [facility] [address], [place], [place] [address] Phone: [phone]; Fax: [phone]

## 2018-02-17 ENCOUNTER — Encounter: Payer: Self-pay | Admitting: Adult Health

## 2018-02-17 ENCOUNTER — Ambulatory Visit: Payer: BLUE CROSS/BLUE SHIELD | Admitting: Adult Health

## 2018-02-17 ENCOUNTER — Other Ambulatory Visit: Payer: Self-pay | Admitting: Internal Medicine

## 2018-02-17 VITALS — BP 118/80 | HR 79 | Ht 68.0 in | Wt 279.8 lb

## 2018-02-17 DIAGNOSIS — I10 Essential (primary) hypertension: Secondary | ICD-10-CM

## 2018-02-17 DIAGNOSIS — I82409 Acute embolism and thrombosis of unspecified deep veins of unspecified lower extremity: Secondary | ICD-10-CM

## 2018-02-17 DIAGNOSIS — Z86711 Personal history of pulmonary embolism: Secondary | ICD-10-CM | POA: Diagnosis not present

## 2018-02-17 MED ORDER — RIVAROXABAN 20 MG PO TABS: ORAL_TABLET | ORAL | 11 refills | Status: DC

## 2018-02-17 NOTE — Telephone Encounter (Signed)
New message:        *STAT* If patient is at the pharmacy, call can be transferred to refill team.   1. Which medications need to be refilled? (please list name of each medication and dose if known) rivaroxaban (XARELTO) 20 MG TABS tablet  2. Which pharmacy/location (including street and city if local pharmacy) is medication to be sent to?[facility] [place] - [id number]  3. Do they need a 30 day or 90 day supply? 30

## 2018-02-17 NOTE — Patient Instructions (Signed)
Medication Instructions:  NO CHANGES- Your physician recommends that you continue on your current medications as directed. Please refer to the Current Medication list given to you today.  Follow-Up: Your physician wants you to follow-up in: 12 MONTHS WITH DR HILTY You should receive a reminder letter in the mail two months in advance. If you do not receive a letter, please call our office MAY 2020 to schedule the July 2020 follow-up appointment.   Thank you for choosing [facility]!!

## 2018-02-19 MED ORDER — RIVAROXABAN 20 MG PO TABS: ORAL_TABLET | ORAL | 1 refills | Status: DC

## 2018-06-08 DIAGNOSIS — I2699 Other pulmonary embolism without acute cor pulmonale: Secondary | ICD-10-CM | POA: Insufficient documentation

## 2018-06-08 DIAGNOSIS — M810 Age-related osteoporosis without current pathological fracture: Secondary | ICD-10-CM | POA: Insufficient documentation

## 2019-02-16 ENCOUNTER — Telehealth: Payer: Self-pay | Admitting: *Deleted

## 2019-02-16 ENCOUNTER — Other Ambulatory Visit: Payer: Self-pay

## 2019-02-16 MED ORDER — RIVAROXABAN 20 MG PO TABS: ORAL_TABLET | ORAL | 2 refills | Status: DC

## 2019-02-16 NOTE — Telephone Encounter (Signed)
Refill sent to pharmacy. Patient also informed about getting an appointment set up for 1 year fu.

## 2019-02-16 NOTE — Telephone Encounter (Signed)
Patient states she is out of refills for her Della Goo would like for [place] to call this in to [facility] in [place].

## 2019-04-22 ENCOUNTER — Encounter: Payer: Self-pay | Admitting: Internal Medicine

## 2019-04-22 ENCOUNTER — Telehealth (INDEPENDENT_AMBULATORY_CARE_PROVIDER_SITE_OTHER): Payer: BC Managed Care – PPO | Admitting: Internal Medicine

## 2019-04-22 DIAGNOSIS — I10 Essential (primary) hypertension: Secondary | ICD-10-CM

## 2019-04-22 DIAGNOSIS — Z86711 Personal history of pulmonary embolism: Secondary | ICD-10-CM | POA: Diagnosis not present

## 2019-04-22 MED ORDER — RIVAROXABAN 10 MG PO TABS: ORAL_TABLET | ORAL | 1 refills | Status: DC

## 2019-04-22 NOTE — Progress Notes (Signed)
Virtual Visit via Telephone Note   This visit type was conducted due to national recommendations for restrictions regarding the COVID-19 Pandemic (e.g. social distancing) in an effort to limit this patient's exposure and mitigate transmission in our community.  Due to her co-morbid illnesses, this patient is at least at moderate risk for complications without adequate follow up.  This format is felt to be most appropriate for this patient at this time.  The patient did not have access to video technology/had technical difficulties with video requiring transitioning to audio format only (telephone).  All issues noted in this document were discussed and addressed.  No physical exam could be performed with this format.  Please refer to the patient's chart for her  consent to telehealth for [facility].   Evaluation Performed:  Telephone follow-up  Date:  04/22/2019   ID:  Ana Baker, DOB 1965/10/14, MRN [id number]  Patient Location:  [address] [place] [id number]  Provider location:   [address], [address] [place], [place] [address]  PCP:  Birdena JubileeZanard, Robyn, MD (Inactive)  Cardiologist:  No primary care provider on file. Electrophysiologist:  None   Chief Complaint:  No complaints  History of Present Illness:    Ana Baker is a 53 y.o. female who presents via audio/video conferencing for a telehealth visit today.  [name] was seen today in telephone follow-up.  Overall she seems to be doing well.  She was seen last year by Joni ReiningKathryn Lawrence, NP who noted she was doing well.  She is remained on Xarelto 20 mg daily now for at least a couple of years.  She has had significant bleeding on warfarin in the past.  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  We have previously discussed the possibility of switching her to lower dose Xarelto.  There was a study in 2017 called [place] choice, which looked at 10 versus 20 mg maintenance doses of Xarelto.  It  showed slightly lower bleeding risk on the 10 mg dose with similar efficacy.  Although the study only went out to a year it suggested that it would be likely alternative to higher dose Xarelto for maintenance therapy.  Because of her younger age and Coumadin risk of bleeding, I feel that this would be a better option.  I discussed this with her including the risks and benefits of going with a lower dose of Xarelto and she seemed agreeable to it.  The patient does not have symptoms concerning for COVID-19 infection (fever, chills, cough, or new SHORTNESS OF BREATH).    Prior CV studies:   The following studies were reviewed today:  Chart reviewed  PMHx:  Past Medical History:  Diagnosis Date  . DVT (deep venous thrombosis) (HCC) 04/2010   and PE, in post-operative setting   . GERD (gastroesophageal reflux disease)   . History of kidney stones   . Hypertension     Past Surgical History:  Procedure Laterality Date  . ABDOMINAL HYSTERECTOMY  2008  . BOWEL RESECTION  03/22/04   sbo  . CHOLECYSTECTOMY OPEN  1990  . GASTRIC BYPASS  07/31/2003   DVT post-op - IV filter  . JOINT REPLACEMENT    . KIDNEY STONE SURGERY  98,00  . KNEE ARTHROSCOPY Left 06,07  . KNEE ARTHROSCOPY Left 12/2009   Dr. Thomasena Edisollins  . MASS EXCISION  03/12/04   right arm  . NM MYOCAR PERF WALL MOTION  04/2010   dipyridamole; normal pattern of  perfusion to all regions; EF 68%; low risk study   . repair internal hernia  09/09/04  . [place]  2009  . TOTAL KNEE ARTHROPLASTY Right 82,98,99,00   DVT & PE post-op 1999  . TRANSTHORACIC ECHOCARDIOGRAM  03/2012   EF=>55%, trace MR & TR, normal RSVP    FAMHx:  Family History  Problem Relation Age of Onset  . Heart failure Father   . Heart disease Father   . Diabetes Mother   . Hypertension Mother   . Heart disease Mother        CHF  . Hypertension Sister   . Colon cancer Maternal Aunt   . Lung cancer Maternal Uncle   . Lung cancer Paternal Uncle   . Lymphoma  Maternal Grandfather   . Heart failure Maternal Grandmother   . Diabetes Paternal Grandfather   . Heart Problems Paternal Grandfather   . Alzheimer's disease Paternal Grandmother     SOCHx:   reports that she has never smoked. She has never used smokeless tobacco. She reports that she does not drink alcohol or use drugs.  ALLERGIES:  Allergies  Allergen Reactions  . Almond Oil     Other reaction(s): Other (See Comments)  . Nsaids Nausea Only  . Penicillins Rash  . Tolmetin Nausea Only  . Vancomycin Rash    Other reaction(s): Other (See Comments)    MEDS:  Current Meds  Medication Sig  . candesartan-hydrochlorothiazide (ATACAND HCT) 16-12.5 MG tablet Take 1 tablet by mouth every morning.  . Cholecalciferol (VITAMIN D-3) 5000 UNITS TABS Take 1 tablet by mouth daily.  . cyclobenzaprine (FLEXERIL) 10 MG tablet Take 10 mg by mouth 3 (three) times daily as needed.    . famotidine (PEPCID) 40 MG tablet Take 1 tablet by mouth at bedtime.  Marland Kitchen FLUoxetine (PROZAC) 40 MG capsule Take 40 mg daily by mouth.  . gabapentin (NEURONTIN) 100 MG capsule Take 100 mg by mouth. Take 1 capsule twice daily and 3 capsules at bedtime.  Marland Kitchen HYDROcodone-acetaminophen (NORCO) 5-325 MG per tablet Take 1 tablet by mouth as needed.   . lubiprostone (AMITIZA) 24 MCG capsule Take 1 capsule (24 mcg total) by mouth 2 (two) times daily with a meal.  . morphine (MS CONTIN) 15 MG 12 hr tablet Take 15 mg by mouth 3 (three) times daily.   . pantoprazole (PROTONIX) 40 MG tablet Take 1 tablet by mouth 2 (two) times daily.  . rivaroxaban (XARELTO) 10 MG TABS tablet Take 1 tablet by mouth daily with supper.  . Semaglutide,0.25 or 0.5MG /DOS, (OZEMPIC, 0.25 OR 0.5 MG/DOSE,) 2 MG/1.5ML SOPN Inject 0.25 mg as directed once a week. For 4 weeks; then will start 0.5 mg one a week.  . SUMAtriptan (IMITREX) 100 MG tablet Take 1 tablet (100 mg total) by mouth every 2 (two) hours as needed for migraine.  . [DISCONTINUED] rivaroxaban  (XARELTO) 20 MG TABS tablet Take 1 tablet by mouth daily with supper. NEED OFFICE VISIT FOR FURTHER REFILLS.     ROS: Pertinent items noted in HPI and remainder of comprehensive ROS otherwise negative.  Labs/Other Tests and Data Reviewed:    Recent Labs: No results found for requested labs within last 8760 hours.   Recent Lipid Panel Lab Results  Component Value Date/Time   CHOL 208 (H) 05/02/2014 08:26 AM   TRIG 107 05/02/2014 08:26 AM   HDL 67 05/02/2014 08:26 AM   CHOLHDL 3.1 05/02/2014 08:26 AM   LDLCALC 120 (H) 05/02/2014 08:26 AM  Wt Readings from Last 3 Encounters:  04/22/19 272 lb (123.4 kg)  02/17/18 279 lb 12.8 oz (126.9 kg)  12/31/16 265 lb 1.6 oz (120.2 kg)     Exam:    Vital Signs:  BP (!) 99/56   Pulse 81   Ht 5\' 8"  (1.727 m)   Wt 272 lb (123.4 kg)   BMI 41.36 kg/m    Exam deferred due to telephone visit  ASSESSMENT & PLAN:    1. History of DVT/PE on lifelong anticoagulation 2. Hypertension  Ms. Minish has a history of DVT PE and is on lifelong anticoagulation.  She is done better on Xarelto without bleeding issues as she had on warfarin.  We have previously discussed the 10 mg maintenance dose of Xarelto based on data from the [place] choice study published in [place] in 2017.  This demonstrated a lower bleeding incidence then the 20 mg dose with similar efficacy.  The study went on for a year so long-term data is not clear however I think this might be a good option for her given her younger age.  I recommended decreasing the dose to 10 mg daily.  COVID-19 Education: The signs and symptoms of COVID-19 were discussed with the patient and how to seek care for testing (follow up with PCP or arrange E-visit).  The importance of social distancing was discussed today.  Patient Risk:   After full review of this patients clinical status, I feel that they are at least moderate risk at this time.  Time:   Today, I have spent 25 minutes with the patient with  telehealth technology discussing DVT/PE, hypertension, bleeding risks.     Medication Adjustments/Labs and Tests Ordered: Current medicines are reviewed at length with the patient today.  Concerns regarding medicines are outlined above.   Tests Ordered: No orders of the defined types were placed in this encounter.   Medication Changes: Meds ordered this encounter  Medications  . rivaroxaban (XARELTO) 10 MG TABS tablet    Sig: Take 1 tablet by mouth daily with supper.    Dispense:  90 tablet    Refill:  1    Disposition:  in 1 year(s)  Chrystie Nose, MD, [facility], FACP  [place]  [facility]  Medical Director of the Advanced Lipid Disorders &  Cardiovascular Risk Reduction Clinic Diplomate of the American Board of Clinical Lipidology Attending Cardiologist  Direct Dial: [phone]  Fax: [phone]  Website:  [email or web address]  Chrystie Nose, MD  04/22/2019 10:00 AM

## 2019-04-22 NOTE — Patient Instructions (Signed)
Medication Instructions:  Decrease Xarelto to 10 mg daily.  If you need a refill on your cardiac medications before your next appointment, please call your pharmacy.    Follow-Up: At [facility], you and your health needs are our priority.  As part of our continuing mission to provide you with exceptional heart care, we have created designated Provider Care Teams.  These Care Teams include your primary Cardiologist (physician) and Advanced Practice Providers (APPs -  Physician Assistants and Nurse Practitioners) who all work together to provide you with the care you need, when you need it. You will need a follow up appointment in 12 months.  Please call our office 2 months in advance to schedule this appointment.  You may see Dr. Debara Pickett or one of the following Advanced Practice Providers on your designated Care Team: Almyra Deforest, [place] . Fabian Sharp, PA-C

## 2019-06-06 DIAGNOSIS — M170 Bilateral primary osteoarthritis of knee: Secondary | ICD-10-CM | POA: Diagnosis not present

## 2019-06-06 DIAGNOSIS — M1712 Unilateral primary osteoarthritis, left knee: Secondary | ICD-10-CM | POA: Diagnosis not present

## 2019-06-30 DIAGNOSIS — E119 Type 2 diabetes mellitus without complications: Secondary | ICD-10-CM | POA: Diagnosis not present

## 2019-07-05 DIAGNOSIS — Z23 Encounter for immunization: Secondary | ICD-10-CM | POA: Diagnosis not present

## 2019-07-05 DIAGNOSIS — K219 Gastro-esophageal reflux disease without esophagitis: Secondary | ICD-10-CM | POA: Diagnosis not present

## 2019-07-05 DIAGNOSIS — F329 Major depressive disorder, single episode, unspecified: Secondary | ICD-10-CM | POA: Diagnosis not present

## 2019-07-05 DIAGNOSIS — K5900 Constipation, unspecified: Secondary | ICD-10-CM | POA: Diagnosis not present

## 2019-07-05 DIAGNOSIS — E119 Type 2 diabetes mellitus without complications: Secondary | ICD-10-CM | POA: Diagnosis not present

## 2019-07-19 DIAGNOSIS — M79672 Pain in left foot: Secondary | ICD-10-CM | POA: Diagnosis not present

## 2019-08-10 DIAGNOSIS — M1712 Unilateral primary osteoarthritis, left knee: Secondary | ICD-10-CM | POA: Diagnosis not present

## 2019-08-15 DIAGNOSIS — Z1239 Encounter for other screening for malignant neoplasm of breast: Secondary | ICD-10-CM | POA: Diagnosis not present

## 2019-08-15 DIAGNOSIS — Z1231 Encounter for screening mammogram for malignant neoplasm of breast: Secondary | ICD-10-CM | POA: Diagnosis not present

## 2019-08-16 DIAGNOSIS — M79672 Pain in left foot: Secondary | ICD-10-CM | POA: Diagnosis not present

## 2019-08-17 DIAGNOSIS — M1712 Unilateral primary osteoarthritis, left knee: Secondary | ICD-10-CM | POA: Diagnosis not present

## 2019-08-24 DIAGNOSIS — M1712 Unilateral primary osteoarthritis, left knee: Secondary | ICD-10-CM | POA: Diagnosis not present

## 2019-09-07 ENCOUNTER — Telehealth: Payer: Self-pay

## 2019-09-07 DIAGNOSIS — K5909 Other constipation: Secondary | ICD-10-CM | POA: Diagnosis not present

## 2019-09-07 DIAGNOSIS — D509 Iron deficiency anemia, unspecified: Secondary | ICD-10-CM | POA: Diagnosis not present

## 2019-09-07 DIAGNOSIS — K219 Gastro-esophageal reflux disease without esophagitis: Secondary | ICD-10-CM | POA: Diagnosis not present

## 2019-09-07 DIAGNOSIS — R1310 Dysphagia, unspecified: Secondary | ICD-10-CM | POA: Diagnosis not present

## 2019-09-07 NOTE — Telephone Encounter (Signed)
   [facility] Pre-operative Risk Assessment    Request for surgical clearance:  1. What type of surgery is being performed? Colonoscopy  2. When is this surgery scheduled? TBD  3. What type of clearance is required (medical clearance vs. Pharmacy clearance to hold med vs. Both)? Both  4. Are there any medications that need to be held prior to surgery and how long? Xarelto held for 2 days prior to surgery  5. Practice name and name of physician performing surgery? [facility]   6. What is your office phone number? [phone]   7.   What is your office fax number? [phone]  8.   Anesthesia type (None, local, MAC, general) ? n/a   Naama Sappington 09/07/2019, 3:50 PM  _________________________________________________________________   (provider comments below)

## 2019-09-08 NOTE — Telephone Encounter (Signed)
Darl Pikes with [facility] called back. I did d/w Darl Pikes as to number of days to hold Xarelto. Darl Pikes states the surgeon is accepting to go with the recommendations from our PharmD. I assured Darl Pikes that I will let our Pre OP Team know of our conversation and we will fax clearance. I thanked Darl Pikes for her help. I will remove from the Pre OP Call Back Pool.

## 2019-09-08 NOTE — Telephone Encounter (Signed)
Pt takes Xarelto 10mg  daily for long term prevention of recurrent VTE. Renal function is normal. Recommend holding Xarelto for 1 day prior to procedure due to hx of recurrent VTE if possible. If performing MD really needs 2 day hold for colonoscopy, would need MD input.

## 2019-09-08 NOTE — Telephone Encounter (Signed)
I left a message for Darl Pikes at [facility] to please return my call. We have a question in regards to Xarelto to be held x 2 days per [facility]. Though our Pharm-D states Xarelto needs to only be held x 1 day. If surgeon is ok with holding x 1 day then ok for clearance, if needs x 2 days to hold xarelto will need MD Dr. Blanchie Dessert input.

## 2019-09-08 NOTE — Telephone Encounter (Signed)
The request form asks to hold Xarelto for 2 days prior to colonoscopy. However, pharmacy recommends only holding Xarelto for 1 day prior to procedure given history of recurrent VTE. Can we ask requesting surgeon's office if this is OK with them? If they still want a 2 day hold, we will need to check with Dr. Rennis Golden.   Thank you!

## 2019-09-08 NOTE — Telephone Encounter (Signed)
   Primary Cardiologist: Chrystie Nose, MD  Chart reviewed as part of pre-operative protocol coverage. Patient was last seen by Dr. Rennis Golden for a virtual visit on 04/22/2019 at which time she was doing well. I called and spoke with patient today. She reports that she is still doing well and denies any cardiac symptoms. Given past medical history and time since last visit, based on ACC/AHA guidelines, SUZZANE QUILTER would be at acceptable risk for the planned procedure without further cardiovascular testing.   Per Pharmacy, "Recommend holding Xarelto for 1 day prior to procedure due to history of recurrent VTE."  I will route this recommendation to the requesting party via Epic fax function and remove from pre-op pool.  Please call with questions.  Corrin Parker, PA-C 09/08/2019, 2:46 PM

## 2019-09-19 DIAGNOSIS — M545 Low back pain: Secondary | ICD-10-CM | POA: Diagnosis not present

## 2019-10-03 DIAGNOSIS — K219 Gastro-esophageal reflux disease without esophagitis: Secondary | ICD-10-CM | POA: Diagnosis not present

## 2019-10-03 DIAGNOSIS — Z9889 Other specified postprocedural states: Secondary | ICD-10-CM | POA: Diagnosis not present

## 2019-10-03 DIAGNOSIS — Z980 Intestinal bypass and anastomosis status: Secondary | ICD-10-CM | POA: Diagnosis not present

## 2019-10-03 DIAGNOSIS — K5730 Diverticulosis of large intestine without perforation or abscess without bleeding: Secondary | ICD-10-CM | POA: Diagnosis not present

## 2019-10-03 DIAGNOSIS — D120 Benign neoplasm of cecum: Secondary | ICD-10-CM | POA: Diagnosis not present

## 2019-10-03 DIAGNOSIS — D126 Benign neoplasm of colon, unspecified: Secondary | ICD-10-CM | POA: Diagnosis not present

## 2019-10-03 DIAGNOSIS — Z1211 Encounter for screening for malignant neoplasm of colon: Secondary | ICD-10-CM | POA: Diagnosis not present

## 2019-10-03 DIAGNOSIS — Z800 Family history of malignant neoplasm of digestive organs: Secondary | ICD-10-CM | POA: Diagnosis not present

## 2019-10-03 DIAGNOSIS — R1310 Dysphagia, unspecified: Secondary | ICD-10-CM | POA: Diagnosis not present

## 2019-10-03 DIAGNOSIS — K317 Polyp of stomach and duodenum: Secondary | ICD-10-CM | POA: Diagnosis not present

## 2019-10-03 DIAGNOSIS — D509 Iron deficiency anemia, unspecified: Secondary | ICD-10-CM | POA: Diagnosis not present

## 2019-10-03 DIAGNOSIS — K3189 Other diseases of stomach and duodenum: Secondary | ICD-10-CM | POA: Diagnosis not present

## 2019-10-05 ENCOUNTER — Other Ambulatory Visit: Payer: Self-pay | Admitting: Internal Medicine

## 2019-10-06 NOTE — Telephone Encounter (Signed)
Rx has been sent to the pharmacy electronically. ° °

## 2019-10-17 DIAGNOSIS — I700 Atherosclerosis of aorta: Secondary | ICD-10-CM | POA: Diagnosis not present

## 2019-10-17 DIAGNOSIS — D509 Iron deficiency anemia, unspecified: Secondary | ICD-10-CM | POA: Diagnosis not present

## 2019-10-18 DIAGNOSIS — I700 Atherosclerosis of aorta: Secondary | ICD-10-CM

## 2019-10-19 DIAGNOSIS — D509 Iron deficiency anemia, unspecified: Secondary | ICD-10-CM | POA: Diagnosis not present

## 2019-10-21 DIAGNOSIS — M1712 Unilateral primary osteoarthritis, left knee: Secondary | ICD-10-CM | POA: Diagnosis not present

## 2019-10-24 ENCOUNTER — Telehealth: Payer: Self-pay

## 2019-10-24 NOTE — Telephone Encounter (Signed)
Dr. Rennis Golden, see below for recommendation by our clinical pharmacist, please review and reply to P CV DIV PREOP

## 2019-10-24 NOTE — Telephone Encounter (Signed)
Patient with diagnosis of recurrent DVT/PE on Xarelto for anticoagulation.    Procedure: LEFT KNEE REPLACEMENT-TKA Date of procedure: TBD  Patient is on Xarelto 10mg  daily for lifelong anticoagulation due to recurrent DVT. Normally would recommend holding anticoagulation for 3 days due to spinal anesthesia. Due to increased risk off anticoagulation, I will defer to Dr. .

## 2019-10-24 NOTE — Telephone Encounter (Signed)
Clinical pharmacist to review Xarelto, h/o recurrent DVT/PE

## 2019-10-24 NOTE — Telephone Encounter (Signed)
   [facility] Pre-operative Risk Assessment    Request for surgical clearance:  1. What type of surgery is being performed? LEFT KNEE REPLACEMENT-TKA   2. When is this surgery scheduled? TBD   3. What type of clearance is required (medical clearance vs. Pharmacy clearance to hold med vs. Both)? BOTH  4. Are there any medications that need to be held prior to surgery and how long?  XARELTO-PROVIDE DIRECTION/BRIDGING??   5. Practice name and name of physician performing surgery?   [facility]  ATTN:[place]  6. What is your office phone number [phone]    7.   What is your office fax number [phone]  8.   Anesthesia type (None, local, MAC, general) ? SPINAL

## 2019-10-24 NOTE — Telephone Encounter (Signed)
There is little data on this, however, I'm more concerned about the spinal anethesia bleeding risk than that of thrombosis - therefore, would recommend holding for 3 days prior to procedure. Could use SCD's for VTE prophylaxis. Resume Xarelto as soon as it is safe after the procedure.  Dr Rennis Golden

## 2019-10-25 NOTE — Telephone Encounter (Signed)
Spoke with the patient, functional ability limited by severe back pain, however no problem doing everyday activity. Denies any recent chest pain or shortness of breath. Ok to proceed with surgery.   Of note, patient says she spoke with her surgeon and she is not comfortable with spinal anesthesia. The surgery has been changed to general anesthesia. Will forward to Preop Pharm pool to decide on the duration of holding for Xarelto again.

## 2019-10-27 DIAGNOSIS — M1712 Unilateral primary osteoarthritis, left knee: Secondary | ICD-10-CM | POA: Diagnosis not present

## 2019-10-27 DIAGNOSIS — F329 Major depressive disorder, single episode, unspecified: Secondary | ICD-10-CM | POA: Diagnosis not present

## 2019-10-27 DIAGNOSIS — R82998 Other abnormal findings in urine: Secondary | ICD-10-CM | POA: Diagnosis not present

## 2019-10-27 DIAGNOSIS — Z79899 Other long term (current) drug therapy: Secondary | ICD-10-CM | POA: Diagnosis not present

## 2019-10-27 DIAGNOSIS — E119 Type 2 diabetes mellitus without complications: Secondary | ICD-10-CM | POA: Diagnosis not present

## 2019-10-27 DIAGNOSIS — Z01818 Encounter for other preprocedural examination: Secondary | ICD-10-CM | POA: Diagnosis not present

## 2019-11-01 ENCOUNTER — Other Ambulatory Visit: Payer: Self-pay | Admitting: Cardiology

## 2019-11-01 MED ORDER — RIVAROXABAN 10 MG PO TABS
10.0000 mg | ORAL_TABLET | Freq: Every day | ORAL | 1 refills | Status: DC
Start: 2019-11-01 — End: 2020-05-11

## 2019-11-01 NOTE — Telephone Encounter (Signed)
Updated fax number per Morrie Sheldon at [facility] is [phone]. I will fax clearance.

## 2019-11-01 NOTE — Telephone Encounter (Signed)
   Primary Cardiologist: Chrystie Nose, MD  Chart reviewed as part of pre-operative protocol coverage. Patient was contacted 11/01/2019 in reference to pre-operative risk assessment for pending surgery as outlined below.  Ana Baker was last seen on 04/22/19 by Dr. Rennis Golden per virtual visit.  Since that day, Ana Baker has done well with no new cardiac symptoms. She has a history of recurrent DVT/PE but no prior known CAD or heart failure.   Therefore, based on ACC/AHA guidelines, the patient would be at acceptable risk for the planned procedure without further cardiovascular testing.   It is OK to hold Xarelto for surgery. Due to her hx of recurrent DVT/PE we recommend holding for no more than 3 days, or 2 days is acceptable if the surgeon agrees. Please resume after surgery as soon as deemed safe by the surgeon.   I will route this recommendation to the requesting party via Epic fax function and remove from pre-op pool.  Please call with questions.  Berton Bon, NP 11/01/2019, 10:21 AM

## 2019-11-01 NOTE — Telephone Encounter (Signed)
Still ok to hold for 3 days prior to TKA. If surgeon is comfortable with 2 day hold, this would be acceptable as well.

## 2019-11-15 DIAGNOSIS — Z79891 Long term (current) use of opiate analgesic: Secondary | ICD-10-CM | POA: Diagnosis not present

## 2019-11-15 DIAGNOSIS — G894 Chronic pain syndrome: Secondary | ICD-10-CM | POA: Diagnosis not present

## 2019-11-15 DIAGNOSIS — D6869 Other thrombophilia: Secondary | ICD-10-CM | POA: Insufficient documentation

## 2019-11-15 DIAGNOSIS — M545 Low back pain: Secondary | ICD-10-CM | POA: Diagnosis not present

## 2019-11-30 ENCOUNTER — Encounter (HOSPITAL_COMMUNITY): Payer: Self-pay

## 2019-11-30 NOTE — Patient Instructions (Addendum)
DUE TO COVID-19 ONLY TWO VISITORS ARE ALLOWED TO COME WITH YOU AND STAY IN THE WAITING ROOM ONLY DURING PRE OP AND PROCEDURE. THE TWO VISITORS MAY VISIT WITH YOU IN YOUR PRIVATE ROOM DURING VISITING HOURS ONLY!!   COVID SWAB TESTING MUST BE COMPLETED ON:  Tuesday, December 06, 2019 at 8:30 AM [address] [place] -[facility] enter pre surgical testing line (Must self quarantine after testing. Follow instructions on handout.)             Your procedure is scheduled on: Friday, December 09, 2019   Report to [facility] Main  Entrance    Report to admitting at 8:00 AM   Call this number if you have problems the morning of surgery [phone]   Do not eat food  :After Midnight.   May have liquids until 7:30 AM day of surgery   CLEAR LIQUID DIET  Foods Allowed                                                                     Foods Excluded  Water, Black Coffee and tea, regular and decaf                             liquids that you cannot  Plain Jell-O in any flavor  (No red)                                           see through such as: Fruit ices (not with fruit pulp)                                     milk, soups, orange juice  Iced Popsicles (No red)                                    All solid food Carbonated beverages, regular and diet                                    Apple juices Sports drinks like Gatorade (No red) Lightly seasoned clear broth or consume(fat free) Sugar, honey syrup  Sample Menu Breakfast                                Lunch                                     Supper Cranberry juice                    Beef broth                            Chicken broth Jell-O  Grape juice                           Apple juice Coffee or tea                        Jell-O                                      Popsicle                                                Coffee or tea                        Coffee or  tea   Complete one G2 drink the morning of surgery at 7:30 AM the day of surgery.   Oral Hygiene is also important to reduce your risk of infection.                                    Remember - BRUSH YOUR TEETH THE MORNING OF SURGERY WITH YOUR REGULAR TOOTHPASTE   Do NOT smoke after Midnight   Take these medicines the morning of surgery with A SIP OF WATER: Fluoxetine, Gabapentin, Linzess, Morphine, Pantoprazole  DO NOT TAKE ANY ORAL DIABETIC MEDICATIONS DAY OF YOUR SURGERY                               You may not have any metal on your body including hair pins, jewelry, and body piercings             Do not wear make-up, lotions, powders, perfumes/cologne, or deodorant             Do not wear nail polish.  Do not shave  48 hours prior to surgery.             Do not bring valuables to the hospital. [facility] IS NOT             RESPONSIBLE   FOR VALUABLES.   Contacts, dentures or bridgework may not be worn into surgery.   Bring small overnight bag day of surgery.    Patients discharged the day of surgery will not be allowed to drive home.   Special Instructions: Bring a copy of your healthcare power of attorney and living will documents         the day of surgery if you haven't scanned them in before.              Please read over the following fact sheets you were given:  How to Manage Your Diabetes Before and After Surgery  Why is it important to control my blood sugar before and after surgery? . Improving blood sugar levels before and after surgery helps healing and can limit problems. . A way of improving blood sugar control is eating a healthy diet by: o  Eating less sugar and carbohydrates o  Increasing activity/exercise o  Talking with your doctor about reaching your blood sugar goals . High blood sugars (greater than  180 mg/dL) can raise your risk of infections and slow your recovery, so you will need to focus on controlling your diabetes during the weeks before  surgery. . Make sure that the doctor who takes care of your diabetes knows about your planned surgery including the date and location.  How do I manage my blood sugar before surgery? . Check your blood sugar at least 4 times a day, starting 2 days before surgery, to make sure that the level is not too high or low. o Check your blood sugar the morning of your surgery when you wake up and every 2 hours until you get to the Short Stay unit. . If your blood sugar is less than 70 mg/dL, you will need to treat for low blood sugar: o Do not take insulin. o Treat a low blood sugar (less than 70 mg/dL) with  cup of clear juice (cranberry or apple), 4 glucose tablets, OR glucose gel. o Recheck blood sugar in 15 minutes after treatment (to make sure it is greater than 70 mg/dL). If your blood sugar is not greater than 70 mg/dL on recheck, call [phone] for further instructions. . Report your blood sugar to the short stay nurse when you get to Short Stay.  . If you are admitted to the hospital after surgery: o Your blood sugar will be checked by the staff and you will probably be given insulin after surgery (instead of oral diabetes medicines) to make sure you have good blood sugar levels. o The goal for blood sugar control after surgery is 80-180 mg/dL.   WHAT DO I DO ABOUT MY DIABETES MEDICATION?  Marland Kitchen Do not take oral diabetes medicines (pills) the morning of surgery.  . The day of surgery, do not take other diabetes injectables, including Byetta (exenatide), Bydureon (exenatide ER), Victoza (liraglutide), or Trulicity (dulaglutide).  Reviewed and Endorsed by [facility] Patient Education Committee, August 2015  [facility] - Preparing for Surgery Before surgery, you can play an important role.  Because skin is not sterile, your skin needs to be as free of germs as possible.  You can reduce the number of germs on your skin by washing with CHG (chlorahexidine gluconate) soap before surgery.  CHG is  an antiseptic cleaner which kills germs and bonds with the skin to continue killing germs even after washing. Please DO NOT use if you have an allergy to CHG or antibacterial soaps.  If your skin becomes reddened/irritated stop using the CHG and inform your nurse when you arrive at Short Stay. Do not shave (including legs and underarms) for at least 48 hours prior to the first CHG shower.  You may shave your face/neck.  Please follow these instructions carefully:  1.  Shower with CHG Soap the night before surgery and the  morning of surgery.  2.  If you choose to wash your hair, wash your hair first as usual with your normal  shampoo.  3.  After you shampoo, rinse your hair and body thoroughly to remove the shampoo.                             4.  Use CHG as you would any other liquid soap.  You can apply chg directly to the skin and wash.  Gently with a scrungie or clean washcloth.  5.  Apply the CHG Soap to your body ONLY FROM THE NECK DOWN.   Do   not use  on face/ open                           Wound or open sores. Avoid contact with eyes, ears mouth and   genitals (private parts).                       Wash face,  Genitals (private parts) with your normal soap.             6.  Wash thoroughly, paying special attention to the area where your    surgery  will be performed.  7.  Thoroughly rinse your body with warm water from the neck down.  8.  DO NOT shower/wash with your normal soap after using and rinsing off the CHG Soap.                9.  Pat yourself dry with a clean towel.            10.  Wear clean pajamas.            11.  Place clean sheets on your bed the night of your first shower and do not  sleep with pets. Day of Surgery : Do not apply any lotions/deodorants the morning of surgery.  Please wear clean clothes to the hospital/surgery center.  FAILURE TO FOLLOW THESE INSTRUCTIONS MAY RESULT IN THE CANCELLATION OF YOUR SURGERY  PATIENT  SIGNATURE_________________________________  NURSE SIGNATURE__________________________________  ________________________________________________________________________   Adam Phenix  An incentive spirometer is a tool that can help keep your lungs clear and active. This tool measures how well you are filling your lungs with each breath. Taking long deep breaths may help reverse or decrease the chance of developing breathing (pulmonary) problems (especially infection) following:  A long period of time when you are unable to move or be active. BEFORE THE PROCEDURE   If the spirometer includes an indicator to show your best effort, your nurse or respiratory therapist will set it to a desired goal.  If possible, sit up straight or lean slightly forward. Try not to slouch.  Hold the incentive spirometer in an upright position. INSTRUCTIONS FOR USE  1. Sit on the edge of your bed if possible, or sit up as far as you can in bed or on a chair. 2. Hold the incentive spirometer in an upright position. 3. Breathe out normally. 4. Place the mouthpiece in your mouth and seal your lips tightly around it. 5. Breathe in slowly and as deeply as possible, raising the piston or the ball toward the top of the column. 6. Hold your breath for 3-5 seconds or for as long as possible. Allow the piston or ball to fall to the bottom of the column. 7. Remove the mouthpiece from your mouth and breathe out normally. 8. Rest for a few seconds and repeat Steps 1 through 7 at least 10 times every 1-2 hours when you are awake. Take your time and take a few normal breaths between deep breaths. 9. The spirometer may include an indicator to show your best effort. Use the indicator as a goal to work toward during each repetition. 10. After each set of 10 deep breaths, practice coughing to be sure your lungs are clear. If you have an incision (the cut made at the time of surgery), support your incision when coughing  by placing a pillow or rolled up towels firmly against it. Once you are able to  get out of bed, walk around indoors and cough well. You may stop using the incentive spirometer when instructed by your caregiver.  RISKS AND COMPLICATIONS  Take your time so you do not get dizzy or light-headed.  If you are in pain, you may need to take or ask for pain medication before doing incentive spirometry. It is harder to take a deep breath if you are having pain. AFTER USE  Rest and breathe slowly and easily.  It can be helpful to keep track of a log of your progress. Your caregiver can provide you with a simple table to help with this. If you are using the spirometer at home, follow these instructions: SEEK MEDICAL CARE IF:   You are having difficultly using the spirometer.  You have trouble using the spirometer as often as instructed.  Your pain medication is not giving enough relief while using the spirometer.  You develop fever of 100.5 F (38.1 C) or higher. SEEK IMMEDIATE MEDICAL CARE IF:   You cough up bloody sputum that had not been present before.  You develop fever of 102 F (38.9 C) or greater.  You develop worsening pain at or near the incision site. MAKE SURE YOU:   Understand these instructions.  Will watch your condition.  Will get help right away if you are not doing well or get worse. Document Released: 12/22/2006 Document Revised: 11/03/2011 Document Reviewed: 02/22/2007 ExitCare Patient Information 2014 [employer], [place].   ________________________________________________________________________  WHAT IS A BLOOD TRANSFUSION? Blood Transfusion Information  A transfusion is the replacement of blood or some of its parts. Blood is made up of multiple cells which provide different functions.  Red blood cells carry oxygen and are used for blood loss replacement.  White blood cells fight against infection.  Platelets control bleeding.  Plasma helps clot  blood.  Other blood products are available for specialized needs, such as hemophilia or other clotting disorders. BEFORE THE TRANSFUSION  Who gives blood for transfusions?   Healthy volunteers who are fully evaluated to make sure their blood is safe. This is blood bank blood. Transfusion therapy is the safest it has ever been in the practice of medicine. Before blood is taken from a donor, a complete history is taken to make sure that person has no history of diseases nor engages in risky social behavior (examples are intravenous drug use or sexual activity with multiple partners). The donor's travel history is screened to minimize risk of transmitting infections, such as malaria. The donated blood is tested for signs of infectious diseases, such as HIV and hepatitis. The blood is then tested to be sure it is compatible with you in order to minimize the chance of a transfusion reaction. If you or a relative donates blood, this is often done in anticipation of surgery and is not appropriate for emergency situations. It takes many days to process the donated blood. RISKS AND COMPLICATIONS Although transfusion therapy is very safe and saves many lives, the main dangers of transfusion include:   Getting an infectious disease.  Developing a transfusion reaction. This is an allergic reaction to something in the blood you were given. Every precaution is taken to prevent this. The decision to have a blood transfusion has been considered carefully by your caregiver before blood is given. Blood is not given unless the benefits outweigh the risks. AFTER THE TRANSFUSION  Right after receiving a blood transfusion, you will usually feel much better and more energetic. This is especially true if your  red blood cells have gotten low (anemic). The transfusion raises the level of the red blood cells which carry oxygen, and this usually causes an energy increase.  The nurse administering the transfusion will monitor  you carefully for complications. HOME CARE INSTRUCTIONS  No special instructions are needed after a transfusion. You may find your energy is better. Speak with your caregiver about any limitations on activity for underlying diseases you may have. SEEK MEDICAL CARE IF:   Your condition is not improving after your transfusion.  You develop redness or irritation at the intravenous (IV) site. SEEK IMMEDIATE MEDICAL CARE IF:  Any of the following symptoms occur over the next 12 hours:  Shaking chills.  You have a temperature by mouth above 102 F (38.9 C), not controlled by medicine.  Chest, back, or muscle pain.  People around you feel you are not acting correctly or are confused.  Shortness of breath or difficulty breathing.  Dizziness and fainting.  You get a rash or develop hives.  You have a decrease in urine output.  Your urine turns a dark color or changes to pink, red, or brown. Any of the following symptoms occur over the next 10 days:  You have a temperature by mouth above 102 F (38.9 C), not controlled by medicine.  Shortness of breath.  Weakness after normal activity.  The white part of the eye turns yellow (jaundice).  You have a decrease in the amount of urine or are urinating less often.  Your urine turns a dark color or changes to pink, red, or brown. Document Released: 08/08/2000 Document Revised: 11/03/2011 Document Reviewed: 03/27/2008 [facility] Patient Information 2014 [place], [place].  _______________________________________________________________________

## 2019-12-01 ENCOUNTER — Encounter (HOSPITAL_COMMUNITY): Payer: Self-pay

## 2019-12-01 ENCOUNTER — Encounter (HOSPITAL_COMMUNITY)
Admission: RE | Admit: 2019-12-01 | Discharge: 2019-12-01 | Disposition: A | Discharge status: Home / Self Care | Payer: BC Managed Care – PPO | Source: Ambulatory Visit | Attending: Specialist | Admitting: Specialist

## 2019-12-01 ENCOUNTER — Other Ambulatory Visit: Payer: Self-pay

## 2019-12-01 DIAGNOSIS — I440 Atrioventricular block, first degree: Secondary | ICD-10-CM | POA: Diagnosis not present

## 2019-12-01 DIAGNOSIS — Z01812 Encounter for preprocedural laboratory examination: Secondary | ICD-10-CM | POA: Insufficient documentation

## 2019-12-01 DIAGNOSIS — E119 Type 2 diabetes mellitus without complications: Secondary | ICD-10-CM | POA: Insufficient documentation

## 2019-12-01 DIAGNOSIS — Z01810 Encounter for preprocedural cardiovascular examination: Secondary | ICD-10-CM | POA: Insufficient documentation

## 2019-12-01 DIAGNOSIS — I10 Essential (primary) hypertension: Secondary | ICD-10-CM | POA: Diagnosis not present

## 2019-12-01 HISTORY — DX: Anemia, unspecified: D64.9

## 2019-12-01 HISTORY — DX: Vitamin D deficiency, unspecified: E55.9

## 2019-12-01 HISTORY — DX: Other specified disorders of bone density and structure, unspecified site: M85.80

## 2019-12-01 HISTORY — DX: Other specified postprocedural states: Z98.890

## 2019-12-01 HISTORY — DX: Venous insufficiency (chronic) (peripheral): I87.2

## 2019-12-01 HISTORY — DX: Unspecified osteoarthritis, unspecified site: M19.90

## 2019-12-01 HISTORY — DX: Personal history of pulmonary embolism: Z86.711

## 2019-12-01 HISTORY — DX: Depression, unspecified: F32.A

## 2019-12-01 HISTORY — DX: Other specified postprocedural states: R11.2

## 2019-12-01 HISTORY — DX: Pneumonia, unspecified organism: J18.9

## 2019-12-01 HISTORY — DX: Endometriosis, unspecified: N80.9

## 2019-12-01 HISTORY — DX: Anxiety disorder, unspecified: F41.9

## 2019-12-01 HISTORY — DX: Hypoparathyroidism, unspecified: E20.9

## 2019-12-01 HISTORY — DX: Other intervertebral disc degeneration, lumbar region without mention of lumbar back pain or lower extremity pain: M51.369

## 2019-12-01 HISTORY — DX: Other intervertebral disc degeneration, lumbar region: M51.36

## 2019-12-01 HISTORY — DX: Spinal stenosis, site unspecified: M48.00

## 2019-12-01 HISTORY — DX: Type 2 diabetes mellitus without complications: E11.9

## 2019-12-01 HISTORY — DX: Migraine, unspecified, not intractable, without status migrainosus: G43.909

## 2019-12-01 HISTORY — DX: Obesity, unspecified: E66.9

## 2019-12-01 LAB — COMPREHENSIVE METABOLIC PANEL
ALT: 18 U/L (ref 0–44)
AST: 21 U/L (ref 15–41)
Albumin: 3.8 g/dL (ref 3.5–5.0)
Alkaline Phosphatase: 34 U/L — ABNORMAL LOW (ref 38–126)
Anion gap: 8 (ref 5–15)
BUN: 17 mg/dL (ref 6–20)
CO2: 21 mmol/L — ABNORMAL LOW (ref 22–32)
Calcium: 8.4 mg/dL — ABNORMAL LOW (ref 8.9–10.3)
Chloride: 107 mmol/L (ref 98–111)
Creatinine, Ser: 0.83 mg/dL (ref 0.44–1.00)
GFR calc Af Amer: >60 mL/min (ref >60)
GFR calc non Af Amer: >60 mL/min (ref >60)
Glucose, Bld: 92 mg/dL (ref 70–99)
Potassium: 3.8 mmol/L (ref 3.5–5.1)
Sodium: 136 mmol/L (ref 135–145)
Total Bilirubin: 0.6 mg/dL (ref 0.3–1.2)
Total Protein: 6.9 g/dL (ref 6.5–8.1)

## 2019-12-01 LAB — CBC
HCT: 36.7 % (ref 36.0–46.0)
Hemoglobin: 11 g/dL — ABNORMAL LOW (ref 12.0–15.0)
MCH: 26.2 pg (ref 26.0–34.0)
MCHC: 30 g/dL (ref 30.0–36.0)
MCV: 87.4 fL (ref 80.0–100.0)
Platelets: 312 10*3/uL (ref 150–400)
RBC: 4.2 MIL/uL (ref 3.87–5.11)
RDW: 17.9 % — ABNORMAL HIGH (ref 11.5–15.5)
WBC: 5.4 10*3/uL (ref 4.0–10.5)
nRBC: 0 % (ref 0.0–0.2)

## 2019-12-01 LAB — GLUCOSE, CAPILLARY: Glucose-Capillary: 74 mg/dL (ref 70–99)

## 2019-12-01 LAB — PROTIME-INR
INR: 1.1 (ref 0.8–1.2)
Prothrombin Time: 13.8 seconds (ref 11.4–15.2)

## 2019-12-01 LAB — APTT: aPTT: 33 seconds (ref 24–36)

## 2019-12-01 NOTE — H&P (View-Only) (Signed)
PCP - Dr. R. Zanard pre op exam visit 10/27/19 in care everywhere Cardiologist - Dr. K. Hilty last office visit 04/22/19 in epic, clearance note J. Hammond NP telephone encounter 10/24/19 in epic  Chest x-ray - greater than 1 year EKG - 12/01/19 in epic Stress Test - greater than 2 years ECHO - greater than 2 years Cardiac Cath - N/A  Sleep Study - N/A CPAP - N/A  Fasting Blood Sugar - does not check daily Checks Blood Sugar _as needed only____   Blood Thinner Instructions: Xarelto will stop 2-3 days prior to surgery Aspirin Instructions: N/A Last Dose:N/A  Anesthesia review: New 1st AV block on EKG 12/01/19, History of DVT and PE after surgery, DM, HTN  Patient denies shortness of breath, fever, cough and chest pain at PAT appointment   Patient verbalized understanding of instructions that were given to them at the PAT appointment. Patient was also instructed that they will need to review over the PAT instructions again at home before surgery. 

## 2019-12-01 NOTE — Progress Notes (Signed)
PCP - Dr. Arneta Cliche pre op exam visit 10/27/19 in care everywhere Cardiologist - Dr. Ellene Route last office visit 04/22/19 in epic, clearance note J. 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 Patient was also instructed that they will need to review over the PAT instructions again at home before surgery.

## 2019-12-01 NOTE — Progress Notes (Signed)
PCP - Dr. Arneta Cliche pre op exam visit 10/27/19 in care everywhere Cardiologist - Dr. Ellene Route last office visit 04/22/19 in epic, clearance note J. 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 Patient was also instructed that they will need to review over the PAT instructions again at home before surgery.

## 2019-12-02 LAB — SURGICAL PCR SCREEN
MRSA, PCR: POSITIVE — AB
Staphylococcus aureus: POSITIVE — AB

## 2019-12-02 NOTE — Progress Notes (Signed)
Anesthesia Chart Review   Case: [id number] Date/Time: 12/09/19 0715   Procedure: TOTAL KNEE ARTHROPLASTY (Left Knee) - adductor canal   Anesthesia type: General   Pre-op diagnosis: Left knee osteoarthritis   Location: [facility] ROOM 07 / [facility]   Surgeons: Eugenia Mcalpine, MD      DISCUSSION:54 y.o. never smoker with h/o PONV, HTN, GERD, DM II, PE, DVT 2011, left knee OA scheduled for above procedure 12/09/2019 with Dr. Eugenia Mcalpine.   Pt cleared by cardiology 11/01/2019.  Per Berton Bon, NP, "Ana Baker was last seen on 04/22/19 by Dr. Rennis Golden per virtual visit.  Since that day, Ana Baker has done well with no new cardiac symptoms. She has a history of recurrent DVT/PE but no prior known CAD or heart failure.  Therefore, based on ACC/AHA guidelines, the patient would be at acceptable risk for the planned procedure without further cardiovascular testing.  It is OK to hold Xarelto for surgery. Due to her hx of recurrent DVT/PE we recommend holding for no more than 3 days, or 2 days is acceptable if the surgeon agrees. Please resume after surgery as soon as deemed safe by the surgeon."  Pt seen by PCP 10/27/2019 for preoperative evaluation.  Per OV note pt is cleared for surgery.   Anticipate pt can proceed with planned procedure barring acute status change.   VS: BP 106/75   Pulse 61   Temp 37 C (Oral)   Resp 16   Ht 5\' 8"  (1.727 m)   Wt 113.9 kg   SpO2 100%   BMI 38.16 kg/m   PROVIDERS: , MD is PCP   Birdena Jubilee, MD is Cardiologist  LABS: Labs reviewed: Acceptable for surgery. (all labs ordered are listed, but only abnormal results are displayed)  Labs Reviewed  SURGICAL PCR SCREEN - Abnormal; Notable for the following components:      Result Value   MRSA, PCR POSITIVE (*)    Staphylococcus aureus POSITIVE (*)    All other components within normal limits  CBC - Abnormal; Notable for the following components:   Hemoglobin 11.0 (*)    RDW 17.9 (*)    All  other components within normal limits  COMPREHENSIVE METABOLIC PANEL - Abnormal; Notable for the following components:   CO2 21 (*)    Calcium 8.4 (*)    Alkaline Phosphatase 34 (*)    All other components within normal limits  APTT  PROTIME-INR  GLUCOSE, CAPILLARY  TYPE AND SCREEN  ABO/RH     IMAGES:   EKG: 12/01/2019 Rate 61 bpm  Sinus rhythm with 1st degree A-V block Otherwise normal ECG First degree A-V block New since previous tracing  CV: Echo 03/26/2012 Interpretation Summary A complete two-dimensional transthoracic echocardiogram was performed (2D, M-mode, Doppler and color flow Doppler.)  Mild proximal septal thickening is noted.  Left ventricular systolic function is normal.  Ejection Fraction =>55% The transmitral spectral Doppler flow pattern is normal for age.  The left atrial size is normal  Right ventricular systolic pressure is normal No significant valvular disease.  Past Medical History:  Diagnosis Date  . Anemia   . Anxiety   . DDD (degenerative disc disease), lumbar   . DDD (degenerative disc disease), lumbar   . Depression   . Diabetes mellitus without complication (HCC)   . DVT (deep venous thrombosis) (HCC) 04/2010   and PE, in post-operative setting   . Endometriosis   . GERD (gastroesophageal reflux disease)   . History  of kidney stones   . History of pulmonary embolism   . Hypertension   . Idiopathic parathyroidism ([place])   . Migraines   . OA (osteoarthritis)   . Obesity   . Osteopenia   . Pneumonia    x2  . PONV (postoperative nausea and vomiting)   . Spinal stenosis   . Venous reflux    left leg  . Vitamin D deficiency     Past Surgical History:  Procedure Laterality Date  . ABDOMINAL HYSTERECTOMY  2008  . BOWEL RESECTION  03/22/04   sbo  . CHOLECYSTECTOMY OPEN  1990  . COLONOSCOPY W/ POLYPECTOMY    . GASTRIC BYPASS  07/31/2003   DVT post-op - IV filter  . HERNIA REPAIR  08/2004   Internal hernia repair  . JOINT  REPLACEMENT    . KIDNEY STONE SURGERY  98,00  . KNEE ARTHROSCOPY Left 06,07  . KNEE ARTHROSCOPY Left 12/2009   Dr. Theda Sers  . LIPOMA EXCISION  03/12/04   right arm  . NM MYOCAR PERF WALL MOTION  04/2010   dipyridamole; normal pattern of perfusion to all regions; EF 68%; low risk study   . SALPINGOOPHORECTOMY Left   . [place]  2009  . TOTAL KNEE ARTHROPLASTY Right 82,98,99,00   DVT & PE post-op 1999  . TRANSTHORACIC ECHOCARDIOGRAM  03/2012   EF=>55%, trace MR & TR, normal RSVP    MEDICATIONS: . candesartan-hydrochlorothiazide (ATACAND HCT) 16-12.5 MG tablet  . famotidine (PEPCID) 40 MG tablet  . FLUoxetine (PROZAC) 40 MG capsule  . gabapentin (NEURONTIN) 100 MG capsule  . HYDROcodone-acetaminophen (NORCO) 5-325 MG per tablet  . linaclotide (LINZESS) 145 MCG CAPS capsule  . Liniments (DEEP BLUE RELIEF EX)  . lubiprostone (AMITIZA) 24 MCG capsule  . morphine (MS CONTIN) 15 MG 12 hr tablet  . pantoprazole (PROTONIX) 40 MG tablet  . Pediatric Multivitamins-Iron (CHILDRENS MULTIVITAMIN/IRON PO)  . rivaroxaban (XARELTO) 10 MG TABS tablet  . Semaglutide,0.25 or 0.5MG /DOS, (OZEMPIC, 0.25 OR 0.5 MG/DOSE,) 2 MG/1.5ML SOPN  . SUMAtriptan (IMITREX) 100 MG tablet  . topiramate (TOPAMAX) 100 MG tablet   No current facility-administered medications for this encounter.     [facility] Pre-Surgical Testing [phone] 12/02/19  11:59 AM

## 2019-12-03 LAB — ABO/RH: ABO/RH(D): A POS

## 2019-12-06 ENCOUNTER — Other Ambulatory Visit (HOSPITAL_COMMUNITY)
Admission: RE | Admit: 2019-12-06 | Discharge: 2019-12-06 | Disposition: A | Discharge status: Home / Self Care | Payer: BC Managed Care – PPO | Source: Ambulatory Visit | Attending: Specialist | Admitting: Specialist

## 2019-12-06 DIAGNOSIS — Z20822 Contact with and (suspected) exposure to covid-19: Secondary | ICD-10-CM | POA: Diagnosis not present

## 2019-12-06 DIAGNOSIS — Z01812 Encounter for preprocedural laboratory examination: Secondary | ICD-10-CM | POA: Diagnosis not present

## 2019-12-06 LAB — SARS CORONAVIRUS 2 (TAT 6-24 HRS): SARS Coronavirus 2: NEGATIVE

## 2019-12-07 NOTE — H&P (Signed)
TOTAL KNEE ADMISSION H&P  Patient is being admitted for left total knee arthroplasty.  Subjective:  Chief Complaint:left knee pain.  HPI: Ana Baker, 54 y.o. female, has a history of pain and functional disability in the left knee due to arthritis and has failed non-surgical conservative treatments for greater than 12 weeks to includeNSAID's and/or analgesics, corticosteriod injections, viscosupplementation injections, flexibility and strengthening excercises, supervised PT with diminished ADL's post treatment and activity modification.  Onset of symptoms was gradual, starting >10 years ago with gradually worsening course since that time. The patient noted prior procedures on the knee to include  arthroscopy, menisectomy and MPFL on the left knee(s).  Patient currently rates pain in the left knee(s) at 5 out of 10 with activity. Patient has night pain, worsening of pain with activity and weight bearing, pain that interferes with activities of daily living and crepitus.  Patient has evidence of periarticular osteophytes and joint space narrowing by imaging studies. This patient has had Previous MPFL rupture with reconstruction. There is no active infection.  Patient Active Problem List   Diagnosis Date Noted  . DVT, lower extremity (HCC) 10/07/2013  . Hx pulmonary embolism 10/07/2013  . Spinal stenosis at L4-L5 level 10/07/2013  . Obesity (BMI 35.0-39.9 without comorbidity) 10/07/2013  . Chronic anticoagulation 10/07/2013  . Abdominal pain, acute 03/21/2011   Past Medical History:  Diagnosis Date  . Anemia   . Anxiety   . DDD (degenerative disc disease), lumbar   . DDD (degenerative disc disease), lumbar   . Depression   . Diabetes mellitus without complication (HCC)   . DVT (deep venous thrombosis) (HCC) 04/2010   and PE, in post-operative setting   . Endometriosis   . GERD (gastroesophageal reflux disease)   . History of kidney stones   . History of pulmonary embolism   .  Hypertension   . Idiopathic parathyroidism (HCC)   . Migraines   . OA (osteoarthritis)   . Obesity   . Osteopenia   . Pneumonia    x2  . PONV (postoperative nausea and vomiting)   . Spinal stenosis   . Venous reflux    left leg  . Vitamin D deficiency     Past Surgical History:  Procedure Laterality Date  . ABDOMINAL HYSTERECTOMY  2008  . BOWEL RESECTION  03/22/04   sbo  . CHOLECYSTECTOMY OPEN  1990  . COLONOSCOPY W/ POLYPECTOMY    . GASTRIC BYPASS  07/31/2003   DVT post-op - IV filter  . HERNIA REPAIR  08/2004   Internal hernia repair  . JOINT REPLACEMENT    . KIDNEY STONE SURGERY  98,00  . KNEE ARTHROSCOPY Left 06,07  . KNEE ARTHROSCOPY Left 12/2009   Dr. Thomasena Edis  . LIPOMA EXCISION  03/12/04   right arm  . NM MYOCAR PERF WALL MOTION  04/2010   dipyridamole; normal pattern of perfusion to all regions; EF 68%; low risk study   . SALPINGOOPHORECTOMY Left   . SPINE SURGERY  2009  . TOTAL KNEE ARTHROPLASTY Right 82,98,99,00   DVT & PE post-op 1999  . TRANSTHORACIC ECHOCARDIOGRAM  03/2012   EF=>55%, trace MR & TR, normal RSVP    No current facility-administered medications for this encounter.   Current Outpatient Medications  Medication Sig Dispense Refill Last Dose  . candesartan-hydrochlorothiazide (ATACAND HCT) 16-12.5 MG tablet Take 1 tablet by mouth every morning.     . famotidine (PEPCID) 40 MG tablet Take 40 mg by mouth at bedtime.   3   .  FLUoxetine (PROZAC) 40 MG capsule Take 40 mg daily by mouth.     . gabapentin (NEURONTIN) 100 MG capsule Take 100-300 mg by mouth See admin instructions. Take 100 mg in the morning, 100 mg in the evening, and 300 mg at bedtime     . HYDROcodone-acetaminophen (NORCO) 5-325 MG per tablet Take 1 tablet by mouth every 8 (eight) hours as needed for moderate pain.      Marland Kitchen linaclotide (LINZESS) 145 MCG CAPS capsule Take 145 mcg by mouth daily.     . Liniments (DEEP BLUE RELIEF EX) Apply 1 application topically daily as needed (pain).     Marland Kitchen  morphine (MS CONTIN) 15 MG 12 hr tablet Take 15 mg by mouth 3 (three) times daily.      . pantoprazole (PROTONIX) 40 MG tablet Take 40 mg by mouth 2 (two) times daily.      . Pediatric Multivitamins-Iron (CHILDRENS MULTIVITAMIN/IRON PO) Take 1 tablet by mouth daily. Flinstones     . rivaroxaban (XARELTO) 10 MG TABS tablet Take 1 tablet (10 mg total) by mouth daily. Need appointment 90 tablet 1   . Semaglutide,0.25 or 0.5MG /DOS, (OZEMPIC, 0.25 OR 0.5 MG/DOSE,) 2 MG/1.5ML SOPN Inject 0.5 mg as directed every Monday.      . SUMAtriptan (IMITREX) 100 MG tablet Take 1 tablet (100 mg total) by mouth every 2 (two) hours as needed for migraine. 27 tablet 3   . topiramate (TOPAMAX) 100 MG tablet Take 100 mg by mouth at bedtime.     Marland Kitchen lubiprostone (AMITIZA) 24 MCG capsule Take 1 capsule (24 mcg total) by mouth 2 (two) times daily with a meal. (Patient not taking: Reported on 11/23/2019) 60 capsule 11 Not Taking at Unknown time   Allergies  Allergen Reactions  . Almond Oil Anaphylaxis and Rash    ALL ALMONDS!  . Nsaids Nausea Only  . Adhesive [Tape] Hives and Rash    Burns skin  . Penicillins Rash    Did it involve swelling of the face/tongue/throat, SOB, or low BP? No Did it involve sudden or severe rash/hives, skin peeling, or any reaction on the inside of your mouth or nose? Yes Did you need to seek medical attention at a hospital or doctor's office? Was in the hospital when it happend When did it last happen?21 years ago If all above answers are "NO", may proceed with cephalosporin use.   . Tolmetin Nausea Only  . Vancomycin Rash    Social History   Tobacco Use  . Smoking status: Never Smoker  . Smokeless tobacco: Never Used  Substance Use Topics  . Alcohol use: Not Currently    Family History  Problem Relation Age of Onset  . Heart failure Father   . Heart disease Father   . Diabetes Mother   . Hypertension Mother   . Heart disease Mother        CHF  . Hypertension Sister    . Colon cancer Maternal Aunt   . Lung cancer Maternal Uncle   . Lung cancer Paternal Uncle   . Lymphoma Maternal Grandfather   . Heart failure Maternal Grandmother   . Diabetes Paternal Grandfather   . Heart Problems Paternal Grandfather   . Alzheimer's disease Paternal Grandmother      Review of Systems  Constitutional: Negative.   HENT: Negative.   Eyes: Negative.   Respiratory: Negative.   Cardiovascular: Negative.   Gastrointestinal: Negative.   Endocrine: Negative.   Genitourinary: Negative.   Musculoskeletal: Positive for  arthralgias and back pain.  Skin: Positive for rash.  Allergic/Immunologic: Negative.   Neurological: Negative.   Hematological: Negative.   Psychiatric/Behavioral: Negative.     Objective:  Physical Exam  Constitutional: She is oriented to person, place, and time. She appears well-developed and well-nourished.  HENT:  Head: Normocephalic and atraumatic.  Eyes: EOM are normal.  Neck: No JVD present. No thyromegaly present.  Cardiovascular: Normal rate, regular rhythm, normal heart sounds and intact distal pulses. Exam reveals no gallop and no friction rub.  No murmur heard. Respiratory: Effort normal and breath sounds normal. No stridor. No respiratory distress. She has no wheezes. She has no rales. She exhibits no tenderness.  GI: Soft. There is no abdominal tenderness.  Musculoskeletal:     Cervical back: Normal range of motion and neck supple.     Comments: Tenderness with palpation over medial and lateral joint line ROM 0-100 5/5 strength with resisted ROM NVI in left lower extremity  Neurological: She is alert and oriented to person, place, and time.  Skin: Skin is warm and dry. No rash noted. No erythema. No pallor.  Psychiatric: She has a normal mood and affect. Her behavior is normal. Judgment and thought content normal.    Vital signs in last 24 hours:    Labs:   Estimated body mass index is 38.16 kg/m as calculated from the  following:   Height as of 12/01/19: 5\' 8"  (1.727 m).   Weight as of 12/01/19: 113.9 kg.   Imaging Review Plain radiographs demonstrate severe degenerative joint disease of the left knee(s). The overall alignment ismild varus. The bone quality appears to be good for age and reported activity level.      Assessment/Plan:  End stage arthritis, left knee   The patient history, physical examination, clinical judgment of the provider and imaging studies are consistent with end stage degenerative joint disease of the left knee(s) and total knee arthroplasty is deemed medically necessary. The treatment options including medical management, injection therapy arthroscopy and arthroplasty were discussed at length. The risks and benefits of total knee arthroplasty were presented and reviewed. The risks due to aseptic loosening, infection, stiffness, patella tracking problems, thromboembolic complications and other imponderables were discussed. The patient acknowledged the explanation, agreed to proceed with the plan and consent was signed. Patient is being admitted for inpatient treatment for surgery, pain control, PT, OT, prophylactic antibiotics, VTE prophylaxis, progressive ambulation and ADL's and discharge planning. The patient is planning to be discharged home but doing outpatient physical therapy     Patient's anticipated LOS is less than 2 midnights, meeting these requirements: - Younger than 15 - Lives within 1 hour of care - Has a competent adult at home to recover with post-op recover - NO history of  - Chronic pain requiring opiods  - Diabetes  - Coronary Artery Disease  - Heart failure  - Heart attack  - Stroke  - DVT/VTE  - Cardiac arrhythmia  - Respiratory Failure/COPD  - Renal failure  - Anemia  - Advanced Liver disease

## 2019-12-08 MED ORDER — BUPIVACAINE LIPOSOME 1.3 % IJ SUSP
20.0000 mL | INTRAMUSCULAR | Status: AC
  Filled 2019-12-08: qty 20

## 2019-12-09 ENCOUNTER — Encounter (HOSPITAL_COMMUNITY): Payer: Self-pay | Admitting: Specialist

## 2019-12-09 ENCOUNTER — Observation Stay (HOSPITAL_COMMUNITY)
Admission: AD | Admit: 2019-12-09 | Discharge: 2019-12-11 | Disposition: A | Discharge status: Home / Self Care | Payer: BC Managed Care – PPO | Attending: Specialist | Admitting: Specialist

## 2019-12-09 ENCOUNTER — Other Ambulatory Visit: Payer: Self-pay

## 2019-12-09 ENCOUNTER — Ambulatory Visit (HOSPITAL_COMMUNITY): Payer: BC Managed Care – PPO | Admitting: Anesthesiology

## 2019-12-09 ENCOUNTER — Encounter (HOSPITAL_COMMUNITY)
Admission: AD | Disposition: A | Discharge status: Home / Self Care | Payer: Self-pay | Source: Home / Self Care | Attending: Specialist

## 2019-12-09 ENCOUNTER — Ambulatory Visit (HOSPITAL_COMMUNITY): Payer: BC Managed Care – PPO | Admitting: Physician Assistant

## 2019-12-09 DIAGNOSIS — M1712 Unilateral primary osteoarthritis, left knee: Secondary | ICD-10-CM | POA: Diagnosis not present

## 2019-12-09 DIAGNOSIS — Z96651 Presence of right artificial knee joint: Secondary | ICD-10-CM | POA: Diagnosis not present

## 2019-12-09 DIAGNOSIS — M79662 Pain in left lower leg: Secondary | ICD-10-CM

## 2019-12-09 DIAGNOSIS — Z86711 Personal history of pulmonary embolism: Secondary | ICD-10-CM | POA: Insufficient documentation

## 2019-12-09 DIAGNOSIS — I10 Essential (primary) hypertension: Secondary | ICD-10-CM | POA: Diagnosis not present

## 2019-12-09 DIAGNOSIS — E119 Type 2 diabetes mellitus without complications: Secondary | ICD-10-CM | POA: Diagnosis not present

## 2019-12-09 DIAGNOSIS — Z79899 Other long term (current) drug therapy: Secondary | ICD-10-CM | POA: Diagnosis not present

## 2019-12-09 DIAGNOSIS — Z86718 Personal history of other venous thrombosis and embolism: Secondary | ICD-10-CM | POA: Diagnosis not present

## 2019-12-09 DIAGNOSIS — E559 Vitamin D deficiency, unspecified: Secondary | ICD-10-CM | POA: Diagnosis not present

## 2019-12-09 DIAGNOSIS — Z7901 Long term (current) use of anticoagulants: Secondary | ICD-10-CM | POA: Diagnosis not present

## 2019-12-09 HISTORY — PX: TOTAL KNEE ARTHROPLASTY: SHX125

## 2019-12-09 LAB — TYPE AND SCREEN
ABO/RH(D): A POS
Antibody Screen: NEGATIVE

## 2019-12-09 LAB — GLUCOSE, CAPILLARY
Glucose-Capillary: 88 mg/dL (ref 70–99)
Glucose-Capillary: 103 mg/dL — ABNORMAL HIGH (ref 70–99)

## 2019-12-09 LAB — HEMOGLOBIN A1C
Hgb A1c MFr Bld: 5.2 % (ref 4.8–5.6)
Mean Plasma Glucose: 102.54 mg/dL

## 2019-12-09 SURGERY — ARTHROPLASTY, KNEE, TOTAL
Anesthesia: Spinal | Site: Knee | Laterality: Left

## 2019-12-09 MED ORDER — SODIUM CHLORIDE (PF) 0.9 % IJ SOLN
INTRAMUSCULAR | Status: AC
  Filled 2019-12-09: qty 50

## 2019-12-09 MED ORDER — HYDROMORPHONE HCL 1 MG/ML IJ SOLN
INTRAMUSCULAR | Status: AC
  Filled 2019-12-09: qty 1

## 2019-12-09 MED ORDER — HYDROMORPHONE HCL 1 MG/ML IJ SOLN
0.5000 mg | INTRAMUSCULAR | Status: AC | PRN
  Administered 2019-12-09 (×2): 0.5 mg via INTRAVENOUS

## 2019-12-09 MED ORDER — ACETAMINOPHEN 500 MG PO TABS
500.0000 mg | ORAL_TABLET | Freq: Four times a day (QID) | ORAL | Status: AC
  Administered 2019-12-10: 500 mg via ORAL
  Filled 2019-12-09 (×2): qty 1

## 2019-12-09 MED ORDER — SODIUM CHLORIDE 0.9 % IV SOLN: INTRAVENOUS | Status: DC

## 2019-12-09 MED ORDER — ONDANSETRON HCL 4 MG/2ML IJ SOLN: 4.0000 mg | Freq: Once | INTRAMUSCULAR | Status: DC | PRN

## 2019-12-09 MED ORDER — POVIDONE-IODINE 10 % EX SWAB
2.0000 "application " | Freq: Once | CUTANEOUS | Status: AC
  Administered 2019-12-09: 2 via TOPICAL

## 2019-12-09 MED ORDER — ROCURONIUM BROMIDE 10 MG/ML (PF) SYRINGE
PREFILLED_SYRINGE | INTRAVENOUS | Status: AC
  Filled 2019-12-09: qty 10

## 2019-12-09 MED ORDER — ACETAMINOPHEN 325 MG PO TABS: 325.0000 mg | ORAL_TABLET | Freq: Four times a day (QID) | ORAL | Status: DC | PRN

## 2019-12-09 MED ORDER — ROCURONIUM BROMIDE 10 MG/ML (PF) SYRINGE
PREFILLED_SYRINGE | INTRAVENOUS | Status: DC | PRN
  Administered 2019-12-09: 100 mg via INTRAVENOUS
  Administered 2019-12-09: 20 mg via INTRAVENOUS

## 2019-12-09 MED ORDER — LACTATED RINGERS IV SOLN: INTRAVENOUS | Status: DC

## 2019-12-09 MED ORDER — FAMOTIDINE 20 MG PO TABS
40.0000 mg | ORAL_TABLET | Freq: Every day | ORAL | Status: DC
  Administered 2019-12-09 – 2019-12-10 (×2): 40 mg via ORAL
  Filled 2019-12-09 (×2): qty 2

## 2019-12-09 MED ORDER — FENTANYL CITRATE (PF) 100 MCG/2ML IJ SOLN
INTRAMUSCULAR | Status: DC | PRN
  Administered 2019-12-09: 25 ug via INTRAVENOUS
  Administered 2019-12-09 (×2): 50 ug via INTRAVENOUS
  Administered 2019-12-09: 25 ug via INTRAVENOUS
  Administered 2019-12-09: 100 ug via INTRAVENOUS
  Administered 2019-12-09 (×2): 50 ug via INTRAVENOUS
  Administered 2019-12-09: 100 ug via INTRAVENOUS
  Administered 2019-12-09: 50 ug via INTRAVENOUS

## 2019-12-09 MED ORDER — METHOCARBAMOL 500 MG PO TABS: 500.0000 mg | ORAL_TABLET | Freq: Four times a day (QID) | ORAL | 0 refills | Status: DC

## 2019-12-09 MED ORDER — FENTANYL CITRATE (PF) 100 MCG/2ML IJ SOLN
INTRAMUSCULAR | Status: AC
  Filled 2019-12-09: qty 2

## 2019-12-09 MED ORDER — SODIUM CHLORIDE 0.9 % IR SOLN
Status: DC | PRN
  Administered 2019-12-09: 1000 mL

## 2019-12-09 MED ORDER — BISACODYL 5 MG PO TBEC: 5.0000 mg | DELAYED_RELEASE_TABLET | Freq: Every day | ORAL | Status: DC | PRN

## 2019-12-09 MED ORDER — GABAPENTIN 100 MG PO CAPS
100.0000 mg | ORAL_CAPSULE | Freq: Two times a day (BID) | ORAL | Status: DC
  Administered 2019-12-09 – 2019-12-11 (×4): 100 mg via ORAL
  Filled 2019-12-09 (×4): qty 1

## 2019-12-09 MED ORDER — METHOCARBAMOL 500 MG IVPB - SIMPLE MED
INTRAVENOUS | Status: AC
  Filled 2019-12-09: qty 50

## 2019-12-09 MED ORDER — STERILE WATER FOR IRRIGATION IR SOLN
Status: DC | PRN
  Administered 2019-12-09: 2000 mL

## 2019-12-09 MED ORDER — SUMATRIPTAN SUCCINATE 50 MG PO TABS
100.0000 mg | ORAL_TABLET | ORAL | Status: DC | PRN
  Filled 2019-12-09: qty 2

## 2019-12-09 MED ORDER — 0.9 % SODIUM CHLORIDE (POUR BTL) OPTIME
TOPICAL | Status: DC | PRN
  Administered 2019-12-09: 09:00:00 1000 mL

## 2019-12-09 MED ORDER — MEPERIDINE HCL 50 MG/ML IJ SOLN: 6.2500 mg | INTRAMUSCULAR | Status: DC | PRN

## 2019-12-09 MED ORDER — HYDROCODONE-ACETAMINOPHEN 7.5-325 MG PO TABS
1.0000 | ORAL_TABLET | ORAL | Status: DC | PRN
  Administered 2019-12-10 (×2): 1 via ORAL
  Administered 2019-12-10 – 2019-12-11 (×4): 2 via ORAL
  Filled 2019-12-09 (×4): qty 2
  Filled 2019-12-09 (×2): qty 1

## 2019-12-09 MED ORDER — MORPHINE SULFATE ER 15 MG PO TBCR
15.0000 mg | EXTENDED_RELEASE_TABLET | Freq: Three times a day (TID) | ORAL | Status: DC
  Administered 2019-12-09 – 2019-12-11 (×6): 15 mg via ORAL
  Filled 2019-12-09 (×6): qty 1

## 2019-12-09 MED ORDER — HYDROMORPHONE HCL 1 MG/ML IJ SOLN
INTRAMUSCULAR | Status: AC
  Filled 2019-12-09: qty 2

## 2019-12-09 MED ORDER — MIDAZOLAM HCL 2 MG/2ML IJ SOLN
INTRAMUSCULAR | Status: DC | PRN
  Administered 2019-12-09: 2 mg via INTRAVENOUS

## 2019-12-09 MED ORDER — ONDANSETRON HCL 4 MG/2ML IJ SOLN
INTRAMUSCULAR | Status: DC | PRN
  Administered 2019-12-09: 4 mg via INTRAVENOUS

## 2019-12-09 MED ORDER — MORPHINE SULFATE (PF) 4 MG/ML IV SOLN: 0.5000 mg | INTRAVENOUS | Status: DC | PRN

## 2019-12-09 MED ORDER — ONDANSETRON HCL 4 MG PO TABS: 4.0000 mg | ORAL_TABLET | Freq: Every day | ORAL | 1 refills | Status: AC | PRN

## 2019-12-09 MED ORDER — HYDROCODONE-ACETAMINOPHEN 5-325 MG PO TABS
1.0000 | ORAL_TABLET | ORAL | Status: DC | PRN
  Administered 2019-12-09 (×2): 2 via ORAL
  Administered 2019-12-09: 1 via ORAL
  Administered 2019-12-10: 14:00:00 2 via ORAL
  Filled 2019-12-09 (×3): qty 2

## 2019-12-09 MED ORDER — SODIUM CHLORIDE (PF) 0.9 % IJ SOLN
INTRAMUSCULAR | Status: AC
  Filled 2019-12-09: qty 10

## 2019-12-09 MED ORDER — SCOPOLAMINE 1 MG/3DAYS TD PT72
MEDICATED_PATCH | TRANSDERMAL | Status: AC
  Filled 2019-12-09: qty 1

## 2019-12-09 MED ORDER — METHOCARBAMOL 500 MG IVPB - SIMPLE MED
500.0000 mg | Freq: Four times a day (QID) | INTRAVENOUS | Status: DC | PRN
  Administered 2019-12-09: 500 mg via INTRAVENOUS
  Filled 2019-12-09: qty 50

## 2019-12-09 MED ORDER — DEXAMETHASONE SODIUM PHOSPHATE 10 MG/ML IJ SOLN
INTRAMUSCULAR | Status: AC
  Filled 2019-12-09: qty 1

## 2019-12-09 MED ORDER — GABAPENTIN 100 MG PO CAPS
300.0000 mg | ORAL_CAPSULE | Freq: Every day | ORAL | Status: DC
  Administered 2019-12-09 – 2019-12-10 (×2): 300 mg via ORAL
  Filled 2019-12-09 (×2): qty 3

## 2019-12-09 MED ORDER — OXYCODONE HCL 5 MG PO TABS: 5.0000 mg | ORAL_TABLET | ORAL | 0 refills | Status: AC | PRN

## 2019-12-09 MED ORDER — SCOPOLAMINE 1 MG/3DAYS TD PT72
MEDICATED_PATCH | TRANSDERMAL | Status: DC | PRN
  Administered 2019-12-09: 1 via TRANSDERMAL

## 2019-12-09 MED ORDER — LINACLOTIDE 145 MCG PO CAPS
145.0000 ug | ORAL_CAPSULE | Freq: Every day | ORAL | Status: DC
  Filled 2019-12-09 (×3): qty 1

## 2019-12-09 MED ORDER — LIDOCAINE 2% (20 MG/ML) 5 ML SYRINGE
INTRAMUSCULAR | Status: DC | PRN
  Administered 2019-12-09: 100 mg via INTRAVENOUS

## 2019-12-09 MED ORDER — CEFAZOLIN SODIUM-DEXTROSE 2-4 GM/100ML-% IV SOLN
2.0000 g | INTRAVENOUS | Status: AC
  Administered 2019-12-09: 2 g via INTRAVENOUS
  Filled 2019-12-09: qty 100

## 2019-12-09 MED ORDER — MAGNESIUM CITRATE PO SOLN: 1.0000 | Freq: Once | ORAL | Status: DC | PRN

## 2019-12-09 MED ORDER — PROPOFOL 10 MG/ML IV BOLUS
INTRAVENOUS | Status: DC | PRN
  Administered 2019-12-09: 120 mg via INTRAVENOUS

## 2019-12-09 MED ORDER — SUGAMMADEX SODIUM 500 MG/5ML IV SOLN
INTRAVENOUS | Status: AC
  Filled 2019-12-09: qty 5

## 2019-12-09 MED ORDER — PANTOPRAZOLE SODIUM 40 MG PO TBEC
40.0000 mg | DELAYED_RELEASE_TABLET | Freq: Two times a day (BID) | ORAL | Status: DC
  Administered 2019-12-09 – 2019-12-11 (×4): 40 mg via ORAL
  Filled 2019-12-09 (×4): qty 1

## 2019-12-09 MED ORDER — SENNOSIDES-DOCUSATE SODIUM 8.6-50 MG PO TABS: 1.0000 | ORAL_TABLET | Freq: Every evening | ORAL | Status: DC | PRN

## 2019-12-09 MED ORDER — OXYCODONE HCL 5 MG PO TABS
5.0000 mg | ORAL_TABLET | ORAL | Status: DC | PRN
  Administered 2019-12-09: 15:00:00 5 mg via ORAL
  Filled 2019-12-09: qty 1

## 2019-12-09 MED ORDER — HYDROCODONE-ACETAMINOPHEN 5-325 MG PO TABS
ORAL_TABLET | ORAL | Status: AC
  Filled 2019-12-09: qty 1

## 2019-12-09 MED ORDER — HYDROMORPHONE HCL 1 MG/ML IJ SOLN
0.2500 mg | INTRAMUSCULAR | Status: DC | PRN
  Administered 2019-12-09 (×4): 0.5 mg via INTRAVENOUS

## 2019-12-09 MED ORDER — TRANEXAMIC ACID-NACL 1000-0.7 MG/100ML-% IV SOLN
1000.0000 mg | INTRAVENOUS | Status: DC
  Filled 2019-12-09: qty 100

## 2019-12-09 MED ORDER — DIPHENHYDRAMINE HCL 12.5 MG/5ML PO ELIX: 12.5000 mg | ORAL_SOLUTION | ORAL | Status: DC | PRN

## 2019-12-09 MED ORDER — MIDAZOLAM HCL 2 MG/2ML IJ SOLN
INTRAMUSCULAR | Status: AC
  Filled 2019-12-09: qty 2

## 2019-12-09 MED ORDER — TOPIRAMATE 100 MG PO TABS
100.0000 mg | ORAL_TABLET | Freq: Every day | ORAL | Status: DC
  Administered 2019-12-09 – 2019-12-10 (×2): 100 mg via ORAL
  Filled 2019-12-09 (×2): qty 1

## 2019-12-09 MED ORDER — FLUOXETINE HCL 20 MG PO CAPS
40.0000 mg | ORAL_CAPSULE | Freq: Every day | ORAL | Status: DC
  Administered 2019-12-10 – 2019-12-11 (×2): 40 mg via ORAL
  Filled 2019-12-09 (×2): qty 2

## 2019-12-09 MED ORDER — TRAMADOL HCL 50 MG PO TABS
50.0000 mg | ORAL_TABLET | Freq: Four times a day (QID) | ORAL | Status: DC
  Administered 2019-12-09 – 2019-12-11 (×6): 50 mg via ORAL
  Filled 2019-12-09 (×6): qty 1

## 2019-12-09 MED ORDER — ONDANSETRON HCL 4 MG PO TABS: 4.0000 mg | ORAL_TABLET | Freq: Four times a day (QID) | ORAL | Status: DC | PRN

## 2019-12-09 MED ORDER — ONDANSETRON HCL 4 MG/2ML IJ SOLN: 4.0000 mg | Freq: Four times a day (QID) | INTRAMUSCULAR | Status: DC | PRN

## 2019-12-09 MED ORDER — HYDROCHLOROTHIAZIDE 12.5 MG PO CAPS
12.5000 mg | ORAL_CAPSULE | Freq: Every day | ORAL | Status: DC
  Administered 2019-12-10 – 2019-12-11 (×2): 12.5 mg via ORAL
  Filled 2019-12-09 (×2): qty 1

## 2019-12-09 MED ORDER — METHOCARBAMOL 500 MG PO TABS
500.0000 mg | ORAL_TABLET | Freq: Four times a day (QID) | ORAL | Status: DC | PRN
  Administered 2019-12-09 – 2019-12-11 (×6): 500 mg via ORAL
  Filled 2019-12-09 (×6): qty 1

## 2019-12-09 MED ORDER — DEXAMETHASONE SODIUM PHOSPHATE 10 MG/ML IJ SOLN
8.0000 mg | Freq: Once | INTRAMUSCULAR | Status: AC
  Administered 2019-12-09: 8 mg via INTRAVENOUS

## 2019-12-09 MED ORDER — SUGAMMADEX SODIUM 200 MG/2ML IV SOLN
INTRAVENOUS | Status: DC | PRN
  Administered 2019-12-09: 250 mg via INTRAVENOUS

## 2019-12-09 MED ORDER — RIVAROXABAN 10 MG PO TABS
10.0000 mg | ORAL_TABLET | Freq: Every day | ORAL | Status: DC
  Administered 2019-12-10 – 2019-12-11 (×2): 10 mg via ORAL
  Filled 2019-12-09 (×2): qty 1

## 2019-12-09 MED ORDER — PROPOFOL 500 MG/50ML IV EMUL
INTRAVENOUS | Status: AC
  Filled 2019-12-09: qty 50

## 2019-12-09 MED ORDER — CANDESARTAN CILEXETIL-HCTZ 16-12.5 MG PO TABS: 1.0000 | ORAL_TABLET | Freq: Every morning | ORAL | Status: DC

## 2019-12-09 MED ORDER — ZOLPIDEM TARTRATE 5 MG PO TABS: 5.0000 mg | ORAL_TABLET | Freq: Every evening | ORAL | Status: DC | PRN

## 2019-12-09 MED ORDER — SODIUM CHLORIDE 0.9 % IV SOLN
INTRAVENOUS | Status: DC | PRN
  Administered 2019-12-09: 80 mL

## 2019-12-09 MED ORDER — IRBESARTAN 150 MG PO TABS
150.0000 mg | ORAL_TABLET | Freq: Every day | ORAL | Status: DC
  Administered 2019-12-10 – 2019-12-11 (×2): 150 mg via ORAL
  Filled 2019-12-09 (×2): qty 1

## 2019-12-09 MED ORDER — ONDANSETRON HCL 4 MG/2ML IJ SOLN
INTRAMUSCULAR | Status: AC
  Filled 2019-12-09: qty 2

## 2019-12-09 SURGICAL SUPPLY — 77 items
ADH SKN CLS APL DERMABOND .7 (GAUZE/BANDAGES/DRESSINGS) ×1
ATTUNE PSFEM LTSZ6 NARCEM KNEE (Femur) ×2 IMPLANT
ATTUNE PSRP INSR SZ6 5 KNEE (Insert) ×1 IMPLANT
ATTUNE PSRP INSR SZ6 5MM KNEE (Insert) ×1 IMPLANT
BAG SPEC THK2 15X12 ZIP CLS (MISCELLANEOUS) ×2
BAG ZIPLOCK 12X15 (MISCELLANEOUS) ×6 IMPLANT
BASE TIBIA ATTUNE KNEE SYS SZ6 (Knees) IMPLANT
BLADE SAG 18X100X1.27 (BLADE) ×3 IMPLANT
BLADE SAW SGTL 11.0X1.19X90.0M (BLADE) ×3 IMPLANT
BNDG CMPR MED 10X6 ELC LF (GAUZE/BANDAGES/DRESSINGS) ×1
BNDG ELASTIC 4X5.8 VLCR STR LF (GAUZE/BANDAGES/DRESSINGS) ×3 IMPLANT
BNDG ELASTIC 6X10 VLCR STRL LF (GAUZE/BANDAGES/DRESSINGS) ×2 IMPLANT
BNDG ELASTIC 6X5.8 VLCR STR LF (GAUZE/BANDAGES/DRESSINGS) ×3 IMPLANT
BONE CEMENT GENTAMICIN (Cement) ×6 IMPLANT
BOWL SMART MIX CTS (DISPOSABLE) ×3 IMPLANT
BSPLAT TIB 6 CMNT ROT PLAT STR (Knees) ×1 IMPLANT
CEMENT BONE GENTAMICIN 40 (Cement) IMPLANT
COVER SURGICAL LIGHT HANDLE (MISCELLANEOUS) ×3 IMPLANT
COVER WAND RF STERILE (DRAPES) IMPLANT
CUFF TOURN SGL QUICK 34 (TOURNIQUET CUFF) ×3
CUFF TRNQT CYL 34X4.125X (TOURNIQUET CUFF) ×1 IMPLANT
DECANTER SPIKE VIAL GLASS SM (MISCELLANEOUS) ×3 IMPLANT
DERMABOND ADVANCED (GAUZE/BANDAGES/DRESSINGS) ×2
DERMABOND ADVANCED .7 DNX12 (GAUZE/BANDAGES/DRESSINGS) ×1 IMPLANT
DRAPE INCISE IOBAN 85X60 (DRAPES) ×2 IMPLANT
DRAPE U-SHAPE 47X51 STRL (DRAPES) ×3 IMPLANT
DRSG AQUACEL AG ADV 3.5X10 (GAUZE/BANDAGES/DRESSINGS) ×3 IMPLANT
DRSG TEGADERM 4X4.75 (GAUZE/BANDAGES/DRESSINGS) ×3 IMPLANT
DRSG TEGADERM 6X8 (GAUZE/BANDAGES/DRESSINGS) ×2 IMPLANT
DURAPREP 26ML APPLICATOR (WOUND CARE) ×6 IMPLANT
ELECT REM PT RETURN 15FT ADLT (MISCELLANEOUS) ×3 IMPLANT
EVACUATOR 1/8 PVC DRAIN (DRAIN) ×3 IMPLANT
FACESHIELD WRAPAROUND (MASK) ×3 IMPLANT
FACESHIELD WRAPAROUND OR TEAM (MASK) IMPLANT
GAUZE SPONGE 2X2 8PLY STRL LF (GAUZE/BANDAGES/DRESSINGS) ×1 IMPLANT
GLOVE BIOGEL PI IND STRL 7.5 (GLOVE) ×1 IMPLANT
GLOVE BIOGEL PI IND STRL 8 (GLOVE) ×1 IMPLANT
GLOVE BIOGEL PI INDICATOR 7.5 (GLOVE) ×2
GLOVE BIOGEL PI INDICATOR 8 (GLOVE) ×2
GLOVE ECLIPSE 8.0 STRL XLNG CF (GLOVE) ×3 IMPLANT
GLOVE SURG ORTHO 9.0 STRL STRW (GLOVE) ×3 IMPLANT
GLOVE SURG SS PI 7.0 STRL IVOR (GLOVE) ×3 IMPLANT
GOWN STRL REUS W/TWL XL LVL3 (GOWN DISPOSABLE) ×6 IMPLANT
HANDPIECE INTERPULSE COAX TIP (DISPOSABLE) ×3
HOLDER FOLEY CATH W/STRAP (MISCELLANEOUS) IMPLANT
JET LAVAGE IRRISEPT WOUND (IRRIGATION / IRRIGATOR) ×3
KIT TURNOVER KIT A (KITS) IMPLANT
LAVAGE JET IRRISEPT WOUND (IRRIGATION / IRRIGATOR) ×1 IMPLANT
NS IRRIG 1000ML POUR BTL (IV SOLUTION) ×3 IMPLANT
PACK TOTAL KNEE CUSTOM (KITS) ×3 IMPLANT
PATELLA MEDIAL ATTUN 35MM KNEE (Knees) ×2 IMPLANT
PENCIL SMOKE EVACUATOR (MISCELLANEOUS) IMPLANT
PIN DRILL FIX HALF THREAD (BIT) ×2 IMPLANT
PIN FIXATION 1/8DIA X 3INL (PIN) ×2 IMPLANT
PIN STEINMAN FIXATION KNEE (PIN) ×2 IMPLANT
PROTECTOR NERVE ULNAR (MISCELLANEOUS) ×3 IMPLANT
SET HNDPC FAN SPRY TIP SCT (DISPOSABLE) ×1 IMPLANT
SET PAD KNEE POSITIONER (MISCELLANEOUS) ×3 IMPLANT
SPONGE GAUZE 2X2 STER 10/PKG (GAUZE/BANDAGES/DRESSINGS) ×2
SPONGE LAP 18X18 RF (DISPOSABLE) IMPLANT
SPONGE SURGIFOAM ABS GEL 100 (HEMOSTASIS) ×3 IMPLANT
STOCKINETTE 6  STRL (DRAPES) ×3
STOCKINETTE 6 STRL (DRAPES) ×1 IMPLANT
SUT BONE WAX W31G (SUTURE) IMPLANT
SUT MNCRL AB 3-0 PS2 18 (SUTURE) ×3 IMPLANT
SUT VIC AB 1 CT1 27 (SUTURE) ×9
SUT VIC AB 1 CT1 27XBRD ANTBC (SUTURE) ×3 IMPLANT
SUT VIC AB 2-0 CT1 27 (SUTURE) ×6
SUT VIC AB 2-0 CT1 TAPERPNT 27 (SUTURE) ×2 IMPLANT
SUT VLOC 180 0 24IN GS25 (SUTURE) ×3 IMPLANT
SYR 50ML LL SCALE MARK (SYRINGE) IMPLANT
TAPE STRIPS DRAPE STRL (GAUZE/BANDAGES/DRESSINGS) ×3 IMPLANT
TIBIA ATTUNE KNEE SYS BASE SZ6 (Knees) ×3 IMPLANT
TRAY CATH 16FR W/PLASTIC CATH (SET/KITS/TRAYS/PACK) ×3 IMPLANT
WATER STERILE IRR 1000ML POUR (IV SOLUTION) ×6 IMPLANT
WRAP KNEE MAXI GEL POST OP (GAUZE/BANDAGES/DRESSINGS) ×3 IMPLANT
YANKAUER SUCT BULB TIP 10FT TU (MISCELLANEOUS) ×3 IMPLANT

## 2019-12-09 NOTE — Anesthesia Preprocedure Evaluation (Addendum)
Anesthesia Evaluation  Patient identified by MRN, date of birth, ID band Patient awake    Reviewed: Allergy & Precautions, NPO status , Patient's Chart, lab work & pertinent test results  History of Anesthesia Complications (+) PONV  Airway Mallampati: II  TM Distance: >3 FB Neck ROM: Full    Dental   Pulmonary    Pulmonary exam normal        Cardiovascular hypertension, Pt. on medications Normal cardiovascular exam     Neuro/Psych    GI/Hepatic GERD  Medicated and Controlled,  Endo/Other  diabetes, Type 2  Renal/GU      Musculoskeletal   Abdominal   Peds  Hematology   Anesthesia Other Findings   Reproductive/Obstetrics                             Anesthesia Physical Anesthesia Plan  ASA: III  Anesthesia Plan: General   Post-op Pain Management:  Regional for Post-op pain   Induction: Intravenous  PONV Risk Score and Plan: 3 and Midazolam, Ondansetron and Scopolamine patch - Pre-op  Airway Management Planned: LMA  Additional Equipment:   Intra-op Plan:   Post-operative Plan:   Informed Consent: I have reviewed the patients History and Physical, chart, labs and discussed the procedure including the risks, benefits and alternatives for the proposed anesthesia with the patient or authorized representative who has indicated his/her understanding and acceptance.       Plan Discussed with: CRNA and Surgeon  Anesthesia Plan Comments:        Anesthesia Quick Evaluation

## 2019-12-09 NOTE — Anesthesia Procedure Notes (Signed)
Procedure Name: Intubation Date/Time: 12/09/2019 7:48 AM Performed by: Florene Route, CRNA Patient Re-evaluated:Patient Re-evaluated prior to induction Oxygen Delivery Method: Circle system utilized Preoxygenation: Pre-oxygenation with 100% oxygen Induction Type: IV induction Ventilation: Mask ventilation without difficulty and Oral airway inserted - appropriate to patient size Laryngoscope Size: Hyacinth Meeker and 2 Grade View: Grade I Tube type: Oral Tube size: 7.5 mm Number of attempts: 1 Airway Equipment and Method: Stylet Placement Confirmation: ETT inserted through vocal cords under direct vision,  positive ETCO2 and breath sounds checked- equal and bilateral Secured at: 21 cm Tube secured with: Tape Dental Injury: Teeth and Oropharynx as per pre-operative assessment

## 2019-12-09 NOTE — Evaluation (Signed)
Physical Therapy Evaluation Patient Details Name: Ana Baker MRN: [id number] DOB: Feb 06, 1966 Today's Date: 12/09/2019   History of Present Illness  Patient is 54 y.o. female s/p Lt TKA on 12/09/19 with PMH significant for spinal stenosis, osteopenia, OA, HTN, DM, obesity, GERD, anxiety, depression, anemia, prior DVT and PE, Rt TKA , spine surgery, gastric bypass, SBO and bowel resection.    Clinical Impression  Ana Baker is a 54 y.o. female POD 0 s/p Lt TKA. Patient reports independence with mobility at baseline. Patient is now limited by functional impairments (see PT problem list below) and requires min-mod assist for transfers and gait with RW. Patient was able to ambulate ~8 feet with RW and min assist; pt limited by pain today. Patient instructed in exercise to facilitate ROM and circulation. Patient will benefit from continued skilled PT interventions to address impairments and progress towards PLOF. Acute PT will follow to progress mobility and stair training in preparation for safe discharge home.     Follow Up Recommendations Follow surgeon's recommendation for DC plan and follow-up therapies    Equipment Recommendations  Rolling walker with 5" wheels    Recommendations for Other Services       Precautions / Restrictions Precautions Precautions: Fall Restrictions Weight Bearing Restrictions: No Other Position/Activity Restrictions: WBAT      Mobility  Bed Mobility Overal bed mobility: Needs Assistance Bed Mobility: Supine to Sit     Supine to sit: Min assist;HOB elevated     General bed mobility comments: cues for reaching to rail to pivot and assist to bring LE's off EOB.  Transfers Overall transfer level: Needs assistance Equipment used: Rolling walker (2 wheeled) Transfers: Stand Pivot Transfers;Sit to/from Stand Sit to Stand: Mod assist;From elevated surface;Min assist Stand pivot transfers: Min assist       General transfer comment: mod assist on  first attempt to rise from EOB due to knee pain, and pt returned to sit. bed elevated and min assist provided to rise to RW. Cues required for technique/hand position. Pt required min assist to sequence stand step transfer for [facility]. min assist to stand from [facility] and to steady while pt performed pericare independently.  Ambulation/Gait Ambulation/Gait assistance: Min assist Gait Distance (Feet): 8 Feet Assistive device: Rolling walker (2 wheeled) Gait Pattern/deviations: Step-to pattern;Decreased step length - right;Decreased step length - left;Decreased stride length;Decreased stance time - left;Decreased weight shift to left;Antalgic Gait velocity: decreased   General Gait Details: verbal cues for safe step patter with RW and to use UE's for support to reduce pressure on Lt LE. min assist needed to steady and to position walker during turns.  Stairs       Wheelchair Mobility    Modified Rankin (Stroke Patients Only)       Balance Overall balance assessment: Needs assistance Sitting-balance support: Feet supported Sitting balance-Leahy Scale: Good     Standing balance support: During functional activity;Bilateral upper extremity supported Standing balance-Leahy Scale: Poor            Pertinent Vitals/Pain Pain Assessment: 0-10 Pain Score: 6  Pain Location: Lt knee Pain Descriptors / Indicators: Aching;Discomfort;Moaning;Guarding;Grimacing Pain Intervention(s): Monitored during session;Limited activity within patient's tolerance;Repositioned;Premedicated before session;Ice applied    Home Living Family/patient expects to be discharged to:: Private residence Living Arrangements: Alone Available Help at Discharge: Family(aunt and uncle will assist her) Type of Home: House Home Access: Stairs to enter;Ramped entrance   Entrance Stairs-Number of Steps: 6 steps at front and ramp at back Home Layout:  One level Home Equipment: Cane - single point;None;Shower seat;Wheelchair -  manual      Prior Function Level of Independence: Independent               Hand Dominance   Dominant Hand: Right    Extremity/Trunk Assessment   Upper Extremity Assessment Upper Extremity Assessment: Overall WFL for tasks assessed    Lower Extremity Assessment Lower Extremity Assessment: LLE deficits/detail LLE Deficits / Details: pt with good quad activaiton, no extensor lag, pt limited by pain.  LLE Sensation: WNL LLE Coordination: WNL    Cervical / Trunk Assessment Cervical / Trunk Assessment: Normal  Communication   Communication: No difficulties  Cognition Arousal/Alertness: Awake/alert Behavior During Therapy: WFL for tasks assessed/performed Overall Cognitive Status: Within Functional Limits for tasks assessed         General Comments      Exercises Total Joint Exercises Ankle Circles/Pumps: AROM;Both;20 reps;Seated Quad Sets: AROM;5 reps;Seated;Left Heel Slides: AROM;5 reps;Seated;Left   Assessment/Plan    PT Assessment Patient needs continued PT services  PT Problem List Decreased strength;Decreased range of motion;Decreased activity tolerance;Decreased balance;Decreased mobility;Decreased knowledge of use of DME;Obesity;Decreased knowledge of precautions       PT Treatment Interventions Gait training;DME instruction;Stair training;Functional mobility training;Therapeutic activities;Therapeutic exercise;Balance training;Neuromuscular re-education;Patient/family education    PT Goals (Current goals can be found in the Care Plan section)  Acute Rehab PT Goals Patient Stated Goal: to return home and stop hurting PT Goal Formulation: With patient Time For Goal Achievement: 12/16/19 Potential to Achieve Goals: Good    Frequency 7X/week    AM-PAC PT "6 Clicks" Mobility  Outcome Measure Help needed turning from your back to your side while in a flat bed without using bedrails?: A Little Help needed moving from lying on your back to sitting on  the side of a flat bed without using bedrails?: A Little Help needed moving to and from a bed to a chair (including a wheelchair)?: A Little Help needed standing up from a chair using your arms (e.g., wheelchair or bedside chair)?: A Lot Help needed to walk in hospital room?: A Little Help needed climbing 3-5 steps with a railing? : A Lot 6 Click Score: 16    End of Session Equipment Utilized During Treatment: Gait belt Activity Tolerance: Patient tolerated treatment well Patient left: in chair;with call bell/phone within reach;with chair alarm set;with family/visitor present Nurse Communication: Mobility status PT Visit Diagnosis: Muscle weakness (generalized) (M62.81);Difficulty in walking, not elsewhere classified (R26.2)    Time: 0867-6195 PT Time Calculation (min) (ACUTE ONLY): 33 min   Charges:   PT Evaluation $PT Eval Low Complexity: 1 Low PT Treatments $Therapeutic Activity: 8-22 mins        Verner Mould, DPT Physical Therapist with [facility] [phone]  12/09/2019 6:23 PM

## 2019-12-09 NOTE — Transfer of Care (Signed)
Immediate Anesthesia Transfer of Care Note  Patient: Ana Baker  Procedure(s) Performed: TOTAL KNEE ARTHROPLASTY (Left Knee)  Patient Location: PACU  Anesthesia Type:GA combined with regional for post-op pain  Level of Consciousness: drowsy  Airway & Oxygen Therapy: Patient Spontanous Breathing and Patient connected to face mask oxygen  Post-op Assessment: Report given to RN and Post -op Vital signs reviewed and stable  Post vital signs: Reviewed and stable  Last Vitals:  Vitals Value Taken Time  BP 131/70 12/09/19 1015  Temp    Pulse 76 12/09/19 1017  Resp 9 12/09/19 1017  SpO2 100 % 12/09/19 1017  Vitals shown include unvalidated device data.  Last Pain:  Vitals:   12/09/19 0626  TempSrc:   PainSc: 4       Patients Stated Pain Goal: 3 (12/09/19 4497)  Complications: No apparent anesthesia complications

## 2019-12-09 NOTE — Anesthesia Postprocedure Evaluation (Signed)
Anesthesia Post Note  Patient: Ana Baker  Procedure(s) Performed: TOTAL KNEE ARTHROPLASTY (Left Knee)     Patient location during evaluation: PACU Anesthesia Type: Spinal Level of consciousness: awake and alert Pain management: pain level controlled Vital Signs Assessment: post-procedure vital signs reviewed and stable Respiratory status: spontaneous breathing, nonlabored ventilation, respiratory function stable and patient connected to nasal cannula oxygen Cardiovascular status: blood pressure returned to baseline and stable Postop Assessment: no apparent nausea or vomiting Anesthetic complications: no    Last Vitals:  Vitals:   12/09/19 1200 12/09/19 1300  BP: (!) 103/53 111/62  Pulse: 72 (!) 52  Resp: 16 18  Temp: 36.8 C 36.8 C  SpO2: 100% 100%    Last Pain:  Vitals:   12/09/19 1300  TempSrc:   PainSc: Asleep                 [name] DAVID

## 2019-12-09 NOTE — Interval H&P Note (Signed)
History and Physical Interval Note:  12/09/2019 7:41 AM  Ana Baker  has presented today for surgery, with the diagnosis of Left knee osteoarthritis.  The various methods of treatment have been discussed with the patient and family. After consideration of risks, benefits and other options for treatment, the patient has consented to  Procedure(s) with comments: TOTAL KNEE ARTHROPLASTY (Left) - adductor canal as a surgical intervention.  The patient's history has been reviewed, patient examined, no change in status, stable for surgery.  I have reviewed the patient's chart and labs.  Questions were answered to the patient's satisfaction.     [name] ANDREW

## 2019-12-09 NOTE — Op Note (Signed)
DATE OF SURGERY:  12/09/2019  TIME: 9:49 AM  PATIENT NAME:  Ana Baker    AGE: 54 y.o.   PRE-OPERATIVE DIAGNOSIS:  Left knee osteoarthritis  POST-OPERATIVE DIAGNOSIS:  Left knee osteoarthritis  PROCEDURE:  Procedure(s): TOTAL KNEE ARTHROPLASTY  SURGEON:  Lexia Vandevender ANDREW  ASSISTANT:  Leeanne Haus, PA-C, present and scrubbed throughout the case, critical for assistance with exposure, retraction, instrumentation, and closure.  OPERATIVE IMPLANTS: Depuy PFC Attune Rotating Platform.  Femur size 6, Tibia size 6, Patella size 35 3-peg oval button, with a 5 mm polyethylene insert.   PREOPERATIVE INDICATIONS:   Ana Baker is a 54 y.o. year old female with end stage bone on bone arthritis of the knee who failed conservative treatment and elected for Total Knee Arthroplasty.   The risks, benefits, and alternatives were discussed at length including but not limited to the risks of infection, bleeding, nerve injury, stiffness, blood clots, the need for revision surgery, cardiopulmonary complications, among others, and they were willing to proceed.  OPERATIVE DESCRIPTION:  The patient was brought to the operative room and placed in a supine position.  Spinal anesthesia was administered.  IV antibiotics were given.  The lower extremity was prepped and draped in the usual sterile fashion.  Time out was performed.  The leg was elevated and exsanguinated and the tourniquet was inflated.  Anterior quadriceps tendon splitting approach was performed.  The patella was retracted and osteophytes were removed.  The anterior horn of the medial and lateral meniscus was removed and cruciate ligaments resected.   The distal femur was opened with the drill and the intramedullary distal femoral cutting jig was utilized, set at 5 degrees resecting 10 mm off the distal femur.  Care was taken to protect the collateral ligaments.  The distal femoral sizing jig was applied, taking care to avoid  notching.  Then the 4-in-1 cutting jig was applied and the anterior and posterior femur was cut, along with the chamfer cuts.    Then the extramedullary tibial cutting jig was utilized making the appropriate cut using the anterior tibial crest as a reference building in appropriate posterior slope.  Care was taken during the cut to protect the medial and collateral ligaments.  The proximal tibia was removed along with the posterior horns of the menisci.   The posterior medial femoral osteophytes and posterior lateral femoral osteophytes were removed.    The flexion gap was then measured and was symmetric with the extension gap, measured at 5.  I completed the distal femoral preparation using the appropriate jig to prepare the box.  The patella was then measured, and cut with the saw.    The proximal tibia sized and prepared accordingly with the reamer and the punch, and then all components were trialed with the trial insert.  The knee was found to have excellent balance and full motion.    The above named components were then cemented into place and all excess cement was removed.  The trial polyethylene component was in place during cementation, and then was exchanged for the real polyethylene component.    The knee was easily taken through a range of motion and the patella tracked well and the knee irrigated copiously and the parapatellar and subcutaneous tissue closed with vicryl, and monocryl with steri strips for the skin.  The arthrotomy was closed at 90 of flexion. The wounds were dressed with sterile gauze and the tourniquet released and the patient was awakened and returned to the PACU in  stable and satisfactory condition.  There were no complications.  Total tourniquet time was 80 minutes.

## 2019-12-09 NOTE — Anesthesia Procedure Notes (Signed)
Date/Time: 12/09/2019 7:17 AM Performed by: Florene Route, CRNA Oxygen Delivery Method: Nasal cannula

## 2019-12-10 DIAGNOSIS — Z86718 Personal history of other venous thrombosis and embolism: Secondary | ICD-10-CM | POA: Diagnosis not present

## 2019-12-10 DIAGNOSIS — M1712 Unilateral primary osteoarthritis, left knee: Secondary | ICD-10-CM | POA: Diagnosis not present

## 2019-12-10 DIAGNOSIS — Z79899 Other long term (current) drug therapy: Secondary | ICD-10-CM | POA: Diagnosis not present

## 2019-12-10 DIAGNOSIS — I10 Essential (primary) hypertension: Secondary | ICD-10-CM | POA: Diagnosis not present

## 2019-12-10 DIAGNOSIS — Z96651 Presence of right artificial knee joint: Secondary | ICD-10-CM | POA: Diagnosis not present

## 2019-12-10 DIAGNOSIS — E119 Type 2 diabetes mellitus without complications: Secondary | ICD-10-CM | POA: Diagnosis not present

## 2019-12-10 DIAGNOSIS — Z7901 Long term (current) use of anticoagulants: Secondary | ICD-10-CM | POA: Diagnosis not present

## 2019-12-10 DIAGNOSIS — Z86711 Personal history of pulmonary embolism: Secondary | ICD-10-CM | POA: Diagnosis not present

## 2019-12-10 LAB — HIV ANTIBODY (ROUTINE TESTING W REFLEX): HIV Screen 4th Generation wRfx: NONREACTIVE

## 2019-12-10 NOTE — Plan of Care (Signed)
Progressing well.

## 2019-12-10 NOTE — TOC Initial Note (Signed)
Transition of Care [facility]) - Initial/Assessment Note    Patient Details  Name: Ana Baker MRN: [id number] Date of Birth: 05/23/1966  Transition of Care [facility]) CM/SW Contact:    Armanda Heritage, RN Phone Number: 12/10/2019, 11:06 AM  Clinical Narrative:         CM spoke with patient at bedside.  Patient plans to discharge home with outpatient physical therapy.  [employer] to deliver rolling walker to bedside for home use.            Expected Discharge Plan: Home/Self Care     Patient Goals and CMS Choice Patient states their goals for this hospitalization and ongoing recovery are:: to go to outpatient therapy      Expected Discharge Plan and Services Expected Discharge Plan: Home/Self Care   Discharge Planning Services: CM Consult   Living arrangements for the past 2 months: Single Family Home Expected Discharge Date: 12/10/19               DME Arranged: [name] rolling DME Agency: [employer] Date DME Agency Contacted: 12/10/19 Time DME Agency Contacted: 1105 Representative spoke with at DME Agency: [facility] HH Arranged: NA HH Agency: NA        Prior Living Arrangements/Services Living arrangements for the past 2 months: Single Family Home   Patient language and need for interpreter reviewed:: Yes Do you feel safe going back to the place where you live?: Yes      Need for Family Participation in Patient Care: No (Comment) Care giver support system in place?: No (comment)   Criminal Activity/Legal Involvement Pertinent to Current Situation/Hospitalization: No - Comment as needed  Activities of Daily Living Home Assistive Devices/Equipment: Eyeglasses, CBG Meter, Blood pressure cuff, Shower chair with back, Raised toilet seat with rails, Cane (specify quad or straight), Wheelchair ADL Screening (condition at time of admission) Patient's cognitive ability adequate to safely complete daily activities?: Yes Is the patient deaf or have difficulty hearing?: No Does the  patient have difficulty seeing, even when wearing glasses/contacts?: No Does the patient have difficulty concentrating, remembering, or making decisions?: No Patient able to express need for assistance with ADLs?: Yes Does the patient have difficulty dressing or bathing?: No Independently performs ADLs?: Yes (appropriate for developmental age) Does the patient have difficulty walking or climbing stairs?: Yes Weakness of Legs: Left Weakness of Arms/Hands: None  Permission Sought/Granted                  Emotional Assessment Appearance:: Appears stated age Attitude/Demeanor/Rapport: Engaged Affect (typically observed): Accepting Orientation: : Oriented to Self, Oriented to Place, Oriented to  Time, Oriented to Situation   Psych Involvement: No (comment)  Admission diagnosis:  Osteoarthritis of left knee [M17.12] Patient Active Problem List   Diagnosis Date Noted  . Osteoarthritis of left knee 12/09/2019  . DVT, lower extremity (HCC) 10/07/2013  . Hx pulmonary embolism 10/07/2013  . Spinal stenosis at L4-L5 level 10/07/2013  . Obesity (BMI 35.0-39.9 without comorbidity) 10/07/2013  . Chronic anticoagulation 10/07/2013  . Abdominal pain, acute 03/21/2011   PCP:  Birdena Jubilee, MD (Inactive) Pharmacy:   [facility] [phone] - [place], [place] - [address] [place] [address] Phone: [phone] Fax: [phone]     Social Determinants of Health (SDOH) Interventions    Readmission Risk Interventions No flowsheet data found.

## 2019-12-10 NOTE — Progress Notes (Signed)
Physical Therapy Treatment Patient Details Name: CHANIAH CISSE MRN: [id number] DOB: [birth year] Today's Date: 12/10/2019    History of Present Illness Patient is 54 y.o. female s/p Lt TKA on 12/09/19 with PMH significant for spinal stenosis, osteopenia, OA, HTN, DM, obesity, GERD, anxiety, depression, anemia, prior DVT and PE, Rt TKA , spine surgery, gastric bypass, SBO and bowel resection.    PT Comments    POD # 1 pm session Assisted OOB to amb an increased distance.  General bed mobility comments: demonstarted and instructed how to use a belt to self assist LE.  General transfer comment: increased time and 25% VC's on proper hand placement as well as safety with turns.General Gait Details: 25% VC's on proper upright posture and safety with turns.  Also required increased time.  Slow but steady.Then returned to room to perform some TE's following HEP handout.  Instructed on proper tech, freq as well as use of ICE.   Pt plans to D/C to home tomorrow with family help.    Follow Up Recommendations  Follow surgeon's recommendation for DC plan and follow-up therapies;Outpatient PT     Equipment Recommendations  Rolling walker with 5" wheels    Recommendations for Other Services       Precautions / Restrictions Precautions Precautions: Fall Precaution Comments: instructed no pillow under knee Restrictions Weight Bearing Restrictions: No Other Position/Activity Restrictions: WBAT    Mobility  Bed Mobility Overal bed mobility: Needs Assistance Bed Mobility: Supine to Sit;Sit to Supine     Supine to sit: Supervision Sit to supine: Supervision   General bed mobility comments: demonstarted and instructed how to use a belt to self assist LE  Transfers Overall transfer level: Needs assistance Equipment used: Rolling walker (2 wheeled) Transfers: Stand Pivot Transfers;Sit to/from Stand Sit to Stand: Min guard Stand pivot transfers: Min guard;Min assist       General transfer  comment: increased time and 25% VC's on proper hand placement as well as safety with turns.  Ambulation/Gait Ambulation/Gait assistance: Supervision;Min guard Gait Distance (Feet): 28 Feet Assistive device: Rolling walker (2 wheeled)   Gait velocity: decreased   General Gait Details: 25% VC's on proper upright posture and safety with turns.  Also required increased time.  Slow but steady.   Stairs Stairs: (pt hasa ramp)           Wheelchair Mobility    Modified Rankin (Stroke Patients Only)       Balance                                            Cognition Arousal/Alertness: Awake/alert Behavior During Therapy: WFL for tasks assessed/performed Overall Cognitive Status: Within Functional Limits for tasks assessed                                        Exercises   Total Knee Replacement TE's following HEP handout 10 reps B LE ankle pumps 05 reps towel squeezes 05 reps knee presses 05 reps heel slides  05 reps SAQ's 05 reps SLR's 05 reps ABD Educated on use of gait belt to assist with TE's Followed by ICE     General Comments        Pertinent Vitals/Pain Pain Assessment: 0-10 Pain Score: 6  Pain Location: Lt knee  Pain Descriptors / Indicators: Aching;Discomfort;Moaning;Guarding;Grimacing Pain Intervention(s): Monitored during session;Repositioned;Ice applied;Premedicated before session    Home Living                      Prior Function            PT Goals (current goals can now be found in the care plan section) Progress towards PT goals: Progressing toward goals    Frequency    7X/week      PT Plan Current plan remains appropriate    Co-evaluation              AM-PAC PT "6 Clicks" Mobility   Outcome Measure  Help needed turning from your back to your side while in a flat bed without using bedrails?: A Little Help needed moving from lying on your back to sitting on the side of a flat  bed without using bedrails?: A Little Help needed moving to and from a bed to a chair (including a wheelchair)?: A Little Help needed standing up from a chair using your arms (e.g., wheelchair or bedside chair)?: A Little Help needed to walk in hospital room?: A Little Help needed climbing 3-5 steps with a railing? : A Little 6 Click Score: 18    End of Session Equipment Utilized During Treatment: Gait belt Activity Tolerance: Patient tolerated treatment well Patient left: in bed Nurse Communication: Mobility status PT Visit Diagnosis: Muscle weakness (generalized) (M62.81);Difficulty in walking, not elsewhere classified (R26.2)     Time: 1335-1400 PT Time Calculation (min) (ACUTE ONLY): 25 min  Charges:  $Gait Training: 8-22 mins $Therapeutic Exercise: 8-22 mins                     Felecia Shelling  PTA Acute  Rehabilitation Services Pager      [phone] Office      [phone]

## 2019-12-10 NOTE — Progress Notes (Signed)
Physical Therapy Treatment Patient Details Name: Ana Baker MRN: [id number] DOB: 01/27/1966 Today's Date: 12/10/2019    History of Present Illness Patient is 54 y.o. female s/p Lt TKA on 12/09/19 with PMH significant for spinal stenosis, osteopenia, OA, HTN, DM, obesity, GERD, anxiety, depression, anemia, prior DVT and PE, Rt TKA , spine surgery, gastric bypass, SBO and bowel resection.    PT Comments    POD # 1am session Assisted OOB.  General bed mobility comments: demonstarted and instructed how to use a belt to self assist LE.  General transfer comment: increased time and 25% VC's on proper hand placement as well as safety with turns. General Gait Details: 25% VC's on proper upright posture and safety with turns.  Also required increased time.  Slow but steady. Then returned to room to perform some TE's following HEP handout.  Instructed on proper tech, freq as well as use of ICE.     Follow Up Recommendations  Follow surgeon's recommendation for DC plan and follow-up therapies;Outpatient PT     Equipment Recommendations  Rolling walker with 5" wheels    Recommendations for Other Services       Precautions / Restrictions Precautions Precautions: Fall Precaution Comments: instructed no pillow under knee Restrictions Weight Bearing Restrictions: No Other Position/Activity Restrictions: WBAT    Mobility  Bed Mobility Overal bed mobility: Needs Assistance Bed Mobility: Supine to Sit     Supine to sit: Supervision;Min guard     General bed mobility comments: demonstarted and instructed how to use a belt to self assist LE  Transfers Overall transfer level: Needs assistance Equipment used: Rolling walker (2 wheeled) Transfers: Stand Pivot Transfers;Sit to/from Stand Sit to Stand: Min guard Stand pivot transfers: Min guard;Min assist       General transfer comment: increased time and 25% VC's on proper hand placement as well as safety with  turns.  Ambulation/Gait Ambulation/Gait assistance: Supervision;Min guard Gait Distance (Feet): 22 Feet Assistive device: Rolling walker (2 wheeled)   Gait velocity: decreased   General Gait Details: 25% VC's on proper upright posture and safety with turns.  Also required increased time.  Slow but steady.   Stairs             Wheelchair Mobility    Modified Rankin (Stroke Patients Only)       Balance                                            Cognition Arousal/Alertness: Awake/alert Behavior During Therapy: WFL for tasks assessed/performed Overall Cognitive Status: Within Functional Limits for tasks assessed                                        Exercises   Total Knee Replacement TE's following HEP handout 10 reps B LE ankle pumps 05 reps towel squeezes 05 reps knee presses 05 reps heel slides  05 reps SAQ's 05 reps SLR's 05 reps ABD Educated on use of gait belt to assist with TE's Followed by ICE    General Comments        Pertinent Vitals/Pain Pain Assessment: 0-10 Pain Score: 6  Pain Location: Lt knee Pain Descriptors / Indicators: Aching;Discomfort;Moaning;Guarding;Grimacing Pain Intervention(s): Monitored during session;Repositioned;Ice applied;Premedicated before session    Home Living  Prior Function            PT Goals (current goals can now be found in the care plan section) Progress towards PT goals: Progressing toward goals    Frequency    7X/week      PT Plan Current plan remains appropriate    Co-evaluation              AM-PAC PT "6 Clicks" Mobility   Outcome Measure  Help needed turning from your back to your side while in a flat bed without using bedrails?: A Little Help needed moving from lying on your back to sitting on the side of a flat bed without using bedrails?: A Little Help needed moving to and from a bed to a chair (including a  wheelchair)?: A Little Help needed standing up from a chair using your arms (e.g., wheelchair or bedside chair)?: A Little Help needed to walk in hospital room?: A Little Help needed climbing 3-5 steps with a railing? : A Little 6 Click Score: 18    End of Session Equipment Utilized During Treatment: Gait belt Activity Tolerance: Patient tolerated treatment well Patient left: in chair;with call bell/phone within reach;with chair alarm set;with family/visitor present Nurse Communication: Mobility status PT Visit Diagnosis: Muscle weakness (generalized) (M62.81);Difficulty in walking, not elsewhere classified (R26.2)     Time: 1020-1045 PT Time Calculation (min) (ACUTE ONLY): 25 min  Charges:  $Gait Training: 8-22 mins $Therapeutic Exercise: 8-22 mins                     Felecia Shelling  PTA Acute  Rehabilitation Services Pager      [phone] Office      [phone]

## 2019-12-10 NOTE — Progress Notes (Signed)
   Subjective:  Patient reports pain as moderate.  She has no complaints of shortness of breath or chest pain this morning.  She denies night sweats or chills.  She states that her sensation is intact in the left knee and she feels as though the pain is more significant than she recalls with her right knee replacement.  Objective:   VITALS:   Vitals:   12/09/19 2100 12/10/19 0153 12/10/19 0216 12/10/19 0523  BP: (!) 122/48 (!) 112/55  (!) 111/47  Pulse: 75 74  75  Resp: 16 16  15   Temp: 98.4 F (36.9 C) 98.6 F (37 C)  98.8 F (37.1 C)  TempSrc: Oral     SpO2: 96% (!) 88% 96% 98%  Weight:      Height:        Neurovascular intact Sensation intact distally Intact pulses distally Dorsiflexion/Plantar flexion intact Incision: dressing C/D/I Compartment soft Hemovac drain pulled  Lab Results  Component Value Date   WBC 5.4 12/01/2019   HGB 11.0 (L) 12/01/2019   HCT 36.7 12/01/2019   MCV 87.4 12/01/2019   PLT 312 12/01/2019   BMET    Component Value Date/Time   NA 136 12/01/2019 1328   K 3.8 12/01/2019 1328   CL 107 12/01/2019 1328   CO2 21 (L) 12/01/2019 1328   GLUCOSE 92 12/01/2019 1328   BUN 17 12/01/2019 1328   CREATININE 0.83 12/01/2019 1328   CREATININE 0.73 05/02/2014 0826   CALCIUM 8.4 (L) 12/01/2019 1328   GFRNONAA >60 12/01/2019 1328   GFRAA >60 12/01/2019 1328     Assessment/Plan: 1 Day Post-Op   Active Problems:   Osteoarthritis of left knee   Up with therapy -PT while in-house.  Anticipate discharge home probably tomorrow.  -History of coagulopathy and has resumed her maintenance dosing of Xarelto.  SCDs while in bed.     01/31/2020 12/10/2019, 9:12 AM   12/12/2019, MD [phone]

## 2019-12-11 ENCOUNTER — Observation Stay (HOSPITAL_BASED_OUTPATIENT_CLINIC_OR_DEPARTMENT_OTHER): Payer: BC Managed Care – PPO

## 2019-12-11 DIAGNOSIS — Z7901 Long term (current) use of anticoagulants: Secondary | ICD-10-CM | POA: Diagnosis not present

## 2019-12-11 DIAGNOSIS — M79609 Pain in unspecified limb: Secondary | ICD-10-CM

## 2019-12-11 DIAGNOSIS — E119 Type 2 diabetes mellitus without complications: Secondary | ICD-10-CM | POA: Diagnosis not present

## 2019-12-11 DIAGNOSIS — Z86718 Personal history of other venous thrombosis and embolism: Secondary | ICD-10-CM | POA: Diagnosis not present

## 2019-12-11 DIAGNOSIS — I10 Essential (primary) hypertension: Secondary | ICD-10-CM | POA: Diagnosis not present

## 2019-12-11 DIAGNOSIS — Z79899 Other long term (current) drug therapy: Secondary | ICD-10-CM | POA: Diagnosis not present

## 2019-12-11 DIAGNOSIS — Z86711 Personal history of pulmonary embolism: Secondary | ICD-10-CM | POA: Diagnosis not present

## 2019-12-11 DIAGNOSIS — Z96651 Presence of right artificial knee joint: Secondary | ICD-10-CM | POA: Diagnosis not present

## 2019-12-11 DIAGNOSIS — M1712 Unilateral primary osteoarthritis, left knee: Secondary | ICD-10-CM | POA: Diagnosis not present

## 2019-12-11 DIAGNOSIS — M6281 Muscle weakness (generalized): Secondary | ICD-10-CM | POA: Diagnosis not present

## 2019-12-11 NOTE — Discharge Summary (Signed)
Physician Discharge Summary  Patient ID: Ana Baker MRN: [id number] DOB/AGE: Jan 02, 1966 54 y.o.  Admit date: 12/09/2019 Discharge date: 12/11/2019  Admission Diagnoses: Left knee OA, hx of: abd pain, LLE DVT, PE, spinal stinosis, obesity, and chronic anticoagulopathy  Discharge Diagnoses:  Active Problems:   Osteoarthritis of left knee same as above  Discharged Condition: stable  Hospital Course: Patient presented to [facility] on 12/09/19 for elective L TKA by Dr. Sydnee Cabal.  The patient tolerated the procedure well and was admitted to the hospital.  On the night of 12/10/19 she experienced L calf pain after working with therapy earlier in the day.  She has a hx of L LE DVT.  Xarelto was initiated earlier that day.  Doppler was ordered, and it was negative for DVT.  Patient will remain on her Xarelto.  She worked well with therapy.  D/C'd home on 12/11/19.    Consults: PT/OT  Significant Diagnostic Studies: Doppler to r/o DVT  Treatments: IV hydration, antibiotics: Ancef, analgesia: acetaminophen,tramadol, acetaminophen w/ codeine, Vicodin, oxycodone and Morphine, cardiac meds: HCTZ and irbesarta, anticoagulation: Xarelto and surgery: as stated above.  Discharge Exam: Blood pressure (!) 102/58, pulse 77, temperature 97.7 F (36.5 C), temperature source Oral, resp. rate 16, height 5\' 8"  (1.727 m), weight 113.9 kg, SpO2 100 %. General: WDWN patient in NAD. Psych:  Appropriate mood and affect. Neuro:  A&O x 3, Moving all extremities, sensation intact to light touch HEENT:  EOMs intact Chest:  Even non-labored respirations Skin:  Dressing D/I with scant dry blood, no rashes or lesions Extremities: warm/dry, mild edema, erythema or echymosis.  No lymphadenopathy.  Left calf tenderness to palpation Pulses: Popliteus 2+ MSK:  ROM: lacks 5 degrees TKE, MMT: able to perform quad set   Disposition: Discharge disposition: 01-Home or Self Care       Discharge Instructions     Call MD / Call 911   Complete by: As directed    If you experience chest pain or shortness of breath, CALL 911 and be transported to the hospital emergency room.  If you develope a fever above 101 F, pus (white drainage) or increased drainage or redness at the wound, or calf pain, call your surgeon's office.   Call MD / Call 911   Complete by: As directed    If you experience chest pain or shortness of breath, CALL 911 and be transported to the hospital emergency room.  If you develope a fever above 101 F, pus (white drainage) or increased drainage or redness at the wound, or calf pain, call your surgeon's office.   Constipation Prevention   Complete by: As directed    Drink plenty of fluids.  Prune juice may be helpful.  You may use a stool softener, such as Colace (over the counter) 100 mg twice a day.  Use MiraLax (over the counter) for constipation as needed.   Constipation Prevention   Complete by: As directed    Drink plenty of fluids.  Prune juice may be helpful.  You may use a stool softener, such as Colace (over the counter) 100 mg twice a day.  Use MiraLax (over the counter) for constipation as needed.   Diet - low sodium heart healthy   Complete by: As directed    Diet - low sodium heart healthy   Complete by: As directed    Discharge instructions   Complete by: As directed    Dr. Sydnee Cabal [facility] [address] [place], [place] [address] [phone]  TOTAL KNEE REPLACEMENT POSTOPERATIVE DIRECTIONS  Knee Rehabilitation, Guidelines Following Surgery  Results after knee surgery are often greatly improved when you follow the exercise, range of motion and muscle strengthening exercises prescribed by your doctor. Safety measures are also important to protect the knee from further injury. Any time any of these exercises cause you to have increased pain or swelling in your knee joint, decrease the amount until you are comfortable again and slowly increase  them. If you have problems or questions, call your caregiver or physical therapist for advice.   HOME CARE INSTRUCTIONS  Remove items at home which could result in a fall. This includes throw rugs or furniture in walking pathways.  ICE to the affected knee every three hours for 30 minutes at a time and then as needed for pain and swelling.  Continue to use ice on the knee for pain and swelling from surgery. You may notice swelling that will progress down to the foot and ankle.  This is normal after surgery.  Elevate the leg when you are not up walking on it.   Continue to use the breathing machine which will help keep your temperature down.  It is common for your temperature to cycle up and down following surgery, especially at night when you are not up moving around and exerting yourself.  The breathing machine keeps your lungs expanded and your temperature down. Do not place pillow under knee, focus on keeping the knee straight while resting   DIET You may resume your previous home diet once your are discharged from the hospital.  DRESSING / WOUND CARE / SHOWERING Keep the surgical dressing until follow up.  The dressing is water proof, but you need to put extra covering over it like plastic wrap.  IF THE DRESSING FALLS OFF or the wound gets wet inside, change the dressing with sterile gauze.  Please use good hand washing techniques before changing the dressing.  Do not use any lotions or creams on the incision until instructed by your surgeon.   You may start showering once you are discharged home but do not submerge the incision under water.  You are sent home with an ACE bandage on over the leg, this can be removed at 3 days from surgery. At this time you can start showering. Please place the white TED stocking on the surgical leg after. This needs to be worn on the surgical leg for 2 weeks after surgery.   ACTIVITY Walk with your walker as instructed. Use walker as long as suggested by your  caregivers. Avoid periods of inactivity such as sitting longer than an hour when not asleep. This helps prevent blood clots.  You may resume a sexual relationship in one month or when given the OK by your doctor.  You may return to work once you are cleared by your doctor.  Do not drive a car for 6 weeks or until released by you surgeon.  Do not drive while taking narcotics.  WEIGHT BEARING Weight bearing as tolerated with assist device (walker, cane, etc) as directed, use it as long as suggested by your surgeon or therapist, typically at least 4-6 weeks.  POSTOPERATIVE CONSTIPATION PROTOCOL Constipation - defined medically as fewer than three stools per week and severe constipation as less than one stool per week.  One of the most common issues patients have following surgery is constipation.  Even if you have a regular bowel pattern at home, your  normal regimen is likely to be disrupted due to multiple reasons following surgery.  Combination of anesthesia, postoperative narcotics, change in appetite and fluid intake all can affect your bowels.  In order to avoid complications following surgery, here are some recommendations in order to help you during your recovery period.  Colace (docusate) - Pick up an over-the-counter form of Colace or another stool softener and take twice a day as long as you are requiring postoperative pain medications.  Take with a full glass of water daily.  If you experience loose stools or diarrhea, hold the colace until you stool forms back up.  If your symptoms do not get better within 1 week or if they get worse, check with your doctor.  Dulcolax (bisacodyl) - Pick up over-the-counter and take as directed by the product packaging as needed to assist with the movement of your bowels.  Take with a full glass of water.  Use this product as needed if not relieved by Colace only.   MiraLax (polyethylene glycol) - Pick up over-the-counter to have on hand.  MiraLax is a  solution that will increase the amount of water in your bowels to assist with bowel movements.  Take as directed and can mix with a glass of water, juice, soda, coffee, or tea.  Take if you go more than two days without a movement. Do not use MiraLax more than once per day. Call your doctor if you are still constipated or irregular after using this medication for 7 days in a row.  If you continue to have problems with postoperative constipation, please contact the office for further assistance and recommendations.  If you experience "the worst abdominal pain ever" or develop nausea or vomiting, please contact the office immediatly for further recommendations for treatment.  ITCHING  If you experience itching with your medications, try taking only a single pain pill, or even half a pain pill at a time.  You can also use Benadryl over the counter for itching or also to help with sleep.   TED HOSE STOCKINGS Wear the elastic stockings on both legs for two weeks following surgery during the day but you may remove then at night for sleeping.  Okay to remove ACE in 3 days, put TED on after  MEDICATIONS See your medication summary on the "After Visit Summary" that the nursing staff will review with you prior to discharge.  You may have some home medications which will be placed on hold until you complete the course of blood thinner medication.  It is important for you to complete the blood thinner medication as prescribed by your surgeon.  Continue your approved medications as instructed at time of discharge. If you were not previously taking any blood thinners prior to surgery please start taking Aspirin 325 mg tabs twice daily for 6 weeks. If you are unable to take Aspirin please let your doctor know.   PRECAUTIONS If you experience chest pain or shortness of breath - call 911 immediately for transfer to the hospital emergency department.  If you develop a fever greater that 101 F, purulent drainage  from wound, increased redness or drainage from wound, foul odor from the wound/dressing, or calf pain - CONTACT YOUR SURGEON.  FOLLOW-UP APPOINTMENTS Make sure you keep all of your appointments after your operation with your surgeon and caregivers. You should call the office at the above phone number and make an appointment for approximately two weeks after the date of your surgery or on the date instructed by your surgeon outlined in the "After Visit Summary".   RANGE OF MOTION AND STRENGTHENING EXERCISES  Rehabilitation of the knee is important following a knee injury or an operation. After just a few days of immobilization, the muscles of the thigh which control the knee become weakened and shrink (atrophy). Knee exercises are designed to build up the tone and strength of the thigh muscles and to improve knee motion. Often times heat used for twenty to thirty minutes before working out will loosen up your tissues and help with improving the range of motion but do not use heat for the first two weeks following surgery. These exercises can be done on a training (exercise) mat, on the floor, on a table or on a bed. Use what ever works the best and is most comfortable for you Knee exercises include:  Leg Lifts - While your knee is still immobilized in a splint or cast, you can do straight leg raises. Lift the leg to 60 degrees, hold for 3 sec, and slowly lower the leg. Repeat 10-20 times 2-3 times daily. Perform this exercise against resistance later as your knee gets better.  Quad and Hamstring Sets - Tighten up the muscle on the front of the thigh (Quad) and hold for 5-10 sec. Repeat this 10-20 times hourly. Hamstring sets are done by pushing the foot backward against an object and holding for 5-10 sec. Repeat as with quad sets.  Leg Slides: Lying on your back, slowly slide your foot toward your buttocks, bending your knee up off the floor (only go as far  as is comfortable). Then slowly slide your foot back down until your leg is flat on the floor again. Angel Wings: Lying on your back spread your legs to the side as far apart as you can without causing discomfort.  A rehabilitation program following serious knee injuries can speed recovery and prevent re-injury in the future due to weakened muscles. Contact your doctor or a physical therapist for more information on knee rehabilitation.   IF YOU ARE TRANSFERRED TO A SKILLED REHAB FACILITY If the patient is transferred to a skilled rehab facility following release from the hospital, a list of the current medications will be sent to the facility for the patient to continue.  When discharged from the skilled rehab facility, please have the facility set up the patient's Home Health Physical Therapy prior to being released. Also, the skilled facility will be responsible for providing the patient with their medications at time of release from the facility to include their pain medication, the muscle relaxants, and their blood thinner medication. If the patient is still at the rehab facility at time of the two week follow up appointment, the skilled rehab facility will also need to assist the patient in arranging follow up appointment in our office and any transportation needs.  MAKE SURE YOU:  Understand these instructions.  Get help right away if you are not doing well or get worse.    Pick up stool softner and laxative for home use following surgery while on pain medications. May shower starting three days after surgery. Please use a clean towel to pat the leg dry following showers. Continue to use ice for pain  and swelling after surgery. Do not use any lotions or creams on the incision until instructed by you Start Aspirin immediately following surgery.   Do not put a pillow under the knee. Place it under the heel.   Complete by: As directed    Driving restrictions   Complete by: As directed    No  driving for two weeks   For home use only DME 4 wheeled rolling walker with seat   Complete by: As directed    Patient needs a walker to treat with the following condition: S/P total knee arthroplasty, left   Increase activity slowly as tolerated   Complete by: As directed    TED hose   Complete by: As directed    Use stockings (TED hose) for three weeks on both leg(s).  You may remove them at night for sleeping.   Weight bearing as tolerated   Complete by: As directed      Allergies as of 12/11/2019      Reactions   Almond Oil Anaphylaxis, Rash   ALL ALMONDS!   Nsaids Nausea Only   Adhesive [tape] Hives, Rash   Burns skin   Penicillins Rash   Did it involve swelling of the face/tongue/throat, SOB, or low BP? No Did it involve sudden or severe rash/hives, skin peeling, or any reaction on the inside of your mouth or nose? Yes Did you need to seek medical attention at a hospital or doctor's office? Was in the hospital when it happend When did it last happen?21 years ago If all above answers are "NO", may proceed with cephalosporin use.   Tolmetin Nausea Only   Vancomycin Rash      Medication List    TAKE these medications   candesartan-hydrochlorothiazide 16-12.5 MG tablet Commonly known as: ATACAND HCT Take 1 tablet by mouth every morning.   CHILDRENS MULTIVITAMIN/IRON PO Take 1 tablet by mouth daily. Flinstones   DEEP BLUE RELIEF EX Apply 1 application topically daily as needed (pain).   famotidine 40 MG tablet Commonly known as: PEPCID Take 40 mg by mouth at bedtime.   FLUoxetine 40 MG capsule Commonly known as: PROZAC Take 40 mg daily by mouth.   gabapentin 100 MG capsule Commonly known as: NEURONTIN Take 100-300 mg by mouth See admin instructions. Take 100 mg in the morning, 100 mg in the evening, and 300 mg at bedtime   linaclotide 145 MCG Caps capsule Commonly known as: LINZESS Take 145 mcg by mouth daily.   lubiprostone 24 MCG capsule Commonly  known as: Amitiza Take 1 capsule (24 mcg total) by mouth 2 (two) times daily with a meal.   methocarbamol 500 MG tablet Commonly known as: Robaxin Take 1 tablet (500 mg total) by mouth 4 (four) times daily.   morphine 15 MG 12 hr tablet Commonly known as: MS CONTIN Take 15 mg by mouth 3 (three) times daily.   Norco 5-325 MG tablet Generic drug: HYDROcodone-acetaminophen Take 1 tablet by mouth every 8 (eight) hours as needed for moderate pain.   ondansetron 4 MG tablet Commonly known as: Zofran Take 1 tablet (4 mg total) by mouth daily as needed for nausea or vomiting.   oxyCODONE 5 MG immediate release tablet Commonly known as: Roxicodone Take 1 tablet (5 mg total) by mouth every 4 (four) hours as needed for up to 7 days for severe pain.   Ozempic (0.25 or 0.5 MG/DOSE) 2 MG/1.5ML Sopn Generic drug: Semaglutide(0.25 or 0.5MG /DOS) Inject 0.5 mg as directed every Monday.  pantoprazole 40 MG tablet Commonly known as: PROTONIX Take 40 mg by mouth 2 (two) times daily.   rivaroxaban 10 MG Tabs tablet Commonly known as: Xarelto Take 1 tablet (10 mg total) by mouth daily. Need appointment   SUMAtriptan 100 MG tablet Commonly known as: IMITREX Take 1 tablet (100 mg total) by mouth every 2 (two) hours as needed for migraine.   topiramate 100 MG tablet Commonly known as: TOPAMAX Take 100 mg by mouth at bedtime.            Durable Medical Equipment  (From admission, onward)         Start     Ordered   12/11/19 1308  For home use only DME Walker rolling  Once    Question Answer Comment  Walker: With 5 Inch Wheels   Patient needs a walker to treat with the following condition Balance problem      12/11/19 1308   12/11/19 1000  For home use only DME Walker rolling  Once    Question Answer Comment  Walker: With 5 Inch Wheels   Patient needs a walker to treat with the following condition S/P knee replacement      12/11/19 1002   12/10/19 0000  For home use only DME 4  wheeled rolling walker with seat    Question:  Patient needs a walker to treat with the following condition  Answer:  S/P total knee arthroplasty, left   12/10/19 1016           Discharge Care Instructions  (From admission, onward)         Start     Ordered   12/09/19 0000  Weight bearing as tolerated     12/09/19 1352           Signed: Alfredo Martinez PA-C [facility] Office:  [phone]

## 2019-12-11 NOTE — Progress Notes (Signed)
Reviewed AVS.  Questions asked and answered.  Patient alert and oriented, skin warm and dry.  Discharged with family via wheelchair without concerns or complaints.

## 2019-12-11 NOTE — Progress Notes (Signed)
Subjective: 2 Days Post-Op Procedure(s) (LRB): TOTAL KNEE ARTHROPLASTY (Left)  Patient reports pain as moderate.  Patient has hx of L LE DVT.  Restarted Xarelto yesterday.  Reports that she was up working with therapy yesterday.  She is concerned about recurrent L LE DVT.  Denies fever, chills, N/V, CP, SOB.  Objective:   VITALS:  Temp:  [97.7 F (36.5 C)-99.1 F (37.3 C)] 97.7 F (36.5 C) (04/18 0627) Pulse Rate:  [71-81] 77 (04/18 0627) Resp:  [16-17] 16 (04/18 0627) BP: (102-125)/(56-59) 102/58 (04/18 0627) SpO2:  [97 %-100 %] 100 % (04/18 0627)  General: WDWN patient in NAD. Psych:  Appropriate mood and affect. Neuro:  A&O x 3, Moving all extremities, sensation intact to light touch HEENT:  EOMs intact Chest:  Even non-labored respirations Skin: Dressing D/I with scant dry blood on under surface of dressing, no rashes or lesions Extremities: warm/dry, mild edema to LE, erythema or echymosis.  No lymphadenopathy.  Tender to palpation L gastroc.  No palpable cord.  Positive homan's. Pulses: Popliteus 2+ MSK:  ROM: lacks 5 degrees TKE, MMT: able to perform quad set    LABS No results for input(s): HGB, WBC, PLT in the last 72 hours. No results for input(s): NA, K, CL, CO2, BUN, CREATININE, GLUCOSE in the last 72 hours. No results for input(s): LABPT, INR in the last 72 hours.   Assessment/Plan: 2 Days Post-Op Procedure(s) (LRB): TOTAL KNEE ARTHROPLASTY (Left)  Patient seen in rounds for Dr. Thomasena Edis  With patient's acute onset L LE calf pain and hx L LE blood clot I have placed an order for L LE doppler to R/O DVT.  Continue Xarelto  Up with therapy  WBAT L LE  If Doppler is negative patient may be able to go home later this evening.  Please inform me once results of doppler are available.  Dr. Thomasena Edis informed.  Alfredo Martinez PA-C [facility] Office:  [phone]

## 2019-12-11 NOTE — Progress Notes (Signed)
When I ambulated patient to bathroom/did assessment, patient complained of swelling in the calf of left leg. Patient expressed concerns of getting a blood clot. Called [facility] to inform about patient's situation and to ask if the ace wrap can come off of the left knee/leg. Dr. Duwayne Heck, MD returned call at 11:28 PM and stated to take off the ace wrap and put a pillow not under the knee, but towards the lower leg/ankle. Pillow applied to that area, but not under the knee. Ice applied. Pulses are palpable (2+) and skin is warm. Moderate drainage on the Aquacel dressing. Ecchymosis on left knee. Area where hemovac was: New bloody drainage reinforced with guaze and tape. Will continue to monitor the left lower extremity.

## 2019-12-11 NOTE — Plan of Care (Signed)
  Problem: Health Behavior/Discharge Planning: Goal: Ability to manage health-related needs will improve Outcome: Adequate for Discharge   Problem: Clinical Measurements: Goal: Ability to maintain clinical measurements within normal limits will improve Outcome: Adequate for Discharge Goal: Will remain free from infection Outcome: Adequate for Discharge Goal: Diagnostic test results will improve Outcome: Adequate for Discharge Goal: Respiratory complications will improve Outcome: Adequate for Discharge Goal: Cardiovascular complication will be avoided Outcome: Adequate for Discharge   Problem: Activity: Goal: Risk for activity intolerance will decrease Outcome: Adequate for Discharge   Problem: Nutrition: Goal: Adequate nutrition will be maintained Outcome: Adequate for Discharge   Problem: Coping: Goal: Level of anxiety will decrease Outcome: Adequate for Discharge   Problem: Elimination: Goal: Will not experience complications related to bowel motility Outcome: Adequate for Discharge Goal: Will not experience complications related to urinary retention Outcome: Adequate for Discharge   Problem: Pain Managment: Goal: General experience of comfort will improve Outcome: Adequate for Discharge   Problem: Safety: Goal: Ability to remain free from injury will improve Outcome: Adequate for Discharge   Problem: Skin Integrity: Goal: Risk for impaired skin integrity will decrease Outcome: Adequate for Discharge   Problem: Education: Goal: Knowledge of the prescribed therapeutic regimen will improve Outcome: Adequate for Discharge Goal: Individualized Educational Video(s) Outcome: Adequate for Discharge   Problem: Activity: Goal: Range of joint motion will improve Outcome: Adequate for Discharge   Problem: Clinical Measurements: Goal: Postoperative complications will be avoided or minimized Outcome: Adequate for Discharge   Problem: Pain Management: Goal: Pain level  will decrease with appropriate interventions Outcome: Adequate for Discharge   Problem: Skin Integrity: Goal: Will show signs of wound healing Outcome: Adequate for Discharge

## 2019-12-11 NOTE — Progress Notes (Signed)
Physical Therapy Treatment Patient Details Name: Ana Baker MRN: [id number] DOB: 05-Jul-1966 Today's Date: 12/11/2019    History of Present Illness Patient is 54 y.o. female s/p Lt TKA on 12/09/19 with PMH significant for spinal stenosis, osteopenia, OA, HTN, DM, obesity, GERD, anxiety, depression, anemia, prior DVT and PE, Rt TKA , spine surgery, gastric bypass, SBO and bowel resection.    PT Comments    Pt very cooperative and progressing well with mobility.  Pt hopeful for dc home later today.   Follow Up Recommendations  Follow surgeon's recommendation for DC plan and follow-up therapies;Outpatient PT     Equipment Recommendations  Rolling walker with 5" wheels    Recommendations for Other Services       Precautions / Restrictions Precautions Precautions: Fall Precaution Comments: instructed no pillow under knee Restrictions Weight Bearing Restrictions: No Other Position/Activity Restrictions: WBAT    Mobility  Bed Mobility Overal bed mobility: Needs Assistance Bed Mobility: Supine to Sit;Sit to Supine     Supine to sit: Supervision Sit to supine: Supervision   General bed mobility comments: increased time and no physical assist  Transfers Overall transfer level: Needs assistance Equipment used: Rolling walker (2 wheeled) Transfers: Sit to/from Stand Sit to Stand: Min guard;Supervision         General transfer comment: increased time and 25% VC's on proper hand placement as well as safety with turns.  Ambulation/Gait Ambulation/Gait assistance: Supervision;Min guard Gait Distance (Feet): 150 Feet Assistive device: Rolling walker (2 wheeled) Gait Pattern/deviations: Step-to pattern;Decreased step length - right;Decreased step length - left;Decreased stride length;Decreased stance time - left;Decreased weight shift to left;Antalgic Gait velocity: decreased   General Gait Details: 25% VC's on proper upright posture and safety with turns.  Also required  increased time.  Slow but steady.   Stairs             Wheelchair Mobility    Modified Rankin (Stroke Patients Only)       Balance Overall balance assessment: Needs assistance Sitting-balance support: Feet supported Sitting balance-Leahy Scale: Good     Standing balance support: During functional activity;Bilateral upper extremity supported Standing balance-Leahy Scale: Fair                              Cognition Arousal/Alertness: Awake/alert Behavior During Therapy: WFL for tasks assessed/performed Overall Cognitive Status: Within Functional Limits for tasks assessed                                        Exercises Total Joint Exercises Ankle Circles/Pumps: AROM;Both;20 reps;Supine Quad Sets: AROM;Left;15 reps;Supine Heel Slides: AROM;Left;20 reps;Supine Straight Leg Raises: AAROM;Left;20 reps;Supine Long Arc Quad: AAROM;AROM;Left;15 reps    General Comments        Pertinent Vitals/Pain Pain Assessment: 0-10 Pain Score: 5  Pain Location: Lt knee Pain Descriptors / Indicators: Aching;Discomfort;Moaning;Guarding;Grimacing Pain Intervention(s): Limited activity within patient's tolerance;Monitored during session;Premedicated before session;Ice applied    Home Living                      Prior Function            PT Goals (current goals can now be found in the care plan section) Acute Rehab PT Goals Patient Stated Goal: to return home and stop hurting PT Goal Formulation: With patient Time For Goal Achievement: 12/16/19  Potential to Achieve Goals: Good Progress towards PT goals: Progressing toward goals    Frequency    7X/week      PT Plan Current plan remains appropriate    Co-evaluation              AM-PAC PT "6 Clicks" Mobility   Outcome Measure  Help needed turning from your back to your side while in a flat bed without using bedrails?: A Little Help needed moving from lying on your back  to sitting on the side of a flat bed without using bedrails?: A Little Help needed moving to and from a bed to a chair (including a wheelchair)?: A Little Help needed standing up from a chair using your arms (e.g., wheelchair or bedside chair)?: A Little Help needed to walk in hospital room?: A Little Help needed climbing 3-5 steps with a railing? : A Little 6 Click Score: 18    End of Session Equipment Utilized During Treatment: Gait belt Activity Tolerance: Patient tolerated treatment well Patient left: in bed Nurse Communication: Mobility status PT Visit Diagnosis: Muscle weakness (generalized) (M62.81);Difficulty in walking, not elsewhere classified (R26.2);Pain Pain - Right/Left: Left Pain - part of body: Knee     Time: 0832-0902 PT Time Calculation (min) (ACUTE ONLY): 30 min  Charges:  $Gait Training: 8-22 mins $Therapeutic Exercise: 8-22 mins                     Debe Coder PT Acute Rehabilitation Services Pager [phone] Office [phone]    Ana Baker 12/11/2019, 12:18 PM

## 2019-12-11 NOTE — Progress Notes (Signed)
Lower venous duplex       has been completed. Preliminary results can be found under CV proc through chart review. Winthrop Shannahan, BS, RDMS, RVT   

## 2019-12-12 ENCOUNTER — Encounter: Payer: Self-pay | Admitting: *Deleted

## 2019-12-13 DIAGNOSIS — M25562 Pain in left knee: Secondary | ICD-10-CM | POA: Diagnosis not present

## 2019-12-16 DIAGNOSIS — M25562 Pain in left knee: Secondary | ICD-10-CM | POA: Diagnosis not present

## 2019-12-21 DIAGNOSIS — M25562 Pain in left knee: Secondary | ICD-10-CM | POA: Diagnosis not present

## 2019-12-23 DIAGNOSIS — M25562 Pain in left knee: Secondary | ICD-10-CM | POA: Diagnosis not present

## 2019-12-23 DIAGNOSIS — Z96652 Presence of left artificial knee joint: Secondary | ICD-10-CM | POA: Diagnosis not present

## 2019-12-23 DIAGNOSIS — Z471 Aftercare following joint replacement surgery: Secondary | ICD-10-CM | POA: Diagnosis not present

## 2019-12-26 DIAGNOSIS — M25562 Pain in left knee: Secondary | ICD-10-CM | POA: Diagnosis not present

## 2020-01-02 DIAGNOSIS — M25562 Pain in left knee: Secondary | ICD-10-CM | POA: Diagnosis not present

## 2020-01-05 DIAGNOSIS — M545 Low back pain: Secondary | ICD-10-CM | POA: Diagnosis not present

## 2020-01-05 DIAGNOSIS — G894 Chronic pain syndrome: Secondary | ICD-10-CM | POA: Diagnosis not present

## 2020-01-05 DIAGNOSIS — M533 Sacrococcygeal disorders, not elsewhere classified: Secondary | ICD-10-CM | POA: Diagnosis not present

## 2020-01-05 DIAGNOSIS — Z79891 Long term (current) use of opiate analgesic: Secondary | ICD-10-CM | POA: Diagnosis not present

## 2020-01-05 DIAGNOSIS — M25562 Pain in left knee: Secondary | ICD-10-CM | POA: Diagnosis not present

## 2020-01-10 DIAGNOSIS — M25562 Pain in left knee: Secondary | ICD-10-CM | POA: Diagnosis not present

## 2020-01-13 DIAGNOSIS — M25562 Pain in left knee: Secondary | ICD-10-CM | POA: Diagnosis not present

## 2020-01-16 DIAGNOSIS — M25562 Pain in left knee: Secondary | ICD-10-CM | POA: Diagnosis not present

## 2020-01-18 DIAGNOSIS — M25562 Pain in left knee: Secondary | ICD-10-CM | POA: Diagnosis not present

## 2020-01-24 DIAGNOSIS — M25562 Pain in left knee: Secondary | ICD-10-CM | POA: Diagnosis not present

## 2020-01-30 DIAGNOSIS — M25562 Pain in left knee: Secondary | ICD-10-CM | POA: Diagnosis not present

## 2020-02-01 DIAGNOSIS — M25562 Pain in left knee: Secondary | ICD-10-CM | POA: Diagnosis not present

## 2020-02-06 DIAGNOSIS — M25562 Pain in left knee: Secondary | ICD-10-CM | POA: Diagnosis not present

## 2020-02-14 DIAGNOSIS — M25562 Pain in left knee: Secondary | ICD-10-CM | POA: Diagnosis not present

## 2020-02-14 DIAGNOSIS — Z4789 Encounter for other orthopedic aftercare: Secondary | ICD-10-CM | POA: Diagnosis not present

## 2020-02-16 DIAGNOSIS — M25562 Pain in left knee: Secondary | ICD-10-CM | POA: Diagnosis not present

## 2020-02-23 DIAGNOSIS — Z471 Aftercare following joint replacement surgery: Secondary | ICD-10-CM | POA: Diagnosis not present

## 2020-02-23 DIAGNOSIS — M25562 Pain in left knee: Secondary | ICD-10-CM | POA: Diagnosis not present

## 2020-02-23 DIAGNOSIS — Z96652 Presence of left artificial knee joint: Secondary | ICD-10-CM | POA: Diagnosis not present

## 2020-02-28 DIAGNOSIS — M25562 Pain in left knee: Secondary | ICD-10-CM | POA: Diagnosis not present

## 2020-03-05 DIAGNOSIS — M25562 Pain in left knee: Secondary | ICD-10-CM | POA: Diagnosis not present

## 2020-03-13 DIAGNOSIS — Z79899 Other long term (current) drug therapy: Secondary | ICD-10-CM | POA: Diagnosis not present

## 2020-03-16 DIAGNOSIS — M25562 Pain in left knee: Secondary | ICD-10-CM | POA: Diagnosis not present

## 2020-03-20 DIAGNOSIS — M25562 Pain in left knee: Secondary | ICD-10-CM | POA: Diagnosis not present

## 2020-03-22 DIAGNOSIS — M25562 Pain in left knee: Secondary | ICD-10-CM | POA: Diagnosis not present

## 2020-03-23 DIAGNOSIS — Z808 Family history of malignant neoplasm of other organs or systems: Secondary | ICD-10-CM | POA: Diagnosis not present

## 2020-03-23 DIAGNOSIS — Z8049 Family history of malignant neoplasm of other genital organs: Secondary | ICD-10-CM | POA: Diagnosis not present

## 2020-03-23 DIAGNOSIS — Z800 Family history of malignant neoplasm of digestive organs: Secondary | ICD-10-CM | POA: Diagnosis not present

## 2020-03-23 DIAGNOSIS — Z7901 Long term (current) use of anticoagulants: Secondary | ICD-10-CM | POA: Diagnosis not present

## 2020-03-23 DIAGNOSIS — Z9884 Bariatric surgery status: Secondary | ICD-10-CM | POA: Diagnosis not present

## 2020-03-23 DIAGNOSIS — Z86718 Personal history of other venous thrombosis and embolism: Secondary | ICD-10-CM | POA: Diagnosis not present

## 2020-03-23 DIAGNOSIS — D509 Iron deficiency anemia, unspecified: Secondary | ICD-10-CM | POA: Diagnosis not present

## 2020-03-23 DIAGNOSIS — Z806 Family history of leukemia: Secondary | ICD-10-CM | POA: Diagnosis not present

## 2020-03-23 DIAGNOSIS — Z803 Family history of malignant neoplasm of breast: Secondary | ICD-10-CM | POA: Diagnosis not present

## 2020-03-23 DIAGNOSIS — Z801 Family history of malignant neoplasm of trachea, bronchus and lung: Secondary | ICD-10-CM | POA: Diagnosis not present

## 2020-03-29 DIAGNOSIS — M25562 Pain in left knee: Secondary | ICD-10-CM | POA: Diagnosis not present

## 2020-04-03 DIAGNOSIS — M25562 Pain in left knee: Secondary | ICD-10-CM | POA: Diagnosis not present

## 2020-04-05 DIAGNOSIS — M25562 Pain in left knee: Secondary | ICD-10-CM | POA: Diagnosis not present

## 2020-04-10 DIAGNOSIS — M25562 Pain in left knee: Secondary | ICD-10-CM | POA: Diagnosis not present

## 2020-05-11 ENCOUNTER — Other Ambulatory Visit: Payer: Self-pay | Admitting: Internal Medicine

## 2020-05-11 ENCOUNTER — Other Ambulatory Visit: Payer: Self-pay | Admitting: *Deleted

## 2020-05-11 MED ORDER — RIVAROXABAN 10 MG PO TABS: 10.0000 mg | ORAL_TABLET | Freq: Every day | ORAL | 1 refills | Status: DC

## 2020-05-11 NOTE — Telephone Encounter (Signed)
duplicate

## 2020-05-11 NOTE — Telephone Encounter (Signed)
Patient overdue for appointment, has not been seen in over a year

## 2020-05-11 NOTE — Telephone Encounter (Signed)
Prescription refill request for Xarelto received.   Last office visit: 04/22/2019, [place], per note Xarelto 10mg  daily  Weight: 113.9 kg Age: 54 y.o. Scr: 0.83, 12/01/2019 CrCl: 140 ml/min   Pt overdue for an office visit called pt and pt scheduled to have an appointment on 11/9 with Dr. 13/9.   Prescription refill sent.

## 2020-05-11 NOTE — Telephone Encounter (Signed)
Please review. KW 

## 2020-05-18 DIAGNOSIS — I10 Essential (primary) hypertension: Secondary | ICD-10-CM | POA: Diagnosis not present

## 2020-05-18 DIAGNOSIS — E559 Vitamin D deficiency, unspecified: Secondary | ICD-10-CM | POA: Diagnosis not present

## 2020-05-18 DIAGNOSIS — R5383 Other fatigue: Secondary | ICD-10-CM | POA: Diagnosis not present

## 2020-05-18 DIAGNOSIS — E119 Type 2 diabetes mellitus without complications: Secondary | ICD-10-CM | POA: Diagnosis not present

## 2020-05-21 DIAGNOSIS — F329 Major depressive disorder, single episode, unspecified: Secondary | ICD-10-CM | POA: Diagnosis not present

## 2020-05-21 DIAGNOSIS — Z23 Encounter for immunization: Secondary | ICD-10-CM | POA: Diagnosis not present

## 2020-05-21 DIAGNOSIS — I10 Essential (primary) hypertension: Secondary | ICD-10-CM | POA: Diagnosis not present

## 2020-05-21 DIAGNOSIS — G43909 Migraine, unspecified, not intractable, without status migrainosus: Secondary | ICD-10-CM | POA: Diagnosis not present

## 2020-05-21 DIAGNOSIS — K219 Gastro-esophageal reflux disease without esophagitis: Secondary | ICD-10-CM | POA: Diagnosis not present

## 2020-05-30 DIAGNOSIS — Z4789 Encounter for other orthopedic aftercare: Secondary | ICD-10-CM | POA: Diagnosis not present

## 2020-06-12 DIAGNOSIS — M810 Age-related osteoporosis without current pathological fracture: Secondary | ICD-10-CM | POA: Diagnosis not present

## 2020-06-12 DIAGNOSIS — R7989 Other specified abnormal findings of blood chemistry: Secondary | ICD-10-CM | POA: Diagnosis not present

## 2020-06-12 DIAGNOSIS — M5416 Radiculopathy, lumbar region: Secondary | ICD-10-CM | POA: Diagnosis not present

## 2020-06-12 DIAGNOSIS — G894 Chronic pain syndrome: Secondary | ICD-10-CM | POA: Diagnosis not present

## 2020-06-27 DIAGNOSIS — Z20822 Contact with and (suspected) exposure to covid-19: Secondary | ICD-10-CM | POA: Diagnosis not present

## 2020-06-29 DIAGNOSIS — Z9884 Bariatric surgery status: Secondary | ICD-10-CM | POA: Diagnosis not present

## 2020-06-29 DIAGNOSIS — D509 Iron deficiency anemia, unspecified: Secondary | ICD-10-CM | POA: Diagnosis not present

## 2020-06-29 DIAGNOSIS — M5459 Other low back pain: Secondary | ICD-10-CM | POA: Diagnosis not present

## 2020-06-29 DIAGNOSIS — M545 Low back pain, unspecified: Secondary | ICD-10-CM | POA: Diagnosis not present

## 2020-06-29 DIAGNOSIS — M810 Age-related osteoporosis without current pathological fracture: Secondary | ICD-10-CM | POA: Diagnosis not present

## 2020-06-29 DIAGNOSIS — M5136 Other intervertebral disc degeneration, lumbar region: Secondary | ICD-10-CM | POA: Diagnosis not present

## 2020-07-03 ENCOUNTER — Encounter: Payer: Self-pay | Admitting: Internal Medicine

## 2020-07-03 ENCOUNTER — Ambulatory Visit (INDEPENDENT_AMBULATORY_CARE_PROVIDER_SITE_OTHER): Payer: BC Managed Care – PPO | Admitting: Internal Medicine

## 2020-07-03 VITALS — BP 108/74 | HR 58 | Ht 68.0 in | Wt 259.0 lb

## 2020-07-03 DIAGNOSIS — I10 Essential (primary) hypertension: Secondary | ICD-10-CM

## 2020-07-03 DIAGNOSIS — I700 Atherosclerosis of aorta: Secondary | ICD-10-CM

## 2020-07-03 DIAGNOSIS — Z86718 Personal history of other venous thrombosis and embolism: Secondary | ICD-10-CM

## 2020-07-03 MED ORDER — RIVAROXABAN 10 MG PO TABS: 10.0000 mg | ORAL_TABLET | Freq: Every day | ORAL | 3 refills | Status: DC

## 2020-07-03 NOTE — Progress Notes (Signed)
OFFICE NOTE  Chief Complaint:  Follow-up DVT  Primary Care Physician: Birdena Jubilee, MD (Inactive)  HPI:  Ana Baker is a pleasant 54 year old female presenting followed by Dr. Alanda Amass. Unfortunately she has a history of morbid obesity and is status post gastric bypass surgery. She's had a number of both knee surgeries and lumbar surgeries with a history of lumbar stenosis. In the past she developed a DVT after her first surgery in 1999 and then had pulmonary emboli. She was treated on warfarin for this but then had problems with kidney stones and then developed significant bleeding. After surgery in 2004 she had an IVC filter placed and was removed. She also had recurrent DVT in 2011. She was then switched from warfarin over 2 Xarelto. So far Xarelto has been working well for her and a prior discussion with Dr. Alanda Amass indicated that she may be able to come off of this, despite the fact that she's had 2 episodes of DVT and pulmonary emboli in the past. Today she wishes to discuss management of her Xarelto in the setting of possible spinal injections. She continues to have problems with low back pain and numbness and tingling in her right leg. There is a recommendation for injection however she will need to be off of Xarelto for this. Other than low back pain she has no other complaints.  Ana Baker returns today for followup. Overall she has no complaints. She reports that her primary care provider is going to leave of absence and moving out of the [facility] health system. She is interested in getting laboratory work today and enrolling in a health and wellness program through her work, they are requiring laboratory work within the past year. The last laboratory work that I have is from September of last year therefore she is due for a recheck.  I saw Ana Baker today back for an annual follow-up. Generally she is doing well with regards to anticoagulation. She's had no recurrent DVT or PE.  She is tolerating Xarelto without any bleeding difficulty. She continues to see Dr. Ethelene Hal for back pain. Unfortunately there are few options for her. She is ready had surgery and now is on long-acting morphine and Vicodin for pain control.  11/24/2016  Ana Baker returns today for follow-up of her DVT. She's not had any recurrence and remains on Xarelto. She had had previous testing for clotting disorders in the past without a real identification of the etiology of her recurrent thrombosis. She says there is a family history of clot formation. She is wondering if she could ever come off of her anticoagulation.  07/03/2020  Ana Baker is seen today in follow-up.  I last saw her via telemedicine visit in 2020.  More recently she had surgery.  She seems to be doing well from that.  Her Xarelto dose has been decreased by myself down to 10 mg for maintenance.  She had no issues of clots or any thing related to her surgery.  Blood pressure is well controlled.  She did have a recent CT scan that showed some aortic atherosclerosis.  Her PCP follows her cholesterol but she is not on any treatment for that.  Continues to work on weight loss.  PMHx:  Past Medical History:  Diagnosis Date  . Anemia   . Anxiety   . DDD (degenerative disc disease), lumbar   . DDD (degenerative disc disease), lumbar   . Depression   . Diabetes mellitus without complication (HCC)   .  DVT (deep venous thrombosis) (HCC) 04/2010   and PE, in post-operative setting   . Endometriosis   . GERD (gastroesophageal reflux disease)   . History of kidney stones   . History of pulmonary embolism   . Hypertension   . Idiopathic parathyroidism (HCC)   . Migraines   . OA (osteoarthritis)   . Obesity   . Osteopenia   . Pneumonia    x2  . PONV (postoperative nausea and vomiting)   . Spinal stenosis   . Venous reflux    left leg  . Vitamin D deficiency     Past Surgical History:  Procedure Laterality Date  . ABDOMINAL  HYSTERECTOMY  2008  . BOWEL RESECTION  03/22/04   sbo  . CHOLECYSTECTOMY OPEN  1990  . COLONOSCOPY W/ POLYPECTOMY    . GASTRIC BYPASS  07/31/2003   DVT post-op - IV filter  . HERNIA REPAIR  08/2004   Internal hernia repair  . JOINT REPLACEMENT    . KIDNEY STONE SURGERY  98,00  . KNEE ARTHROSCOPY Left 06,07  . KNEE ARTHROSCOPY Left 12/2009   Dr. Thomasena Edis  . LIPOMA EXCISION  03/12/04   right arm  . NM MYOCAR PERF WALL MOTION  04/2010   dipyridamole; normal pattern of perfusion to all regions; EF 68%; low risk study   . SALPINGOOPHORECTOMY Left   . SPINE SURGERY  2009  . TOTAL KNEE ARTHROPLASTY Right 82,98,99,00   DVT & PE post-op 1999  . TOTAL KNEE ARTHROPLASTY Left 12/09/2019   Procedure: TOTAL KNEE ARTHROPLASTY;  Surgeon: Eugenia Mcalpine, MD;  Location: [facility];  Service: Orthopedics;  Laterality: Left;  adductor canal  . TRANSTHORACIC ECHOCARDIOGRAM  03/2012   EF=>55%, trace MR & TR, normal RSVP    FAMHx:  Family History  Problem Relation Age of Onset  . Heart failure Father   . Heart disease Father   . Diabetes Mother   . Hypertension Mother   . Heart disease Mother        CHF  . Hypertension Sister   . Colon cancer Maternal Aunt   . Lung cancer Maternal Uncle   . Lung cancer Paternal Uncle   . Lymphoma Maternal Grandfather   . Heart failure Maternal Grandmother   . Diabetes Paternal Grandfather   . Heart Problems Paternal Grandfather   . Alzheimer's disease Paternal Grandmother     SOCHx:   reports that she has never smoked. She has never used smokeless tobacco. She reports previous alcohol use. She reports that she does not use drugs.  ALLERGIES:  Allergies  Allergen Reactions  . Almond Oil Anaphylaxis and Rash    ALL ALMONDS!  . Nsaids Nausea Only  . Adhesive [Tape] Hives and Rash    Burns skin  . Penicillins Rash    Did it involve swelling of the face/tongue/throat, SOB, or low BP? No Did it involve sudden or severe rash/hives, skin peeling, or any reaction  on the inside of your mouth or nose? Yes Did you need to seek medical attention at a hospital or doctor's office? Was in the hospital when it happend When did it last happen?21 years ago If all above answers are "NO", may proceed with cephalosporin use.   . Tolmetin Nausea Only  . Vancomycin Rash    ROS: Pertinent items noted in HPI and remainder of comprehensive ROS otherwise negative.  HOME MEDS: Current Outpatient Medications  Medication Sig Dispense Refill  . candesartan-hydrochlorothiazide (ATACAND HCT) 16-12.5 MG tablet Take 1 tablet  by mouth every morning.    . famotidine (PEPCID) 40 MG tablet Take 40 mg by mouth at bedtime.   3  . FLUoxetine (PROZAC) 40 MG capsule Take 40 mg daily by mouth.    . gabapentin (NEURONTIN) 100 MG capsule Take 100-300 mg by mouth See admin instructions. Take 100 mg in the morning, 100 mg in the evening, and 300 mg at bedtime    . HYDROcodone-acetaminophen (NORCO) 5-325 MG per tablet Take 1 tablet by mouth every 8 (eight) hours as needed for moderate pain.     Marland Kitchen. linaclotide (LINZESS) 145 MCG CAPS capsule Take 145 mcg by mouth daily.    . Liniments (DEEP BLUE RELIEF EX) Apply 1 application topically daily as needed (pain).    . lubiprostone (AMITIZA) 24 MCG capsule Take 1 capsule (24 mcg total) by mouth 2 (two) times daily with a meal. 60 capsule 11  . methocarbamol (ROBAXIN) 500 MG tablet Take 1 tablet (500 mg total) by mouth 4 (four) times daily. 40 tablet 0  . morphine (MS CONTIN) 15 MG 12 hr tablet Take 15 mg by mouth 3 (three) times daily.     . ondansetron (ZOFRAN) 4 MG tablet Take 1 tablet (4 mg total) by mouth daily as needed for nausea or vomiting. 30 tablet 1  . pantoprazole (PROTONIX) 40 MG tablet Take 40 mg by mouth 2 (two) times daily.     . Pediatric Multivitamins-Iron (CHILDRENS MULTIVITAMIN/IRON PO) Take 1 tablet by mouth daily. Flinstones    . rivaroxaban (XARELTO) 10 MG TABS tablet Take 1 tablet (10 mg total) by mouth daily. Need  appointment 30 tablet 1  . Semaglutide,0.25 or 0.5MG /DOS, (OZEMPIC, 0.25 OR 0.5 MG/DOSE,) 2 MG/1.5ML SOPN Inject 0.5 mg as directed every Monday.     . SUMAtriptan (IMITREX) 100 MG tablet Take 1 tablet (100 mg total) by mouth every 2 (two) hours as needed for migraine. 27 tablet 3  . topiramate (TOPAMAX) 100 MG tablet Take 100 mg by mouth at bedtime.     No current facility-administered medications for this visit.    LABS/IMAGING: No results found for this or any previous visit (from the past 48 hour(s)). No results found.  VITALS: BP 108/74   Pulse (!) 58   Ht 5\' 8"  (1.727 m)   Wt 259 lb (117.5 kg)   SpO2 99%   BMI 39.38 kg/m   EXAM: General appearance: alert and no distress Neck: no carotid bruit and no JVD Lungs: clear to auscultation bilaterally Heart: regular rate and rhythm, S1, S2 normal, no murmur, click, rub or gallop Abdomen: soft, non-tender; bowel sounds normal; no masses,  no organomegaly Extremities: extremities normal, atraumatic, no cyanosis or edema Pulses: 2+ and symmetric Skin: Skin color, texture, turgor normal. No rashes or lesions Neurologic: Grossly normal Psych: Pleasant, mildly anxious  EKG: Sinus bradycardia first-degree AV block at 58-personally reviewed  ASSESSMENT: 1. Recurrent DVT/PE on Xarelto 2. Spinal stenosis 3. Low risk for spinal injections 4. Obesity status post bariatric surgery 5. Hypertension  PLAN: 1.   Mrs. Antonieta PertDykes has done well on low-dose Xarelto.  Will continue that.  Blood pressure is well controlled.  She continues to work on weight loss.  She recently was put on disability by orthopedics but says that she will continue to try to be active at home.  Plan follow-up with me annually or sooner as necessary.  Chrystie NoseKenneth C. Cornella Emmer, MD, [facility] Weill Cornell CenterFACC, FACP  [place]  [facility]  Medical Director of the [facility]  [facility]  [facility] Diplomate of the American Board of Clinical Lipidology Attending  Cardiologist  Direct Dial: [phone]  Fax: [phone]  Website:  [email or web address] 07/03/2020, 10:51 AM

## 2020-07-03 NOTE — Patient Instructions (Signed)

## 2020-07-24 DIAGNOSIS — M810 Age-related osteoporosis without current pathological fracture: Secondary | ICD-10-CM | POA: Diagnosis not present

## 2020-07-24 DIAGNOSIS — Z780 Asymptomatic menopausal state: Secondary | ICD-10-CM | POA: Diagnosis not present

## 2020-07-24 DIAGNOSIS — M5416 Radiculopathy, lumbar region: Secondary | ICD-10-CM | POA: Diagnosis not present

## 2020-07-24 DIAGNOSIS — E2839 Other primary ovarian failure: Secondary | ICD-10-CM | POA: Diagnosis not present

## 2020-07-30 DIAGNOSIS — E119 Type 2 diabetes mellitus without complications: Secondary | ICD-10-CM | POA: Diagnosis not present

## 2020-07-30 DIAGNOSIS — K219 Gastro-esophageal reflux disease without esophagitis: Secondary | ICD-10-CM | POA: Diagnosis not present

## 2020-07-30 DIAGNOSIS — R82994 Hypercalciuria: Secondary | ICD-10-CM | POA: Diagnosis not present

## 2020-07-30 DIAGNOSIS — M810 Age-related osteoporosis without current pathological fracture: Secondary | ICD-10-CM | POA: Diagnosis not present

## 2020-07-30 DIAGNOSIS — N200 Calculus of kidney: Secondary | ICD-10-CM | POA: Diagnosis not present

## 2020-07-30 DIAGNOSIS — E559 Vitamin D deficiency, unspecified: Secondary | ICD-10-CM | POA: Diagnosis not present

## 2020-07-30 DIAGNOSIS — I10 Essential (primary) hypertension: Secondary | ICD-10-CM | POA: Diagnosis not present

## 2020-07-31 DIAGNOSIS — M5136 Other intervertebral disc degeneration, lumbar region: Secondary | ICD-10-CM | POA: Diagnosis not present

## 2020-08-16 DIAGNOSIS — Z1231 Encounter for screening mammogram for malignant neoplasm of breast: Secondary | ICD-10-CM | POA: Diagnosis not present

## 2020-08-21 DIAGNOSIS — Z23 Encounter for immunization: Secondary | ICD-10-CM | POA: Diagnosis not present

## 2020-08-22 DIAGNOSIS — M545 Low back pain, unspecified: Secondary | ICD-10-CM | POA: Diagnosis not present

## 2020-08-28 DIAGNOSIS — M810 Age-related osteoporosis without current pathological fracture: Secondary | ICD-10-CM | POA: Diagnosis not present

## 2020-08-28 DIAGNOSIS — M545 Low back pain, unspecified: Secondary | ICD-10-CM | POA: Diagnosis not present

## 2020-08-28 DIAGNOSIS — D6869 Other thrombophilia: Secondary | ICD-10-CM | POA: Diagnosis not present

## 2020-09-08 DIAGNOSIS — E669 Obesity, unspecified: Secondary | ICD-10-CM | POA: Insufficient documentation

## 2020-09-08 DIAGNOSIS — R21 Rash and other nonspecific skin eruption: Secondary | ICD-10-CM | POA: Diagnosis not present

## 2020-10-08 DIAGNOSIS — D6869 Other thrombophilia: Secondary | ICD-10-CM | POA: Diagnosis not present

## 2020-10-08 DIAGNOSIS — Z79891 Long term (current) use of opiate analgesic: Secondary | ICD-10-CM | POA: Diagnosis not present

## 2020-10-08 DIAGNOSIS — G894 Chronic pain syndrome: Secondary | ICD-10-CM | POA: Diagnosis not present

## 2020-10-08 DIAGNOSIS — M5136 Other intervertebral disc degeneration, lumbar region: Secondary | ICD-10-CM | POA: Diagnosis not present

## 2020-10-08 DIAGNOSIS — M7542 Impingement syndrome of left shoulder: Secondary | ICD-10-CM | POA: Diagnosis not present

## 2020-12-31 DIAGNOSIS — E119 Type 2 diabetes mellitus without complications: Secondary | ICD-10-CM | POA: Diagnosis not present

## 2020-12-31 DIAGNOSIS — N2581 Secondary hyperparathyroidism of renal origin: Secondary | ICD-10-CM | POA: Diagnosis not present

## 2020-12-31 DIAGNOSIS — K219 Gastro-esophageal reflux disease without esophagitis: Secondary | ICD-10-CM | POA: Diagnosis not present

## 2020-12-31 DIAGNOSIS — M810 Age-related osteoporosis without current pathological fracture: Secondary | ICD-10-CM | POA: Diagnosis not present

## 2021-01-02 DIAGNOSIS — Z7689 Persons encountering health services in other specified circumstances: Secondary | ICD-10-CM | POA: Diagnosis not present

## 2021-01-02 DIAGNOSIS — E119 Type 2 diabetes mellitus without complications: Secondary | ICD-10-CM | POA: Diagnosis not present

## 2021-02-06 DIAGNOSIS — H40023 Open angle with borderline findings, high risk, bilateral: Secondary | ICD-10-CM | POA: Diagnosis not present

## 2021-02-11 DIAGNOSIS — G894 Chronic pain syndrome: Secondary | ICD-10-CM | POA: Diagnosis not present

## 2021-02-11 DIAGNOSIS — Z79899 Other long term (current) drug therapy: Secondary | ICD-10-CM | POA: Diagnosis not present

## 2021-02-11 DIAGNOSIS — Z5181 Encounter for therapeutic drug level monitoring: Secondary | ICD-10-CM | POA: Diagnosis not present

## 2021-02-15 DIAGNOSIS — M25512 Pain in left shoulder: Secondary | ICD-10-CM | POA: Diagnosis not present

## 2021-02-15 DIAGNOSIS — Z96652 Presence of left artificial knee joint: Secondary | ICD-10-CM | POA: Diagnosis not present

## 2021-03-18 ENCOUNTER — Emergency Department (HOSPITAL_BASED_OUTPATIENT_CLINIC_OR_DEPARTMENT_OTHER)
Admission: EM | Admit: 2021-03-18 | Discharge: 2021-03-18 | Disposition: A | Discharge status: Home / Self Care | Payer: BC Managed Care – PPO | Attending: Emergency Medicine | Admitting: Emergency Medicine

## 2021-03-18 ENCOUNTER — Other Ambulatory Visit: Payer: Self-pay

## 2021-03-18 ENCOUNTER — Encounter (HOSPITAL_BASED_OUTPATIENT_CLINIC_OR_DEPARTMENT_OTHER): Payer: Self-pay | Admitting: *Deleted

## 2021-03-18 ENCOUNTER — Telehealth: Payer: Self-pay | Admitting: Internal Medicine

## 2021-03-18 ENCOUNTER — Emergency Department (HOSPITAL_BASED_OUTPATIENT_CLINIC_OR_DEPARTMENT_OTHER): Payer: BC Managed Care – PPO

## 2021-03-18 DIAGNOSIS — E119 Type 2 diabetes mellitus without complications: Secondary | ICD-10-CM | POA: Diagnosis not present

## 2021-03-18 DIAGNOSIS — Z79899 Other long term (current) drug therapy: Secondary | ICD-10-CM | POA: Diagnosis not present

## 2021-03-18 DIAGNOSIS — R0789 Other chest pain: Secondary | ICD-10-CM | POA: Insufficient documentation

## 2021-03-18 DIAGNOSIS — R079 Chest pain, unspecified: Secondary | ICD-10-CM | POA: Diagnosis not present

## 2021-03-18 DIAGNOSIS — R110 Nausea: Secondary | ICD-10-CM | POA: Insufficient documentation

## 2021-03-18 DIAGNOSIS — Z7901 Long term (current) use of anticoagulants: Secondary | ICD-10-CM | POA: Diagnosis not present

## 2021-03-18 DIAGNOSIS — Z96653 Presence of artificial knee joint, bilateral: Secondary | ICD-10-CM | POA: Insufficient documentation

## 2021-03-18 DIAGNOSIS — I10 Essential (primary) hypertension: Secondary | ICD-10-CM | POA: Diagnosis not present

## 2021-03-18 HISTORY — DX: Age-related osteoporosis without current pathological fracture: M81.0

## 2021-03-18 LAB — CBC
HCT: 39.6 % (ref 36.0–46.0)
Hemoglobin: 13.6 g/dL (ref 12.0–15.0)
MCH: 32.3 pg (ref 26.0–34.0)
MCHC: 34.3 g/dL (ref 30.0–36.0)
MCV: 94.1 fL (ref 80.0–100.0)
Platelets: 212 10*3/uL (ref 150–400)
RBC: 4.21 MIL/uL (ref 3.87–5.11)
RDW: 13.8 % (ref 11.5–15.5)
WBC: 6.1 10*3/uL (ref 4.0–10.5)
nRBC: 0 % (ref 0.0–0.2)

## 2021-03-18 LAB — D-DIMER, QUANTITATIVE: D-Dimer, Quant: 0.28 ug/mL-FEU (ref 0.00–0.50)

## 2021-03-18 LAB — BASIC METABOLIC PANEL
Anion gap: 9 (ref 5–15)
BUN: 12 mg/dL (ref 6–20)
CO2: 24 mmol/L (ref 22–32)
Calcium: 8.8 mg/dL — ABNORMAL LOW (ref 8.9–10.3)
Chloride: 107 mmol/L (ref 98–111)
Creatinine, Ser: 0.77 mg/dL (ref 0.44–1.00)
GFR, Estimated: >60 mL/min (ref >60)
Glucose, Bld: 80 mg/dL (ref 70–99)
Potassium: 4.1 mmol/L (ref 3.5–5.1)
Sodium: 140 mmol/L (ref 135–145)

## 2021-03-18 LAB — HEPATIC FUNCTION PANEL
ALT: 19 U/L (ref 0–44)
AST: 22 U/L (ref 15–41)
Albumin: 3.9 g/dL (ref 3.5–5.0)
Alkaline Phosphatase: 43 U/L (ref 38–126)
Bilirubin, Direct: 0.1 mg/dL (ref 0.0–0.2)
Indirect Bilirubin: 0.3 mg/dL (ref 0.3–0.9)
Total Bilirubin: 0.4 mg/dL (ref 0.3–1.2)
Total Protein: 6.5 g/dL (ref 6.5–8.1)

## 2021-03-18 LAB — PREGNANCY, URINE: Preg Test, Ur: NEGATIVE

## 2021-03-18 LAB — TROPONIN I (HIGH SENSITIVITY): Troponin I (High Sensitivity): 2 ng/L (ref <18)

## 2021-03-18 LAB — LIPASE, BLOOD: Lipase: 23 U/L (ref 11–51)

## 2021-03-18 NOTE — Telephone Encounter (Signed)
Pt c/o of Chest Pain: STAT if CP now or developed within 24 hours  1. Are you having CP right now? yes  2. Are you experiencing any other symptoms (ex. SOB, nausea, vomiting, sweating)?  Nausea, but not at this time  3. How long have you been experiencing CP?  About a week  4. Is your CP continuous or coming and going?  staying 5. Have you taken Nitroglycerin?  Do not have any ?

## 2021-03-18 NOTE — ED Provider Notes (Signed)
[facility] Provider Note   CSN: [id number] Arrival date & time: 03/18/21  1312     History Chief Complaint  Patient presents with   Chest Pain    Duncan DullCheryl A Mandile is a 55 y.o. female.  55 year old female with past medical history below including PE/DVT on Xarelto, anxiety/depression, type 2 diabetes mellitus, kidney stones, hypertension, obesity who presents with chest pain.  Patient reports 1 week of relatively constant central to right sided chest tightness/pressure that seems worse with exertion and activity.  Pain is nonradiating.  She has mild associated shortness of breath or "windedness" and some nausea.  She denies any vomiting or abdominal pain.  No associated new leg swelling/pain although she has chronic bilateral leg pain and occasionally has lower extremity edema when she has been standing on her feet for a while.  No recent long travel.  She reports strict compliance with anticoagulation, never misses doses.  She denies any cough/cold symptoms or recent illness.  No personal history of heart disease.  Family history with multiple relatives with blood clots; father and grandparent with congestive heart failure.  The history is provided by the patient.  Chest Pain     Past Medical History:  Diagnosis Date   Anemia    Anxiety    DDD (degenerative disc disease), lumbar    DDD (degenerative disc disease), lumbar    Depression    Diabetes mellitus without complication (HCC)    DVT (deep venous thrombosis) (HCC) 04/2010   and PE, in post-operative setting    Endometriosis    GERD (gastroesophageal reflux disease)    History of kidney stones    History of pulmonary embolism    Hypertension    Idiopathic parathyroidism (HCC)    Migraines    OA (osteoarthritis)    Obesity    Osteopenia    Osteoporosis    Pneumonia    x2   PONV (postoperative nausea and vomiting)    Spinal stenosis    Venous reflux    left leg   Vitamin D deficiency      Patient Active Problem List   Diagnosis Date Noted   Osteoarthritis of left knee 12/09/2019   DVT, lower extremity (HCC) 10/07/2013   Hx pulmonary embolism 10/07/2013   Spinal stenosis at L4-L5 level 10/07/2013   Obesity (BMI 35.0-39.9 without comorbidity) 10/07/2013   Chronic anticoagulation 10/07/2013   Abdominal pain, acute 03/21/2011    Past Surgical History:  Procedure Laterality Date   ABDOMINAL HYSTERECTOMY  2008   BOWEL RESECTION  03/22/04   sbo   CHOLECYSTECTOMY OPEN  1990   COLONOSCOPY W/ POLYPECTOMY     GASTRIC BYPASS  07/31/2003   DVT post-op - IV filter   HERNIA REPAIR  08/2004   Internal hernia repair   JOINT REPLACEMENT     KIDNEY STONE SURGERY  98,00   KNEE ARTHROSCOPY Left 06,07   KNEE ARTHROSCOPY Left 12/2009   Dr. Thomasena Edisollins   LIPOMA EXCISION  03/12/04   right arm   NM MYOCAR PERF WALL MOTION  04/2010   dipyridamole; normal pattern of perfusion to all regions; EF 68%; low risk study    SALPINGOOPHORECTOMY Left    SPINE SURGERY  2009   TOTAL KNEE ARTHROPLASTY Right 82,98,99,00   DVT & PE post-op 1999   TOTAL KNEE ARTHROPLASTY Left 12/09/2019   Procedure: TOTAL KNEE ARTHROPLASTY;  Surgeon: Eugenia Mcalpineollins, Robert, MD;  Location: [facility];  Service: Orthopedics;  Laterality: Left;  adductor canal  TRANSTHORACIC ECHOCARDIOGRAM  03/2012   EF=>55%, trace MR & TR, normal RSVP     OB History   No obstetric history on file.     Family History  Problem Relation Age of Onset   Heart failure Father    Heart disease Father    Diabetes Mother    Hypertension Mother    Heart disease Mother        CHF   Hypertension Sister    Colon cancer Maternal Aunt    Lung cancer Maternal Uncle    Lung cancer Paternal Uncle    Lymphoma Maternal Grandfather    Heart failure Maternal Grandmother    Diabetes Paternal Grandfather    Heart Problems Paternal Grandfather    Alzheimer's disease Paternal Grandmother     Social History   Tobacco Use   Smoking status: Never    Smokeless tobacco: Never  Vaping Use   Vaping Use: Never used  Substance Use Topics   Alcohol use: Not Currently   Drug use: No    Home Medications Prior to Admission medications   Medication Sig Start Date End Date Taking? Authorizing Provider  candesartan-hydrochlorothiazide (ATACAND HCT) 16-12.5 MG tablet Take 1 tablet by mouth every morning. 03/31/19  Yes [provider]  famotidine (PEPCID) 40 MG tablet Take 40 mg by mouth at bedtime.  01/24/18  Yes [provider]  gabapentin (NEURONTIN) 100 MG capsule Take 100-300 mg by mouth See admin instructions. Take 100 mg in the morning, 100 mg in the evening, and 300 mg at bedtime   Yes [provider]  morphine (MS CONTIN) 15 MG 12 hr tablet Take 15 mg by mouth 3 (three) times daily.  09/14/13  Yes [provider]  pantoprazole (PROTONIX) 40 MG tablet Take 40 mg by mouth 2 (two) times daily.  06/29/17  Yes [provider]  rivaroxaban (XARELTO) 10 MG TABS tablet Take 1 tablet (10 mg total) by mouth daily. Need appointment 07/03/20 03/18/21 Yes Hilty, Lisette Abu, MD  FLUoxetine (PROZAC) 40 MG capsule Take 40 mg daily by mouth.    [provider]  HYDROcodone-acetaminophen (NORCO) 5-325 MG per tablet Take 1 tablet by mouth every 8 (eight) hours as needed for moderate pain.     [provider]  linaclotide (LINZESS) 145 MCG CAPS capsule Take 145 mcg by mouth daily.    [provider]  Liniments (DEEP BLUE RELIEF EX) Apply 1 application topically daily as needed (pain).    [provider]  lubiprostone (AMITIZA) 24 MCG capsule Take 1 capsule (24 mcg total) by mouth 2 (two) times daily with a meal. 10/27/13   Zanard, Hinton Dyer, MD  methocarbamol (ROBAXIN) 500 MG tablet Take 1 tablet (500 mg total) by mouth 4 (four) times daily. 12/09/19   Cherie Dark, PA  Pediatric Multivitamins-Iron (CHILDRENS MULTIVITAMIN/IRON PO) Take 1 tablet by mouth daily. Flinstones    [provider]  Semaglutide,0.25 or 0.5MG /DOS, (OZEMPIC, 0.25 OR 0.5 MG/DOSE,) 2 MG/1.5ML SOPN Inject 0.5 mg as directed every Monday.  03/30/19   [provider]  SUMAtriptan (IMITREX) 100 MG tablet Take 1 tablet (100 mg total) by mouth every 2 (two) hours as needed for migraine. 10/27/13   Gillian Scarce, MD  topiramate (TOPAMAX) 100 MG tablet Take 100 mg by mouth at bedtime. 09/17/19   [provider]    Allergies    Almond oil, Nsaids, Adhesive [tape], Penicillins, Tolmetin, and Vancomycin  Review of Systems   Review of  Systems  Cardiovascular:  Positive for chest pain.  All other systems reviewed and are negative except that which was mentioned in HPI  Physical Exam Updated Vital Signs BP (!) 96/59   Pulse 61   Temp 98.2 F (36.8 C) (Oral)   Resp 13   Ht 5\' 8"  (1.727 m)   Wt 125.6 kg   SpO2 100%   BMI 42.12 kg/m   Physical Exam Vitals and nursing note reviewed.  Constitutional:      General: She is not in acute distress.    Appearance: Normal appearance. She is well-developed. She is obese.  HENT:     Head: Normocephalic and atraumatic.  Eyes:     Conjunctiva/sclera: Conjunctivae normal.  Cardiovascular:     Rate and Rhythm: Normal rate and regular rhythm.     Heart sounds: Normal heart sounds. No murmur heard. Pulmonary:     Effort: Pulmonary effort is normal.     Breath sounds: Normal breath sounds.  Abdominal:     General: Abdomen is flat. Bowel sounds are normal. There is no distension.     Palpations: Abdomen is soft.     Tenderness: There is no abdominal tenderness.  Musculoskeletal:     Right lower leg: No edema.     Left lower leg: No edema.  Skin:    General: Skin is warm and dry.  Neurological:     Mental Status: She is alert and oriented to person, place, and time.     Comments: fluent  Psychiatric:        Mood and Affect: Mood normal.        Behavior: Behavior normal.    ED Results / Procedures / Treatments   Labs (all labs ordered are  listed, but only abnormal results are displayed) Labs Reviewed  BASIC METABOLIC PANEL  CBC  PREGNANCY, URINE  TROPONIN I (HIGH SENSITIVITY)    EKG EKG Interpretation  Date/Time:  Monday March 18 2021 13:20:09 EDT Ventricular Rate:  64 PR Interval:  170 QRS Duration: 96 QT Interval:  402 QTC Calculation: 414 R Axis:   29 Text Interpretation: Normal sinus rhythm Normal ECG No significant change since last tracing Confirmed by 11-02-1982 [phone]) on 03/18/2021 2:11:31 PM  Radiology DG Chest Portable 1 View  Result Date: 03/18/2021 CLINICAL DATA:  Chest pain EXAM: PORTABLE CHEST 1 VIEW COMPARISON:  05/10/2010 FINDINGS: The heart size and mediastinal contours are within normal limits. Atherosclerotic calcification of the aortic knob. Mildly coarsened interstitial markings bilaterally. No focal airspace consolidation, pleural effusion, or pneumothorax. The visualized skeletal structures are unremarkable. IMPRESSION: Mildly coarsened interstitial markings bilaterally, which may reflect bronchitic type lung changes. No focal airspace consolidation. Electronically Signed   By: 05/12/2010 D.O.   On: 03/18/2021 13:56    Procedures Procedures   Medications Ordered in ED Medications - No data to display  ED Course  I have reviewed the triage vital signs and the nursing notes.  Pertinent labs & imaging results that were available during my care of the patient were reviewed by me and considered in my medical decision making (see chart for details).    MDM Rules/Calculators/A&P                           Comfortable on exam, no acute distress.  EKG without ischemic changes.  Chest x-ray with mild bronchitic changes, no wheezing on exam.  Obtained lab work including troponin as well as D-dimer.  Although she has underlying hypercoagulability and previous history of PE, she reports strict compliance with medication and has normal vital signs here and story that is not highly suggestive  of PE, therefore I feel D-dimer is appropriate.  I am signing the patient out to oncoming provider pending completion of her work-up. Final Clinical Impression(s) / ED Diagnoses Final diagnoses:  None    Rx / DC Orders ED Discharge Orders     None        Oddie Bottger, Ambrose [place], MD 03/18/21 1447

## 2021-03-18 NOTE — ED Provider Notes (Signed)
Care of the patient assumed at the change of shift. Here for atypical chest pain, pending a second troponin.  Physical Exam  BP (!) 98/51 (BP Location: Right Arm)   Pulse 69   Temp 98.2 F (36.8 C) (Oral)   Resp 18   Ht 5\' 8"  (1.727 m)   Wt 125.6 kg   SpO2 100%   BMI 42.12 kg/m   Physical Exam  ED Course/Procedures     Procedures  MDM  RN staff has had difficulty collecting a second troponin. Patient is frustrated and would like to avoid further attempts. I discussed using an to help locate a suitable vein, however she would like to go home now. She will call her PCP and/or Cardiologist's office to arrange follow up. Encouraged to return to the ER for any other concerns.        [place], MD 03/18/21 [phone]

## 2021-03-18 NOTE — Telephone Encounter (Signed)
Received call from pt stating for the past few days she's been experiencing on and off chest pain that now is pretty constant. She state it feels like pressure in the middle of her chest. Pt also report feeling nauseous.   Based on pt's current symptoms, nurse advised pt to report to ER for further evaluations. Pt verbalized understanding.

## 2021-03-18 NOTE — ED Notes (Signed)
Blue top obtained and sent to lab.

## 2021-03-18 NOTE — ED Notes (Signed)
RN stuck patient x2 in blood draw attempt without success.

## 2021-03-18 NOTE — ED Triage Notes (Signed)
Chest pain for about a week, starts in right chest and moves to to left. Little SHOB with it, nausea, no other symptoms.

## 2021-03-19 ENCOUNTER — Telehealth: Payer: Self-pay | Admitting: Internal Medicine

## 2021-03-19 NOTE — Telephone Encounter (Signed)
Return call to pt. She report she was seen in the ER yesterday as instructed due to chest pain. She state she was told her EKG and first troponin looked fine but ER nurse was unable to draw second troponin. Pt state she was very frustrated and requested to leave. Pt report she is still experiencing constant chest pain and requesting to be seen.  Nurse advised pt that if she is still experiencing pain, she should return to ER. Pt state she refuse and will only go back if it gets worse.   Appointment scheduled for tomorrow 7/27 with Gillian Shields, NP. Nurse again tried to encourage pt to return due to active CP, but refused.

## 2021-03-19 NOTE — Telephone Encounter (Signed)
Pt c/o of Chest Pain: STAT if CP now or developed within 24 hours  1. Are you having CP right now? yes  2. Are you experiencing any other symptoms (ex. SOB, nausea, vomiting, sweating)? no  3. How long have you been experiencing CP? A month  4. Is your CP continuous or coming and going? continuous  5. Have you taken Nitroglycerin?  No

## 2021-03-20 ENCOUNTER — Encounter (HOSPITAL_BASED_OUTPATIENT_CLINIC_OR_DEPARTMENT_OTHER): Payer: Self-pay | Admitting: Family

## 2021-03-20 ENCOUNTER — Ambulatory Visit (HOSPITAL_BASED_OUTPATIENT_CLINIC_OR_DEPARTMENT_OTHER): Payer: BC Managed Care – PPO | Admitting: Family

## 2021-03-20 ENCOUNTER — Other Ambulatory Visit: Payer: Self-pay

## 2021-03-20 VITALS — BP 90/62 | HR 56 | Ht 68.0 in | Wt 279.0 lb

## 2021-03-20 DIAGNOSIS — I700 Atherosclerosis of aorta: Secondary | ICD-10-CM

## 2021-03-20 DIAGNOSIS — R072 Precordial pain: Secondary | ICD-10-CM | POA: Diagnosis not present

## 2021-03-20 DIAGNOSIS — Z7901 Long term (current) use of anticoagulants: Secondary | ICD-10-CM | POA: Diagnosis not present

## 2021-03-20 DIAGNOSIS — R0609 Other forms of dyspnea: Secondary | ICD-10-CM

## 2021-03-20 DIAGNOSIS — M4800 Spinal stenosis, site unspecified: Secondary | ICD-10-CM | POA: Diagnosis not present

## 2021-03-20 DIAGNOSIS — R0600 Dyspnea, unspecified: Secondary | ICD-10-CM

## 2021-03-20 DIAGNOSIS — I10 Essential (primary) hypertension: Secondary | ICD-10-CM | POA: Diagnosis not present

## 2021-03-20 DIAGNOSIS — Z86718 Personal history of other venous thrombosis and embolism: Secondary | ICD-10-CM

## 2021-03-20 MED ORDER — HYDROCHLOROTHIAZIDE 12.5 MG PO CAPS: 12.5000 mg | ORAL_CAPSULE | Freq: Every day | ORAL | 1 refills | Status: DC

## 2021-03-20 NOTE — Patient Instructions (Addendum)
Medication Instructions:  Your physician has recommended you make the following change in your medication:   STOP Candesartan-HCTZ  START Hydrochlorothiazide 12.5mg  daily  *If you need a refill on your cardiac medications before your next appointment, please call your pharmacy*  Lab Work: None ordered today  Testing/Procedures: Your physician has requested that you have cardiac CT. Cardiac computed tomography (CT) is a painless test that uses an x-ray machine to take clear, detailed pictures of your heart. Please follow instruction sheet as given.   Follow-Up: At [facility], you and your health needs are our priority.  As part of our continuing mission to provide you with exceptional heart care, we have created designated Provider Care Teams.  These Care Teams include your primary Cardiologist (physician) and Advanced Practice Providers (APPs -  Physician Assistants and Nurse Practitioners) who all work together to provide you with the care you need, when you need it.  We recommend signing up for the patient portal called "MyChart".  Sign up information is provided on this After Visit Summary.  MyChart is used to connect with patients for Virtual Visits (Telemedicine).  Patients are able to view lab/test results, encounter notes, upcoming appointments, etc.  Non-urgent messages can be sent to your provider as well.   To learn more about what you can do with MyChart, go to [email or web address].    Your next appointment:   As scheduled in August with Alver Sorrow, NP    Other Instructions    Your cardiac CT will be scheduled at one of the below locations:   [facility] [address] [place], [place] [address] [phone]  If scheduled at [facility], please arrive at the [facility] main entrance (entrance A) of [facility] 30 minutes prior to test start time. Proceed to the [facility] Radiology Department (first floor) to check-in and  test prep.  Please follow these instructions carefully (unless otherwise directed):  On the Night Before the Test: Be sure to Drink plenty of water. Do not consume any caffeinated/decaffeinated beverages or chocolate 12 hours prior to your test. Do not take any antihistamines 12 hours prior to your test.  On the Day of the Test: Drink plenty of water until 1 hour prior to the test. Do not eat any food 4 hours prior to the test. You may take your regular medications prior to the test.  Take metoprolol (Lopressor) two hours prior to test. HOLD Furosemide/Hydrochlorothiazide morning of the test. FEMALES- please wear underwire-free bra if available, avoid dresses & tight clothing      After the Test: Drink plenty of water. After receiving IV contrast, you may experience a mild flushed feeling. This is normal. On occasion, you may experience a mild rash up to 24 hours after the test. This is not dangerous. If this occurs, you can take Benadryl 25 mg and increase your fluid intake. If you experience trouble breathing, this can be serious. If it is severe call 911 IMMEDIATELY. If it is mild, please call our office. If you take any of these medications: Glipizide/Metformin, Avandament, Glucavance, please do not take 48 hours after completing test unless otherwise instructed.  Please allow 2-4 weeks for scheduling of routine cardiac CTs. Some insurance companies require a pre-authorization which may delay scheduling of this test.   For non-scheduling related questions, please contact the cardiac imaging nurse navigator should you have any questions/concerns: Rockwell Alexandria, Cardiac Imaging Nurse Navigator Larey Brick, Cardiac Imaging Nurse Navigator [place]  [facility] Direct Office Dial: [phone]   For scheduling needs, including cancellations and rescheduling, please call [place], [phone].

## 2021-03-20 NOTE — Progress Notes (Signed)
Office Visit    Patient Name: Ana Baker Date of Encounter: 03/20/2021  PCP:  Montez Hageman DO   [facility]  Cardiologist:  Chrystie Nose, MD  Advanced Practice Provider:  No care team member to display Electrophysiologist:  None   Chief Complaint    Ana Baker is a 55 y.o. female with a hx of recurrent DVT/PE on Xarelto, spinal stenosis, obesity s/p bariatric surgery, hypertension presents today for chest pain   Past Medical History    Past Medical History:  Diagnosis Date   Anemia    Anxiety    DDD (degenerative disc disease), lumbar    DDD (degenerative disc disease), lumbar    Depression    Diabetes mellitus without complication (HCC)    DVT (deep venous thrombosis) (HCC) 04/2010   and PE, in post-operative setting    Endometriosis    GERD (gastroesophageal reflux disease)    History of kidney stones    History of pulmonary embolism    Hypertension    Idiopathic parathyroidism (HCC)    Migraines    OA (osteoarthritis)    Obesity    Osteopenia    Osteoporosis    Pneumonia    x2   PONV (postoperative nausea and vomiting)    Spinal stenosis    Venous reflux    left leg   Vitamin D deficiency    Past Surgical History:  Procedure Laterality Date   ABDOMINAL HYSTERECTOMY  2008   BOWEL RESECTION  03/22/04   sbo   CHOLECYSTECTOMY OPEN  1990   COLONOSCOPY W/ POLYPECTOMY     GASTRIC BYPASS  07/31/2003   DVT post-op - IV filter   HERNIA REPAIR  08/2004   Internal hernia repair   JOINT REPLACEMENT     KIDNEY STONE SURGERY  98,00   KNEE ARTHROSCOPY Left 06,07   KNEE ARTHROSCOPY Left 12/2009   Dr. Thomasena Edis   LIPOMA EXCISION  03/12/04   right arm   NM MYOCAR PERF WALL MOTION  04/2010   dipyridamole; normal pattern of perfusion to all regions; EF 68%; low risk study    SALPINGOOPHORECTOMY Left    SPINE SURGERY  2009   TOTAL KNEE ARTHROPLASTY Right 82,98,99,00   DVT & PE post-op 1999   TOTAL KNEE ARTHROPLASTY Left 12/09/2019    Procedure: TOTAL KNEE ARTHROPLASTY;  Surgeon: Eugenia Mcalpine, MD;  Location: [facility];  Service: Orthopedics;  Laterality: Left;  adductor canal   TRANSTHORACIC ECHOCARDIOGRAM  03/2012   EF=>55%, trace MR & TR, normal RSVP    Allergies  Allergies  Allergen Reactions   Almond Oil Anaphylaxis and Rash    ALL ALMONDS!   Nsaids Nausea Only   Adhesive [Tape] Hives and Rash    Burns skin   Penicillins Rash    Did it involve swelling of the face/tongue/throat, SOB, or low BP? No Did it involve sudden or severe rash/hives, skin peeling, or any reaction on the inside of your mouth or nose? Yes Did you need to seek medical attention at a hospital or doctor's office? Was in the hospital when it happend When did it last happen?      21 years ago If all above answers are "NO", may proceed with cephalosporin use.    Tolmetin Nausea Only   Vancomycin Rash    History of Present Illness    Ana Baker is a 54 y.o. female with a hx of  recurrent DVT/PE on Xarelto, spinal stenosis,  obesity s/p bariatric surgery, hypertension last seen 07/03/20.  ED visit 03/18/21 for chest pain which was central to right sided and worse with activity. EKG with no acute ST/T wave changes. CXR with mild bronchitic changes. D-dimer and troponin unremarkable.  She presents today for follow up with her cousin. Tells me the chest pain started a week ago. She initially thought her breast breast was hurting but had mammogram in December which was unremarkable. However the pain to radiate from her right to left chest.. Tells me the chest pain is almost constant at this point. It is not a stabbing pain. Feels like a pressure. Not tender on palpation.  She has noted increased dyspnea on exertion. No cough or wheezing  Grandmother had CHF, dad CHF at 46  EKGs/Labs/Other Studies Reviewed:   The following studies were reviewed today:   EKG:  EKG is ordered today.  The ekg ordered today demonstrates SB 56 bpm with no acute  ST/T wave changes.   Recent Labs: 03/18/2021: ALT 19; BUN 12; Creatinine, Ser 0.77; Hemoglobin 13.6; Platelets 212; Potassium 4.1; Sodium 140  Recent Lipid Panel    Component Value Date/Time   CHOL 208 (H) 05/02/2014 0826   TRIG 107 05/02/2014 0826   HDL 67 05/02/2014 0826   CHOLHDL 3.1 05/02/2014 0826   VLDL 21 05/02/2014 0826   LDLCALC 120 (H) 05/02/2014 0826    Home Medications   Current Meds  Medication Sig   calcium carbonate (OSCAL) 1500 (600 Ca) MG TABS tablet Take by mouth 2 (two) times daily with a meal.   candesartan-hydrochlorothiazide (ATACAND HCT) 16-12.5 MG tablet Take 1 tablet by mouth every morning.   cholecalciferol (VITAMIN D3) 25 MCG (1000 UNIT) tablet Take 1,000 Units by mouth daily.   famotidine (PEPCID) 40 MG tablet Take 40 mg by mouth at bedtime.    FLUoxetine (PROZAC) 40 MG capsule Take 40 mg daily by mouth.   gabapentin (NEURONTIN) 300 MG capsule Take 300 mg by mouth 3 (three) times daily. Take one tablet 300mg  by mouth in the morning, and in the afternoon, take 2 tablets 600mg  at bedtime.   HYDROcodone-acetaminophen (NORCO/VICODIN) 5-325 MG tablet Take 1 tablet by mouth every 8 (eight) hours as needed for moderate pain.    l-methylfolate-B6-B12 (METANX) 3-35-2 MG TABS tablet Take 1 tablet by mouth daily.   lubiprostone (AMITIZA) 24 MCG capsule Take 1 capsule (24 mcg total) by mouth 2 (two) times daily with a meal.   Methylcobalamin 1 MG CHEW Chew by mouth.   morphine (MS CONTIN) 15 MG 12 hr tablet Take 15 mg by mouth 3 (three) times daily.    pantoprazole (PROTONIX) 40 MG tablet Take 40 mg by mouth 2 (two) times daily.    Pediatric Multivitamins-Iron (CHILDRENS MULTIVITAMIN/IRON PO) Take 1 tablet by mouth daily. Flinstones   Pyridoxal-5 Phosphate 100 MG/ML SOLN Inject as directed.   rivaroxaban (XARELTO) 10 MG TABS tablet Take 1 tablet (10 mg total) by mouth daily. Need appointment   SUMAtriptan (IMITREX) 100 MG tablet Take 1 tablet (100 mg total) by mouth  every 2 (two) hours as needed for migraine.   Teriparatide, Recombinant, (FORTEO) 600 MCG/2.4ML SOPN Forteo 20 mcg/dose (600 mcg/2.4 mL) subcutaneous pen injector   topiramate (TOPAMAX) 100 MG tablet Take 100 mg by mouth at bedtime.   [DISCONTINUED] gabapentin (NEURONTIN) 100 MG capsule Take 300 mg by mouth See admin instructions. 300 in the morning, 300 in the afternoon, 600 before bed.     Review of Systems  All other systems reviewed and are otherwise negative except as noted above.  Physical Exam    VS:  BP 90/62   Pulse (!) 56   Ht 5\' 8"  (1.727 m)   Wt 279 lb (126.6 kg)   SpO2 95%   BMI 42.42 kg/m  , BMI Body mass index is 42.42 kg/m.  Wt Readings from Last 3 Encounters:  03/20/21 279 lb (126.6 kg)  03/18/21 277 lb (125.6 kg)  07/03/20 259 lb (117.5 kg)     GEN: Well nourished, overweight, well developed, in no acute distress. HEENT: normal. Neck: Supple, no JVD, carotid bruits, or masses. Cardiac: RRR, no murmurs, rubs, or gallops. No clubbing, cyanosis, edema.  Radials/PT 2+ and equal bilaterally.  Respiratory:  Respirations regular and unlabored, clear to auscultation bilaterally. GI: Soft, nontender, nondistended. MS: No deformity or atrophy. Skin: Warm and dry, no rash. Neuro:  Strength and sensation are intact. Psych: Normal affect.  Assessment & Plan    Chest pain / DOE / aortic atherosclerosis -  EKG today NSR no acute changes. ED visit with CXR showing possible bronchitic changes. Troponin and d-dimer unremarkable. Chest pain has both typical and atypical features. Risk factors include aortic atherosclerosis, obesity, DM2, family history of coronary disease plan for cardiac CTA to rule out ischemia. Consider etiology CAD, INOCA, musculoskeletal, GERD.  Spinal Stenosis - exercise tolerance limited. Continue to follow with PCP.   Recurrent DVT/PE on Xarelto - No bleeding complications. Denies missed doses. Continue Xarelto 20mg  QD.  Obesity - Weight loss  via diet and exercise encouraged. Discussed the impact being overweight would have on cardiovascular risk.   Disposition: Follow up in 1 month(s) with 13/09/21, NP    Signed, , NP 03/20/2021, 10:31 AM [facility]

## 2021-03-27 ENCOUNTER — Telehealth (HOSPITAL_COMMUNITY): Payer: Self-pay | Admitting: *Deleted

## 2021-03-27 NOTE — Telephone Encounter (Signed)
Reaching out to patient to offer assistance regarding upcoming cardiac imaging study; pt verbalizes understanding of appt date/time, parking situation and where to check in, pre-test NPO status and medications ordered, and verified current allergies; name and call back number provided for further questions should they arise 

## 2021-03-28 ENCOUNTER — Other Ambulatory Visit: Payer: Self-pay | Admitting: *Deleted

## 2021-03-28 ENCOUNTER — Ambulatory Visit (HOSPITAL_COMMUNITY)
Admission: RE | Admit: 2021-03-28 | Discharge: 2021-03-28 | Disposition: A | Discharge status: Home / Self Care | Payer: BC Managed Care – PPO | Source: Ambulatory Visit | Attending: Family | Admitting: Family

## 2021-03-28 ENCOUNTER — Other Ambulatory Visit: Payer: Self-pay

## 2021-03-28 DIAGNOSIS — R072 Precordial pain: Secondary | ICD-10-CM | POA: Insufficient documentation

## 2021-03-28 MED ORDER — ROSUVASTATIN CALCIUM 20 MG PO TABS: 20.0000 mg | ORAL_TABLET | Freq: Every day | ORAL | 6 refills | Status: DC

## 2021-03-28 MED ORDER — NITROGLYCERIN 0.4 MG SL SUBL
SUBLINGUAL_TABLET | SUBLINGUAL | Status: AC
  Filled 2021-03-28: qty 2

## 2021-03-28 MED ORDER — IOHEXOL 350 MG/ML SOLN
100.0000 mL | Freq: Once | INTRAVENOUS | Status: AC | PRN
  Administered 2021-03-28: 100 mL via INTRAVENOUS

## 2021-03-28 MED ORDER — NITROGLYCERIN 0.4 MG SL SUBL
0.8000 mg | SUBLINGUAL_TABLET | Freq: Once | SUBLINGUAL | Status: AC
  Administered 2021-03-28: 0.8 mg via SUBLINGUAL

## 2021-03-28 NOTE — Progress Notes (Signed)
CT scan completed. Tolerated wll. D/C home ambulatory, awake and alert. In no distress

## 2021-04-15 ENCOUNTER — Ambulatory Visit (INDEPENDENT_AMBULATORY_CARE_PROVIDER_SITE_OTHER): Payer: BC Managed Care – PPO | Admitting: Family

## 2021-04-15 ENCOUNTER — Encounter (HOSPITAL_BASED_OUTPATIENT_CLINIC_OR_DEPARTMENT_OTHER): Payer: Self-pay | Admitting: Family

## 2021-04-15 ENCOUNTER — Other Ambulatory Visit: Payer: Self-pay

## 2021-04-15 VITALS — BP 120/76 | HR 68 | Ht 67.75 in | Wt 287.4 lb

## 2021-04-15 DIAGNOSIS — Z86718 Personal history of other venous thrombosis and embolism: Secondary | ICD-10-CM

## 2021-04-15 DIAGNOSIS — R0683 Snoring: Secondary | ICD-10-CM

## 2021-04-15 DIAGNOSIS — I25118 Atherosclerotic heart disease of native coronary artery with other forms of angina pectoris: Secondary | ICD-10-CM | POA: Diagnosis not present

## 2021-04-15 DIAGNOSIS — E785 Hyperlipidemia, unspecified: Secondary | ICD-10-CM

## 2021-04-15 DIAGNOSIS — Z7901 Long term (current) use of anticoagulants: Secondary | ICD-10-CM | POA: Diagnosis not present

## 2021-04-15 DIAGNOSIS — I700 Atherosclerosis of aorta: Secondary | ICD-10-CM

## 2021-04-15 DIAGNOSIS — G4730 Sleep apnea, unspecified: Secondary | ICD-10-CM

## 2021-04-15 MED ORDER — NITROGLYCERIN 0.4 MG SL SUBL: 0.4000 mg | SUBLINGUAL_TABLET | SUBLINGUAL | 2 refills | Status: DC | PRN

## 2021-04-15 MED ORDER — METOPROLOL SUCCINATE ER 25 MG PO TB24: 12.5000 mg | ORAL_TABLET | Freq: Every day | ORAL | 5 refills | Status: DC

## 2021-04-15 NOTE — Patient Instructions (Signed)
Medication Instructions:  Your physician has recommended you make the following change in your medication:   START Metoprolol Succinate (Toprol) half tablet (12.5mg ) once per day  START Metoprolol as needed for chest pain   *If you need a refill on your cardiac medications before your next appointment, please call your pharmacy*  Lab Work: None ordered today.   Our goal is for your LDL (bad cholesterol) to be less than 70. It was 93 when checked in May.   Testing/Procedures: Your cardiac CTA showed mild plaque in your heart which is not enough to cause obstruction but tells [place] to pay attention and reduce your risk of further plaque buildup.   Follow-Up: At [facility], you and your health needs are our priority.  As part of our continuing mission to provide you with exceptional heart care, we have created designated Provider Care Teams.  These Care Teams include your primary Cardiologist (physician) and Advanced Practice Providers (APPs -  Physician Assistants and Nurse Practitioners) who all work together to provide you with the care you need, when you need it.  We recommend signing up for the patient portal called "MyChart".  Sign up information is provided on this After Visit Summary.  MyChart is used to connect with patients for Virtual Visits (Telemedicine).  Patients are able to view lab/test results, encounter notes, upcoming appointments, etc.  Non-urgent messages can be sent to your provider as well.   To learn more about what you can do with MyChart, go to [email or web address].    Your next appointment:   2-3 month(s)  The format for your next appointment:   In Person  Provider:   Kirtland Bouchard [place] Hilty, MD or Alver Sorrow, NP    Other Instructions  Heart Healthy Diet Recommendations: A low-salt diet is recommended. Meats should be grilled, baked, or boiled. Avoid fried foods. Focus on lean protein sources like fish or chicken with vegetables and fruits. The American  Heart Association is a Chief Technology Officer!    Exercise recommendations: The American Heart Association recommends 150 minutes of moderate intensity exercise weekly. Try 30 minutes of moderate intensity exercise 4-5 times per week. This could include walking, jogging, or swimming.  You can try walking in the pool at the [facility].   For coronary artery disease often called "heart disease" we aim for optimal guideline directed medical therapy. We use the "A, B, C"s to help keep [place] on track!  A = Aspirin 81mg  daily B = Beta blocker which helps to relax the heart. This is your Metoprolol. C = Cholesterol control. You take Rosuvastatin to help control your cholesterol.  D = Don't forget nitroglycerin! This is an emergency tablet to be used if you have chest pain.

## 2021-04-15 NOTE — Progress Notes (Addendum)
Office Visit    Patient Name: Ana Baker Date of Encounter: 04/15/2021  PCP:  Montez Hagemanurner, [place] C, DO   [facility]  Cardiologist:  Chrystie NoseKenneth C Hilty, MD  Advanced Practice Provider:  No care team member to display Electrophysiologist:  None   Chief Complaint    Ana Baker is a 55 y.o. female with a hx of recurrent DVT/PE on Xarelto, spinal stenosis, obesity s/p bariatric surgery, hypertension presents today for chest pain   Past Medical History    Past Medical History:  Diagnosis Date   Anemia    Anxiety    DDD (degenerative disc disease), lumbar    DDD (degenerative disc disease), lumbar    Depression    Diabetes mellitus without complication (HCC)    DVT (deep venous thrombosis) (HCC) 04/2010   and PE, in post-operative setting    Endometriosis    GERD (gastroesophageal reflux disease)    History of kidney stones    History of pulmonary embolism    Hypertension    Idiopathic parathyroidism (HCC)    Migraines    OA (osteoarthritis)    Obesity    Osteopenia    Osteoporosis    Pneumonia    x2   PONV (postoperative nausea and vomiting)    Spinal stenosis    Venous reflux    left leg   Vitamin D deficiency    Past Surgical History:  Procedure Laterality Date   ABDOMINAL HYSTERECTOMY  2008   BOWEL RESECTION  03/22/04   sbo   CHOLECYSTECTOMY OPEN  1990   COLONOSCOPY W/ POLYPECTOMY     GASTRIC BYPASS  07/31/2003   DVT post-op - IV filter   HERNIA REPAIR  08/2004   Internal hernia repair   JOINT REPLACEMENT     KIDNEY STONE SURGERY  98,00   KNEE ARTHROSCOPY Left 06,07   KNEE ARTHROSCOPY Left 12/2009   Dr. Thomasena Edisollins   LIPOMA EXCISION  03/12/04   right arm   NM MYOCAR PERF WALL MOTION  04/2010   dipyridamole; normal pattern of perfusion to all regions; EF 68%; low risk study    SALPINGOOPHORECTOMY Left    SPINE SURGERY  2009   TOTAL KNEE ARTHROPLASTY Right 82,98,99,00   DVT & PE post-op 1999   TOTAL KNEE ARTHROPLASTY Left 12/09/2019    Procedure: TOTAL KNEE ARTHROPLASTY;  Surgeon: Eugenia Mcalpineollins, Robert, MD;  Location: [facility];  Service: Orthopedics;  Laterality: Left;  adductor canal   TRANSTHORACIC ECHOCARDIOGRAM  03/2012   EF=>55%, trace MR & TR, normal RSVP    Allergies  Allergies  Allergen Reactions   Almond Oil Anaphylaxis and Rash    ALL ALMONDS!   Nsaids Nausea Only   Adhesive [Tape] Hives and Rash    Burns skin   Penicillins Rash    Did it involve swelling of the face/tongue/throat, SOB, or low BP? No Did it involve sudden or severe rash/hives, skin peeling, or any reaction on the inside of your mouth or nose? Yes Did you need to seek medical attention at a hospital or doctor's office? Was in the hospital when it happend When did it last happen?      21 years ago If all above answers are "NO", may proceed with cephalosporin use.    Tolmetin Nausea Only   Vancomycin Rash    History of Present Illness    Ana Baker is a 55 y.o. female with a hx of  recurrent DVT/PE on Xarelto, spinal stenosis,  obesity s/p bariatric surgery, hypertension last seen 03/20/21. Family history notable for heart failure in her grandmother and father.  ED visit 03/18/21 for chest pain which was central to right sided and worse with activity. EKG with no acute ST/T wave changes. CXR with mild bronchitic changes. D-dimer and troponin unremarkable.  Seen in follow up 03/20/21 with reports of 1 week history of chest pain radiating from right to left chest described as a pressure. Given hypotension, candesartan-HCTZ was transitioned to HCTZ alone. Cardiac CTA arranged.  Cardiac CTA 03/28/2021 with coronary calcium score of 185 placing her in the 98th percentile for age, sex, race matched control.  She had mild mixed density plaque 25-49% in the mid LAD and minimal calcified plaque in the mid and distal RCA less than 25%.  Preventative therapy and risk factor modification were recommended.  Rosuvastatin 20 mg daily was initiated.  Presents today  for follow-up. Tells me she has had intermitent episodes of chest discomfort since last seen. Is typically lasting less than 30 minutes. More often occurs when she is moving. No palpitations, no shortness of breath with the pain.  Reports some dyspnea with exertion with more than usual activity which she attributes to weight gain which has been ongoing.  She has started using her exercise bike for 10 minutes/day without difficulty.Does try to stay home as she has family members with cancer who she tries to protect.  She is also considering walking in the pool as her back doctor told her this would be helpful. Notes blood pressure at home has been 120s for the most part.  No recurrent hypotension since discontinuation of candesartan.  Does note mild headaches with longstanding history of migraines but these are overall improving.  EKGs/Labs/Other Studies Reviewed:   The following studies were reviewed today:  Cardiac CTA 03/28/21 FINDINGS: Image quality: Excellent.   Noise artifact is: Limited.   Coronary Arteries:  Normal coronary origin.  Right dominance.   Left main: The left main is a large caliber vessel with a normal take off from the left coronary cusp that bifurcates to form a left anterior descending artery and a left circumflex artery. There is no plaque or stenosis.   Left anterior descending artery: The proximal LAD is patent. The mid LAD contains mild mixed density plaque (25-49%). The distal LAD is hypoplastic and the LAD terminates as a second diagonal branch. The first diagonal is small and patent.   Left circumflex artery: The LCX is non-dominant and patent with no evidence of plaque or stenosis. The LCX gives off 1 patent obtuse marginal branch.   Right coronary artery: The RCA is dominant with normal take off from the right coronary cusp. There is minimal calcified plaque (<25%) in the mid and distal RCA. The RCA terminates as a PDA and right posterolateral branch  without evidence of plaque or stenosis.   Right Atrium: Right atrial size is within normal limits.   Right Ventricle: The right ventricular cavity is within normal limits.   Left Atrium: Left atrial size is normal in size with no left atrial appendage filling defect.   Left Ventricle: The ventricular cavity size is within normal limits. There are no stigmata of prior infarction. There is no abnormal filling defect.   Pulmonary arteries: Normal in size without proximal filling defect.   Pulmonary veins: Normal pulmonary venous drainage.   Pericardium: Normal thickness with no significant effusion or calcium present.   Cardiac valves: The aortic valve is trileaflet without significant calcification.  The mitral valve is normal structure without significant calcification.   Aorta: Normal caliber with no significant disease.   Extra-cardiac findings: See attached radiology report for non-cardiac structures.   IMPRESSION: 1. Coronary calcium score of 185. This was 98th percentile for age-, sex, and race-matched controls.   2. Normal coronary origin with right dominance.   3. Mild mixed density plaque (25-49%) in the mid LAD.   4. Minimal calcified plaque in the mid and distal RCA (<25%).   RECOMMENDATIONS: 1. Mild non-obstructive CAD (25-49%). Consider non-atherosclerotic causes of chest pain. Consider preventive therapy and risk factor modification.  EKG:  No EKG is ordered today.  The ekg independently reviewed from 03/20/2021 demonstrated SB 56 bpm with no acute ST/T wave changes.   Recent Labs: 03/18/2021: ALT 19; BUN 12; Creatinine, Ser 0.77; Hemoglobin 13.6; Platelets 212; Potassium 4.1; Sodium 140  Recent Lipid Panel    Component Value Date/Time   CHOL 208 (H) 05/02/2014 0826   TRIG 107 05/02/2014 0826   HDL 67 05/02/2014 0826   CHOLHDL 3.1 05/02/2014 0826   VLDL 21 05/02/2014 0826   LDLCALC 120 (H) 05/02/2014 0826    Home Medications   Current Meds   Medication Sig   calcium carbonate (OSCAL) 1500 (600 Ca) MG TABS tablet Take by mouth 2 (two) times daily with a meal.   cholecalciferol (VITAMIN D3) 25 MCG (1000 UNIT) tablet Take 1,000 Units by mouth daily.   famotidine (PEPCID) 40 MG tablet Take 40 mg by mouth at bedtime.    FLUoxetine (PROZAC) 40 MG capsule Take 40 mg daily by mouth.   gabapentin (NEURONTIN) 300 MG capsule Take 300 mg by mouth 3 (three) times daily. Take one tablet 300mg  by mouth in the morning, and in the afternoon, take 2 tablets 600mg  at bedtime.   hydrochlorothiazide (MICROZIDE) 12.5 MG capsule Take 1 capsule (12.5 mg total) by mouth daily.   HYDROcodone-acetaminophen (NORCO/VICODIN) 5-325 MG tablet Take 1 tablet by mouth every 8 (eight) hours as needed for moderate pain.    l-methylfolate-B6-B12 (METANX) 3-35-2 MG TABS tablet Take 1 tablet by mouth daily.   lubiprostone (AMITIZA) 24 MCG capsule Take 1 capsule (24 mcg total) by mouth 2 (two) times daily with a meal.   Methylcobalamin 1 MG CHEW Chew by mouth.   metoprolol succinate (TOPROL XL) 25 MG 24 hr tablet Take 0.5 tablets (12.5 mg total) by mouth daily.   morphine (MS CONTIN) 15 MG 12 hr tablet Take 15 mg by mouth 3 (three) times daily.    nitroGLYCERIN (NITROSTAT) 0.4 MG SL tablet Place 1 tablet (0.4 mg total) under the tongue every 5 (five) minutes as needed for chest pain.   pantoprazole (PROTONIX) 40 MG tablet Take 40 mg by mouth 2 (two) times daily.    Pediatric Multivitamins-Iron (CHILDRENS MULTIVITAMIN/IRON PO) Take 1 tablet by mouth daily. Flinstones   Pyridoxal-5 Phosphate 100 MG/ML SOLN Inject as directed.   rivaroxaban (XARELTO) 10 MG TABS tablet Take 10 mg by mouth daily.   rosuvastatin (CRESTOR) 20 MG tablet Take 1 tablet (20 mg total) by mouth daily.   SUMAtriptan (IMITREX) 100 MG tablet Take 1 tablet (100 mg total) by mouth every 2 (two) hours as needed for migraine.   Teriparatide, Recombinant, (FORTEO) 600 MCG/2.4ML SOPN Forteo 20 mcg/dose (600  mcg/2.4 mL) subcutaneous pen injector   topiramate (TOPAMAX) 100 MG tablet Take 100 mg by mouth at bedtime.   [DISCONTINUED] rivaroxaban (XARELTO) 10 MG TABS tablet Take 1 tablet (10 mg total) by  mouth daily. Need appointment (Patient taking differently: Take 10 mg by mouth daily.)     Review of Systems      All other systems reviewed and are otherwise negative except as noted above.  Physical Exam    VS:  BP 120/76 (BP Location: Left Arm, Patient Position: Sitting, Cuff Size: Large)   Pulse 68   Ht 5' 7.75" (1.721 m)   Wt 287 lb 6.4 oz (130.4 kg)   SpO2 96%   BMI 44.02 kg/m  , BMI Body mass index is 44.02 kg/m.  Wt Readings from Last 3 Encounters:  04/15/21 287 lb 6.4 oz (130.4 kg)  03/20/21 279 lb (126.6 kg)  03/18/21 277 lb (125.6 kg)     GEN: Well nourished, overweight, well developed, in no acute distress. HEENT: normal. Neck: Supple, no JVD, carotid bruits, or masses. Cardiac: RRR, no murmurs, rubs, or gallops. No clubbing, cyanosis, edema.  Radials/PT 2+ and equal bilaterally.  Respiratory:  Respirations regular and unlabored, clear to auscultation bilaterally. GI: Soft, nontender, nondistended. MS: No deformity or atrophy. Skin: Warm and dry, no rash. Neuro:  Strength and sensation are intact. Psych: Normal affect.  Assessment & Plan    Nonobstructive CAD / Aortic atherosclerosis-Cardiac CTA 03/2021 with moderate nonobstructive plaque in LAD 25 to 49% and minimal atherosclerosis in the RCA less than 25%. Recommended for preventative therapy and risk factor modification.  Continue rosuvastatin 20 mg daily.  Start Toprol 12.5 mg daily as well as as needed nitroglycerin.  Her occasional chest discomfort could be reflective of INOCA vs muscle spasm vs GERD. Hesitant to utilize AMlodipine given previous hypotension. Heart healthy diet and regular cardiovascular exercise encouraged.    HLD, LDL goal less than 70 -01/02/2021 direct LDL 94, total cholesterol 035, triglycerides  81, HDL 59.  Rosuvastatin 20 mg daily was initiated 03/28/2021 due to nonobstructive CAD.  Plan for repeat lipid panel in October with PCP. If LDL not at goal of <70, plan to increase Crestor to 40mg  vs add Zetia.   Spinal Stenosis - exercise tolerance limited. Continue to follow with PCP.   Recurrent DVT/PE on Xarelto - No bleeding complications. Denies missed doses. Continue Xarelto 10mg  QD.   Obesity - Weight loss via diet and exercise encouraged. Discussed the impact being overweight would have on cardiovascular risk and coronary artery disease.  Sleep disordered breathing / Snores - Addendum 04/24/21. Patient contacted the office after a trip with her sister who noted loud snoring and apneic episodes. She has a STOPBang Score of 5 and risk factors including HTN, obesity. As such, plan for sleep study.   Disposition: Follow up  in 2-3 month(s) with , NP    Signed, 04/26/21, NP 04/15/2021, 1:10 PM [facility]

## 2021-04-23 ENCOUNTER — Encounter (HOSPITAL_BASED_OUTPATIENT_CLINIC_OR_DEPARTMENT_OTHER): Payer: Self-pay

## 2021-04-23 DIAGNOSIS — R0681 Apnea, not elsewhere classified: Secondary | ICD-10-CM

## 2021-04-23 DIAGNOSIS — R0683 Snoring: Secondary | ICD-10-CM

## 2021-04-24 ENCOUNTER — Telehealth: Payer: Self-pay | Admitting: *Deleted

## 2021-04-24 NOTE — Telephone Encounter (Signed)
-----   Message from Lindell Spar, RN sent at 04/23/2021  2:15 PM EDT ----- Regarding: sleep study referral New sleep study referral per Gillian Shields, NP  Dx: witness apneic spells, snoring

## 2021-04-24 NOTE — Telephone Encounter (Signed)
Patient notified of sleep study appointment details. 

## 2021-04-30 ENCOUNTER — Encounter (HOSPITAL_BASED_OUTPATIENT_CLINIC_OR_DEPARTMENT_OTHER): Payer: Self-pay

## 2021-05-01 MED ORDER — METOPROLOL SUCCINATE ER 25 MG PO TB24: 25.0000 mg | ORAL_TABLET | Freq: Every day | ORAL | 1 refills | Status: DC

## 2021-05-14 DIAGNOSIS — G894 Chronic pain syndrome: Secondary | ICD-10-CM | POA: Diagnosis not present

## 2021-05-14 DIAGNOSIS — M545 Low back pain, unspecified: Secondary | ICD-10-CM | POA: Diagnosis not present

## 2021-05-14 DIAGNOSIS — Z79891 Long term (current) use of opiate analgesic: Secondary | ICD-10-CM | POA: Diagnosis not present

## 2021-05-14 DIAGNOSIS — D6869 Other thrombophilia: Secondary | ICD-10-CM | POA: Diagnosis not present

## 2021-05-17 ENCOUNTER — Other Ambulatory Visit: Payer: Self-pay | Admitting: Internal Medicine

## 2021-05-17 DIAGNOSIS — I82409 Acute embolism and thrombosis of unspecified deep veins of unspecified lower extremity: Secondary | ICD-10-CM

## 2021-05-17 NOTE — Telephone Encounter (Signed)
Prescription refill request for Xarelto received.  Indication:DVT Last office visit:8/22 Weight:130.4 kg Age:55 Scr:0.7 CrCl: 189.13 ml/min  Prescription refilled

## 2021-06-06 NOTE — Progress Notes (Signed)
Office Visit    Patient Name: Ana Baker Date of Encounter: 06/07/2021  PCP:  Montez Hageman DO   [facility]  Cardiologist:  Chrystie Nose, MD  Advanced Practice Provider:  No care team member to display Electrophysiologist:  None   Chief Complaint    Ana Baker is a 55 y.o. female with a hx of recurrent DVT/PE on Xarelto, spinal stenosis, obesity s/p bariatric surgery, hypertension, CAD presents today for follow up after addition of Metoprolol  Past Medical History    Past Medical History:  Diagnosis Date   Anemia    Anxiety    DDD (degenerative disc disease), lumbar    DDD (degenerative disc disease), lumbar    Depression    Diabetes mellitus without complication (HCC)    DVT (deep venous thrombosis) (HCC) 04/2010   and PE, in post-operative setting    Endometriosis    GERD (gastroesophageal reflux disease)    History of kidney stones    History of pulmonary embolism    Hypertension    Idiopathic parathyroidism (HCC)    Migraines    OA (osteoarthritis)    Obesity    Osteopenia    Osteoporosis    Pneumonia    x2   PONV (postoperative nausea and vomiting)    Spinal stenosis    Venous reflux    left leg   Vitamin D deficiency    Past Surgical History:  Procedure Laterality Date   ABDOMINAL HYSTERECTOMY  2008   BOWEL RESECTION  03/22/04   sbo   CHOLECYSTECTOMY OPEN  1990   COLONOSCOPY W/ POLYPECTOMY     GASTRIC BYPASS  07/31/2003   DVT post-op - IV filter   HERNIA REPAIR  08/2004   Internal hernia repair   JOINT REPLACEMENT     KIDNEY STONE SURGERY  98,00   KNEE ARTHROSCOPY Left 06,07   KNEE ARTHROSCOPY Left 12/2009   Dr. Thomasena Edis   LIPOMA EXCISION  03/12/04   right arm   NM MYOCAR PERF WALL MOTION  04/2010   dipyridamole; normal pattern of perfusion to all regions; EF 68%; low risk study    SALPINGOOPHORECTOMY Left    SPINE SURGERY  2009   TOTAL KNEE ARTHROPLASTY Right 82,98,99,00   DVT & PE post-op 1999   TOTAL  KNEE ARTHROPLASTY Left 12/09/2019   Procedure: TOTAL KNEE ARTHROPLASTY;  Surgeon: Eugenia Mcalpine, MD;  Location: [facility];  Service: Orthopedics;  Laterality: Left;  adductor canal   TRANSTHORACIC ECHOCARDIOGRAM  03/2012   EF=>55%, trace MR & TR, normal RSVP    Allergies  Allergies  Allergen Reactions   Almond Oil Anaphylaxis and Rash    ALL ALMONDS!   Nsaids Nausea Only   Adhesive [Tape] Hives and Rash    Burns skin   Penicillins Rash    Did it involve swelling of the face/tongue/throat, SOB, or low BP? No Did it involve sudden or severe rash/hives, skin peeling, or any reaction on the inside of your mouth or nose? Yes Did you need to seek medical attention at a hospital or doctor's office? Was in the hospital when it happend When did it last happen?      21 years ago If all above answers are "NO", may proceed with cephalosporin use.    Tolmetin Nausea Only   Vancomycin Rash    History of Present Illness    Ana Baker is a 55 y.o. female with a hx of  recurrent DVT/PE  on Xarelto, spinal stenosis, obesity s/p bariatric surgery, hypertension, CAD last seen 04/15/21. Family history notable for heart failure in her grandmother and father.  ED visit 03/18/21 for chest pain which was central to right sided and worse with activity. EKG with no acute ST/T wave changes. CXR with mild bronchitic changes. D-dimer and troponin unremarkable.  Seen in follow up 03/20/21 with reports of 1 week history of chest pain radiating from right to left chest described as a pressure. Given hypotension, candesartan-HCTZ was transitioned to HCTZ alone. Cardiac CTA arranged.  Cardiac CTA 03/28/2021 with coronary calcium score of 185 placing her in the 98th percentile for age, sex, race matched control.  She had mild mixed density plaque 25-49% in the mid LAD and minimal calcified plaque in the mid and distal RCA less than 25%.  Preventative therapy and risk factor modification were recommended.  Rosuvastatin 20 mg  daily was initiated.  She was seen 04/15/21 noting episodes of chest discomfort with exertion and dstable dyspnea on exertion. She was exercising 10 minutes per day on exercise bike without difficulty. No recurrent hypotension since discontinuation of candesartan. She was started on low dose Toprol for antianginal benefit. Increased to 25mg  QD with improvement in symptoms.   She presents today for follow up. Tested positive for COVID late September. Still with congestion and some facial tenderness. Wonders if her sinus are getting infected. Did have 1 week fever free and then low grade fever today. Encouraged to call primary care to discuss possible antibiotics. Reports no recurrent chest pain, pressure, tightness. Reports her dyspnea on exertion has improved with addition of Metoprolol. Does note stress as her aunt is in hospice in October, offered my condolences. Has bloodwork upcoming with primary care to check cholesterol 07/01/21.   EKGs/Labs/Other Studies Reviewed:   The following studies were reviewed today:  Cardiac CTA 03/28/21 FINDINGS: Image quality: Excellent.   Noise artifact is: Limited.   Coronary Arteries:  Normal coronary origin.  Right dominance.   Left main: The left main is a large caliber vessel with a normal take off from the left coronary cusp that bifurcates to form a left anterior descending artery and a left circumflex artery. There is no plaque or stenosis.   Left anterior descending artery: The proximal LAD is patent. The mid LAD contains mild mixed density plaque (25-49%). The distal LAD is hypoplastic and the LAD terminates as a second diagonal branch. The first diagonal is small and patent.   Left circumflex artery: The LCX is non-dominant and patent with no evidence of plaque or stenosis. The LCX gives off 1 patent obtuse marginal branch.   Right coronary artery: The RCA is dominant with normal take off from the right coronary cusp. There is minimal  calcified plaque (<25%) in the mid and distal RCA. The RCA terminates as a PDA and right posterolateral branch without evidence of plaque or stenosis.   Right Atrium: Right atrial size is within normal limits.   Right Ventricle: The right ventricular cavity is within normal limits.   Left Atrium: Left atrial size is normal in size with no left atrial appendage filling defect.   Left Ventricle: The ventricular cavity size is within normal limits. There are no stigmata of prior infarction. There is no abnormal filling defect.   Pulmonary arteries: Normal in size without proximal filling defect.   Pulmonary veins: Normal pulmonary venous drainage.   Pericardium: Normal thickness with no significant effusion or calcium present.   Cardiac valves: The  aortic valve is trileaflet without significant calcification. The mitral valve is normal structure without significant calcification.   Aorta: Normal caliber with no significant disease.   Extra-cardiac findings: See attached radiology report for non-cardiac structures.   IMPRESSION: 1. Coronary calcium score of 185. This was 98th percentile for age-, sex, and race-matched controls.   2. Normal coronary origin with right dominance.   3. Mild mixed density plaque (25-49%) in the mid LAD.   4. Minimal calcified plaque in the mid and distal RCA (<25%).   RECOMMENDATIONS: 1. Mild non-obstructive CAD (25-49%). Consider non-atherosclerotic causes of chest pain. Consider preventive therapy and risk factor modification.  EKG:  No EKG is ordered today.  The ekg independently reviewed from 03/20/2021 demonstrated SB 56 bpm with no acute ST/T wave changes.   Recent Labs: 03/18/2021: ALT 19; BUN 12; Creatinine, Ser 0.77; Hemoglobin 13.6; Platelets 212; Potassium 4.1; Sodium 140  Recent Lipid Panel    Component Value Date/Time   CHOL 208 (H) 05/02/2014 0826   TRIG 107 05/02/2014 0826   HDL 67 05/02/2014 0826   CHOLHDL 3.1 05/02/2014  0826   VLDL 21 05/02/2014 0826   LDLCALC 120 (H) 05/02/2014 0826    Home Medications   Current Meds  Medication Sig   calcium carbonate (OSCAL) 1500 (600 Ca) MG TABS tablet Take by mouth 2 (two) times daily with a meal.   cholecalciferol (VITAMIN D3) 25 MCG (1000 UNIT) tablet Take 1,000 Units by mouth daily.   famotidine (PEPCID) 40 MG tablet Take 40 mg by mouth at bedtime.    FLUoxetine (PROZAC) 40 MG capsule Take 40 mg daily by mouth.   gabapentin (NEURONTIN) 300 MG capsule Take 300 mg by mouth 3 (three) times daily. Take one tablet 300mg  by mouth in the morning, and in the afternoon, take 2 tablets 600mg  at bedtime.   hydrochlorothiazide (MICROZIDE) 12.5 MG capsule Take 1 capsule (12.5 mg total) by mouth daily.   HYDROcodone-acetaminophen (NORCO/VICODIN) 5-325 MG tablet Take 1 tablet by mouth every 8 (eight) hours as needed for moderate pain.    l-methylfolate-B6-B12 (METANX) 3-35-2 MG TABS tablet Take 1 tablet by mouth daily.   lubiprostone (AMITIZA) 24 MCG capsule Take 1 capsule (24 mcg total) by mouth 2 (two) times daily with a meal.   Methylcobalamin 1 MG CHEW Chew by mouth.   metoprolol succinate (TOPROL XL) 25 MG 24 hr tablet Take 1 tablet (25 mg total) by mouth daily.   morphine (MS CONTIN) 15 MG 12 hr tablet Take 15 mg by mouth 3 (three) times daily.    nitroGLYCERIN (NITROSTAT) 0.4 MG SL tablet Place 1 tablet (0.4 mg total) under the tongue every 5 (five) minutes as needed for chest pain.   pantoprazole (PROTONIX) 40 MG tablet Take 40 mg by mouth 2 (two) times daily.    Pediatric Multivitamins-Iron (CHILDRENS MULTIVITAMIN/IRON PO) Take 1 tablet by mouth daily. Flinstones   Pyridoxal-5 Phosphate 100 MG/ML SOLN Inject as directed.   rivaroxaban (XARELTO) 10 MG TABS tablet TAKE 1 TABLET (10 MG TOTAL) BY MOUTH DAILY. NEED APPOINTMENT   rosuvastatin (CRESTOR) 20 MG tablet Take 1 tablet (20 mg total) by mouth daily.   SUMAtriptan (IMITREX) 100 MG tablet Take 1 tablet (100 mg total)  by mouth every 2 (two) hours as needed for migraine.   Teriparatide, Recombinant, (FORTEO) 600 MCG/2.4ML SOPN Forteo 20 mcg/dose (600 mcg/2.4 mL) subcutaneous pen injector   topiramate (TOPAMAX) 100 MG tablet Take 100 mg by mouth at bedtime.  Review of Systems      All other systems reviewed and are otherwise negative except as noted above.  Physical Exam    VS:  BP 114/82   Pulse 71   Ht 5\' 7"  (1.702 m)   Wt 285 lb 6.4 oz (129.5 kg)   SpO2 96%   BMI 44.70 kg/m  , BMI Body mass index is 44.7 kg/m.  Wt Readings from Last 3 Encounters:  06/07/21 285 lb 6.4 oz (129.5 kg)  04/15/21 287 lb 6.4 oz (130.4 kg)  03/20/21 279 lb (126.6 kg)     GEN: Well nourished, overweight, well developed, in no acute distress. HEENT: normal. Neck: Supple, no JVD, carotid bruits, or masses. Cardiac: RRR, no murmurs, rubs, or gallops. No clubbing, cyanosis, edema.  Radials/PT 2+ and equal bilaterally.  Respiratory:  Respirations regular and unlabored, clear to auscultation bilaterally. GI: Soft, nontender, nondistended. MS: No deformity or atrophy. Skin: Warm and dry, no rash. Neuro:  Strength and sensation are intact. Psych: Normal affect.  Assessment & Plan    Nonobstructive CAD / Aortic atherosclerosis-Cardiac CTA 03/2021 with moderate nonobstructive plaque in LAD 25 to 49% and minimal atherosclerosis in the RCA less than 25%. Recommended for preventative therapy and risk factor modification.  Continue rosuvastatin 20 mg daily, Metoprolol 25mg  QD which has provided antianginal benefit.  Heart healthy diet and regular cardiovascular exercise encouraged.    HLD, LDL goal less than 70 -01/02/2021 direct LDL 94, total cholesterol , triglycerides 81, HDL 59.  Rosuvastatin 20 mg daily was initiated 03/28/2021 due to nonobstructive CAD.  Plan for repeat lipid panel in November with PCP. If LDL not at goal of <70, plan to increase Crestor to 40mg  vs add Zetia.   History of COVID 79 - diagnosed with  COVID a few weeks ago. Now with new sinus symptoms. Concern for secondary bacterial infection. Encouraged to call her primary care provider to discuss possible antibiotics.   Spinal Stenosis - Exercise tolerance limited. Continue to follow with PCP.   Recurrent DVT/PE on Xarelto - No bleeding complications. Denies missed doses. Continue Xarelto 10mg  QD.   Obesity - Weight loss via diet and exercise encouraged. Discussed the impact being overweight would have on cardiovascular risk and coronary artery disease.Could consider GLP1 in the future.   Sleep disordered breathing / Snores -  She has a STOPBang Score of 5 and risk factors including HTN, obesity. Sleep study upcoming.   Disposition: Follow up in 4 month(s) with Dr. December or APP  Signed, , NP 06/07/2021, 10:21 AM [facility]

## 2021-06-07 ENCOUNTER — Other Ambulatory Visit: Payer: Self-pay

## 2021-06-07 ENCOUNTER — Ambulatory Visit (HOSPITAL_BASED_OUTPATIENT_CLINIC_OR_DEPARTMENT_OTHER): Payer: BC Managed Care – PPO | Admitting: Family

## 2021-06-07 ENCOUNTER — Encounter (HOSPITAL_BASED_OUTPATIENT_CLINIC_OR_DEPARTMENT_OTHER): Payer: Self-pay | Admitting: Family

## 2021-06-07 VITALS — BP 114/82 | HR 71 | Ht 67.0 in | Wt 285.4 lb

## 2021-06-07 DIAGNOSIS — Z8616 Personal history of COVID-19: Secondary | ICD-10-CM

## 2021-06-07 DIAGNOSIS — E785 Hyperlipidemia, unspecified: Secondary | ICD-10-CM

## 2021-06-07 DIAGNOSIS — Z7901 Long term (current) use of anticoagulants: Secondary | ICD-10-CM

## 2021-06-07 DIAGNOSIS — I2510 Atherosclerotic heart disease of native coronary artery without angina pectoris: Secondary | ICD-10-CM | POA: Diagnosis not present

## 2021-06-07 DIAGNOSIS — M4800 Spinal stenosis, site unspecified: Secondary | ICD-10-CM | POA: Diagnosis not present

## 2021-06-07 NOTE — Patient Instructions (Signed)
Medication Instructions:  Continue your current medications.   *If you need a refill on your cardiac medications before your next appointment, please call your pharmacy*  Lab Work: None ordered today.  We will look at your upcoming labs from Dr. Norris Cross office.   Testing/Procedures: None ordered today.   Follow-Up: At [facility], you and your health needs are our priority.  As part of our continuing mission to provide you with exceptional heart care, we have created designated Provider Care Teams.  These Care Teams include your primary Cardiologist (physician) and Advanced Practice Providers (APPs -  Physician Assistants and Nurse Practitioners) who all work together to provide you with the care you need, when you need it.  We recommend signing up for the patient portal called "MyChart".  Sign up information is provided on this After Visit Summary.  MyChart is used to connect with patients for Virtual Visits (Telemedicine).  Patients are able to view lab/test results, encounter notes, upcoming appointments, etc.  Non-urgent messages can be sent to your provider as well.   To learn more about what you can do with MyChart, go to [email or web address].    Your next appointment:   4 month(s)  The format for your next appointment:   In Person  Provider:   You may see Chrystie Nose, MD or one of the following Advanced Practice Providers on your designated Care Team:   Theodore Demark, PA-C Juanda Crumble, PA-C Joni Reining, DNP, ANP Alver Sorrow, NP    Other Instructions  Heart Healthy Diet Recommendations: A low-salt diet is recommended. Meats should be grilled, baked, or boiled. Avoid fried foods. Focus on lean protein sources like fish or chicken with vegetables and fruits. The American Heart Association is a Chief Technology Officer!   Exercise recommendations: The American Heart Association recommends 150 minutes of moderate intensity exercise weekly. Try 30 minutes of  moderate intensity exercise 4-5 times per week. This could include walking, jogging, or swimming.

## 2021-06-12 ENCOUNTER — Ambulatory Visit (HOSPITAL_BASED_OUTPATIENT_CLINIC_OR_DEPARTMENT_OTHER): Payer: BC Managed Care – PPO | Admitting: Cardiovascular Disease

## 2021-07-01 ENCOUNTER — Encounter (HOSPITAL_BASED_OUTPATIENT_CLINIC_OR_DEPARTMENT_OTHER): Payer: Self-pay

## 2021-07-02 MED ORDER — ROSUVASTATIN CALCIUM 20 MG PO TABS: 20.0000 mg | ORAL_TABLET | Freq: Every day | ORAL | 3 refills | Status: DC

## 2021-07-08 ENCOUNTER — Telehealth: Payer: Self-pay

## 2021-07-08 NOTE — Telephone Encounter (Signed)
Patient with diagnosis of recurrent DVT on Xarelto for anticoagulation.    Procedure: colonoscopy/EGD Date of procedure: TBD   CrCl 104 (with adjusted body weight) Platelet count 293  (DVT reported in 1999 then again in 2011, has since decreased to prophylactic dose of Xarelto)  Per office protocol, patient can hold Xarelto for 2 days prior to procedure.   Patient will not need bridging with Lovenox (enoxaparin) around procedure.

## 2021-07-08 NOTE — Telephone Encounter (Signed)
   [facility] Pre-operative Risk Assessment    Patient Name: Ana Baker  DOB: 04-27-1966 MRN: [id number]  HEARTCARE STAFF:  - IMPORTANT!!!!!! Under Visit Info/Reason for Call, type in Other and utilize the format Clearance MM/DD/YY or Clearance TBD. Do not use dashes or single digits. - Please review there is not already an duplicate clearance open for this procedure. - If request is for dental extraction, please clarify the # of teeth to be extracted. - If the patient is currently at the dentist's office, call Pre-Op Callback Staff (MA/nurse) to input urgent request.  - If the patient is not currently in the dentist office, please route to the Pre-Op pool.  Request for surgical clearance:  What type of surgery is being performed? Colonoscopy/EGD  When is this surgery scheduled? TBD  What type of clearance is required (medical clearance vs. Pharmacy clearance to hold med vs. Both)? Pharmacy clearance   Are there any medications that need to be held prior to surgery and how long? Xarelto, 2 days prior to procedure   Practice name and name of physician performing surgery? [facility], Dr. Shana Chute  What is the office phone number? [phone]   7.   What is the office fax number? [phone]  8.   Anesthesia type (None, local, MAC, general) ? Not listed    Jacqulynn Cadet 07/08/2021, 2:37 PM  _________________________________________________________________   (provider comments below)

## 2021-07-08 NOTE — Telephone Encounter (Signed)
Covering preop today. Last OV 05/2021 with Gillian Shields. Xarelto appears to be for hx of recurrent DVT/PE but Dr. Rennis Golden follows. Will route to pharm then pt will need call.

## 2021-07-10 ENCOUNTER — Other Ambulatory Visit: Payer: Self-pay

## 2021-07-10 ENCOUNTER — Ambulatory Visit (HOSPITAL_BASED_OUTPATIENT_CLINIC_OR_DEPARTMENT_OTHER): Payer: BC Managed Care – PPO | Attending: Family | Admitting: Cardiovascular Disease

## 2021-07-10 DIAGNOSIS — G4719 Other hypersomnia: Secondary | ICD-10-CM

## 2021-07-10 DIAGNOSIS — R5383 Other fatigue: Secondary | ICD-10-CM | POA: Insufficient documentation

## 2021-07-10 DIAGNOSIS — R0681 Apnea, not elsewhere classified: Secondary | ICD-10-CM | POA: Diagnosis not present

## 2021-07-10 DIAGNOSIS — E119 Type 2 diabetes mellitus without complications: Secondary | ICD-10-CM | POA: Diagnosis not present

## 2021-07-10 DIAGNOSIS — R0683 Snoring: Secondary | ICD-10-CM | POA: Insufficient documentation

## 2021-07-10 NOTE — Telephone Encounter (Signed)
   Primary Cardiologist: Chrystie Nose, MD  Chart reviewed as part of pre-operative protocol coverage. Given past medical history and time since last visit, based on ACC/AHA guidelines, Ana Baker would be at acceptable risk for the planned procedure without further cardiovascular testing.   Patient with diagnosis of recurrent DVT on Xarelto for anticoagulation.     Procedure: colonoscopy/EGD Date of procedure: TBD     CrCl 104 (with adjusted body weight) Platelet count 293   (DVT reported in 1999 then again in 2011, has since decreased to prophylactic dose of Xarelto)   Per office protocol, patient can hold Xarelto for 2 days prior to procedure.   Patient will not need bridging with Lovenox (enoxaparin) around procedure.  I will route this recommendation to the requesting party via Epic fax function and remove from pre-op pool.  Please call with questions.  Thomasene Ripple. Skyler Carel NP-C    07/10/2021, 8:40 AM [facility] [address] Office [phone] Fax [phone]

## 2021-08-04 ENCOUNTER — Encounter (HOSPITAL_BASED_OUTPATIENT_CLINIC_OR_DEPARTMENT_OTHER): Payer: Self-pay | Admitting: Cardiovascular Disease

## 2021-08-04 NOTE — Procedures (Signed)
Patient Name: Ana Baker, Ana Baker Date: 07/10/2021 Gender: Female D.O.B: 1966/07/26 Age (years): 72 Referring Provider: Alver Sorrow NP Height (inches): 68 Interpreting Physician: Nicki Guadalajara MD, ABSM Weight (lbs): 280 RPSGT: Lowry Ram BMI: 43 MRN: [id number] Neck Size: 15.00  CLINICAL INFORMATION Sleep Study Type: NPSG  Indication for sleep study: Diabetes, Fatigue, Morbid Obesity, Obesity, Snoring  Epworth Sleepiness Score: 18  SLEEP STUDY TECHNIQUE As per the AASM Manual for the Scoring of Sleep and Associated Events v2.3 (April 2016) with a hypopnea requiring 4% desaturations.  The channels recorded and monitored were frontal, central and occipital EEG, electrooculogram (EOG), submentalis EMG (chin), nasal and oral airflow, thoracic and abdominal wall motion, anterior tibialis EMG, snore microphone, electrocardiogram, and pulse oximetry.  MEDICATIONS calcium carbonate (OSCAL) 1500 (600 Ca) MG TABS tablet cholecalciferol (VITAMIN D3) 25 MCG (1000 UNIT) tablet famotidine (PEPCID) 40 MG tablet FLUoxetine (PROZAC) 40 MG capsule gabapentin (NEURONTIN) 300 MG capsule hydrochlorothiazide (MICROZIDE) 12.5 MG capsule HYDROcodone-acetaminophen (NORCO/VICODIN) 5-325 MG tablet l-methylfolate-B6-B12 (METANX) 3-35-2 MG TABS tablet lubiprostone (AMITIZA) 24 MCG capsule Methylcobalamin 1 MG CHEW metoprolol succinate (TOPROL XL) 25 MG 24 hr tablet morphine (MS CONTIN) 15 MG 12 hr tablet nitroGLYCERIN (NITROSTAT) 0.4 MG SL tablet (Expired) pantoprazole (PROTONIX) 40 MG tablet Pediatric Multivitamins-Iron (CHILDRENS MULTIVITAMIN/IRON PO) Pyridoxal-5 Phosphate 100 MG/ML SOLN rivaroxaban (XARELTO) 10 MG TABS tablet rosuvastatin (CRESTOR) 20 MG tablet SUMAtriptan (IMITREX) 100 MG tablet Teriparatide, Recombinant, (FORTEO) 600 MCG/2.4ML SOPN topiramate (TOPAMAX) 100 MG tablet  Medications self-administered by patient taken the night of the study : FAMOTIDINE,  GABAPENTIN, ACETAMINOPHEN-HYDROCODONE, MORPHINE, PANTOPRAZOLE SODIUM, Rivaroxaban, TOPIRAMATE  SLEEP ARCHITECTURE The study was initiated at 10:46:22 PM and ended at 5:40:00 AM.  Sleep onset time was 30.6 minutes and the sleep efficiency was 72.6%%. The total sleep time was 300.5 minutes.  Stage REM latency was 163.5 minutes.  The patient spent 4.0%% of the night in stage N1 sleep, 80.7%% in stage N2 sleep, 0.0%% in stage N3 and 15.3% in REM.  Alpha intrusion was absent.  Supine sleep was 100.00%.  RESPIRATORY PARAMETERS The overall apnea/hypopnea index (AHI) was 1.8 per hour.  The respiratory index (RDI) was 1.8/h. There were 0 total apneas, including 0 obstructive, 0 central and 0 mixed apneas. There were 9 hypopneas and 0 RERAs.  The AHI during Stage REM sleep was 7.8 per hour.  AHI while supine was 1.8 per hour.  The mean oxygen saturation was 92.9%. The minimum SpO2 during sleep was 88.0%.  Soft snoring was noted during this study.  CARDIAC DATA The 2 lead EKG demonstrated sinus rhythm. The mean heart rate was 54.5 beats per minute. Other EKG findings include: None.  LEG MOVEMENT DATA The total PLMS were 0 with a resulting PLMS index of 0.0. Associated arousal with leg movement index was 0.0 .  IMPRESSIONS - No significant obstructive sleep apnea occurred during this study (AHI 1.8/h; RDI 1.8/h); however, mild sleep apnea was present during REM sleep. - The patient had minimal oxygen desaturation to a nadir of 88%. - The patient snored with soft snoring volume. - The patient was noted to transiently talk in her sleep. - No cardiac abnormalities were noted during this study. - Clinically significant periodic limb movements did not occur during sleep. No significant associated arousals.  DIAGNOSIS - Nocturnal Hypoxemia (G47.36) - Snoring - Excessive daytime sleepiness  RECOMMENDATIONS - At present, there is no indication for CPAP therapy. - Effort should be made to  optimize nasal and oropharyngeal patency.  -  If patient continues to have significant daytime sleepiness consider a multiple sleep latency test (MSLT) to evaluate for idiopathic hypersomnolence or narcolepsy.  - Avoid alcohol, sedatives and other CNS depressants that may worsen sleep apnea and disrupt normal sleep architecture. - Sleep hygiene should be reviewed to assess factors that may improve sleep quality. - Weight management (BMI 43) and regular exercise should be initiated or continued if appropriate.  [Electronically signed] 08/04/2021 06:11 PM  Nicki Guadalajara MD,  [facility], ABSM Diplomate, American Board of Sleep Medicine   NPI: [id number] [place] PH: [phone]   FX: [phone] ACCREDITED BY THE AMERICAN ACADEMY OF SLEEP MEDICINE

## 2021-08-05 ENCOUNTER — Telehealth: Payer: Self-pay | Admitting: *Deleted

## 2021-08-05 NOTE — Telephone Encounter (Signed)
-----   Message from Lennette Bihari, MD sent at 08/04/2021  6:18 PM EST ----- Burna Mortimer, please notify pt of results.

## 2021-08-05 NOTE — Telephone Encounter (Signed)
Patient notified of sleep study results and recommendations. She is declining further investigation into her daytime somnolence at this time. Stating " I'm alright for now." If she changes her mind and would like to have further testing, she will call me back.

## 2021-09-16 ENCOUNTER — Encounter (HOSPITAL_BASED_OUTPATIENT_CLINIC_OR_DEPARTMENT_OTHER): Payer: Self-pay

## 2021-09-16 ENCOUNTER — Other Ambulatory Visit (HOSPITAL_BASED_OUTPATIENT_CLINIC_OR_DEPARTMENT_OTHER): Payer: Self-pay

## 2021-09-16 DIAGNOSIS — I10 Essential (primary) hypertension: Secondary | ICD-10-CM

## 2021-09-16 MED ORDER — HYDROCHLOROTHIAZIDE 12.5 MG PO CAPS: 12.5000 mg | ORAL_CAPSULE | Freq: Every day | ORAL | 3 refills | Status: DC

## 2021-10-22 ENCOUNTER — Ambulatory Visit (HOSPITAL_BASED_OUTPATIENT_CLINIC_OR_DEPARTMENT_OTHER): Payer: BC Managed Care – PPO | Admitting: Internal Medicine

## 2021-11-19 ENCOUNTER — Ambulatory Visit (HOSPITAL_BASED_OUTPATIENT_CLINIC_OR_DEPARTMENT_OTHER): Payer: BC Managed Care – PPO | Admitting: Family

## 2021-11-29 ENCOUNTER — Other Ambulatory Visit (HOSPITAL_BASED_OUTPATIENT_CLINIC_OR_DEPARTMENT_OTHER): Payer: Self-pay | Admitting: Family

## 2021-11-29 ENCOUNTER — Encounter (HOSPITAL_BASED_OUTPATIENT_CLINIC_OR_DEPARTMENT_OTHER): Payer: Self-pay

## 2021-11-29 MED ORDER — METOPROLOL SUCCINATE ER 25 MG PO TB24: 25.0000 mg | ORAL_TABLET | Freq: Every day | ORAL | 3 refills | Status: DC

## 2021-12-17 ENCOUNTER — Other Ambulatory Visit: Payer: Self-pay | Admitting: Internal Medicine

## 2021-12-17 DIAGNOSIS — Z86711 Personal history of pulmonary embolism: Secondary | ICD-10-CM

## 2021-12-17 DIAGNOSIS — I82409 Acute embolism and thrombosis of unspecified deep veins of unspecified lower extremity: Secondary | ICD-10-CM

## 2021-12-17 NOTE — Telephone Encounter (Signed)
Prescription refill request for Xarelto received.  ?Indication: DVT/PE ?Last office visit: 06/07/21 Ana Baker)  ?DTOIZT:[id number] ?Age: 56 ?Scr: 0.87 (07/01/21) ?CrCl: 148.52ml/min ? ?Appropriate dose and refill sent to requested pharmacy.  ?

## 2021-12-27 ENCOUNTER — Ambulatory Visit (INDEPENDENT_AMBULATORY_CARE_PROVIDER_SITE_OTHER): Payer: Self-pay | Admitting: Family

## 2021-12-27 VITALS — BP 102/72 | HR 65 | Ht 68.0 in | Wt 289.1 lb

## 2021-12-27 DIAGNOSIS — E785 Hyperlipidemia, unspecified: Secondary | ICD-10-CM

## 2021-12-27 DIAGNOSIS — I700 Atherosclerosis of aorta: Secondary | ICD-10-CM

## 2021-12-27 DIAGNOSIS — Z7901 Long term (current) use of anticoagulants: Secondary | ICD-10-CM

## 2021-12-27 DIAGNOSIS — Z79899 Other long term (current) drug therapy: Secondary | ICD-10-CM

## 2021-12-27 DIAGNOSIS — I2510 Atherosclerotic heart disease of native coronary artery without angina pectoris: Secondary | ICD-10-CM

## 2021-12-27 DIAGNOSIS — Z86718 Personal history of other venous thrombosis and embolism: Secondary | ICD-10-CM

## 2021-12-27 DIAGNOSIS — M4800 Spinal stenosis, site unspecified: Secondary | ICD-10-CM

## 2021-12-27 MED ORDER — METOPROLOL SUCCINATE ER 25 MG PO TB24: 25.0000 mg | ORAL_TABLET | Freq: Every day | ORAL | 3 refills | Status: DC

## 2021-12-27 MED ORDER — ATORVASTATIN CALCIUM 40 MG PO TABS: 40.0000 mg | ORAL_TABLET | Freq: Every day | ORAL | 3 refills | Status: DC

## 2021-12-27 NOTE — Patient Instructions (Signed)
Medication Instructions:  ? ?Your physician has recommended you make the following change in your medication:   ? ?STOP Rosuvastatin ? ?START Atorvastatin one 40mg  tablet daily  ? ?*If you need a refill on your cardiac medications before your next appointment, please call your pharmacy* ? ? ?Lab Work: ?None ordered today.  ? ?Testing/Procedures: ?Your EKG today showed normal sinus rhythm. This is a good result! ? ? ?Follow-Up: ?At [facility], you and your health needs are our priority.  As part of our continuing mission to provide you with exceptional heart care, we have created designated Provider Care Teams.  These Care Teams include your primary Cardiologist (physician) and Advanced Practice Providers (APPs -  Physician Assistants and Nurse Practitioners) who all work together to provide you with the care you need, when you need it. ? ?We recommend signing up for the patient portal called "MyChart".  Sign up information is provided on this After Visit Summary.  MyChart is used to connect with patients for Virtual Visits (Telemedicine).  Patients are able to view lab/test results, encounter notes, upcoming appointments, etc.  Non-urgent messages can be sent to your provider as well.   ?To learn more about what you can do with MyChart, go to [facility].   ? ?Your next appointment:   ?6 month(s) ? ?The format for your next appointment:   ?In Person ? ?Provider:   ?[email or web address], NP ? ? ?Other Instructions ? ?IF you have only SEVEN tablets of your Xarelto left and we have not yet heard from patient assistance - CALL or send a MyChart message and we will consider switching to Eliquis.  ? ?Please fill out and mail in the [facility] financial assistance provided today. ? ?Our social work team will be reaching out to you. ? ?How to perform pursed lip breathing ?Being short of breath can make you tense and anxious. Before you start this breathing exercise, take a minute to relax your shoulders and close  your eyes. Then: ?Start the exercise by closing your mouth. ?Breathe in through your nose, taking a normal breath. You can do this at your normal rate of breathing. If you feel you are not getting enough air, breathe in while slowly counting to 2 or 3. ?Pucker (purse) your lips as if you were going to whistle. ?Gently tighten the muscles of your abdomenor press on your abdomen to help push the air out. ?Breathe out slowly through your pursed lips. Take at least twice as long to breathe out as it takes you to breathe in. ?Make sure that you breathe out all of the air, but do not force air out. ?Ask your health care provider how often and how long to do this exercise. ? ?

## 2021-12-27 NOTE — Progress Notes (Signed)
? ?Office Visit  ?  ?Patient Name: Ana Baker ?Date of Encounter: 12/27/2021 ? ?PCP:  Montez Hageman, DO ?  ?[facility]  ?Cardiologist:  Chrystie Nose, MD  ?Advanced Practice Provider:  No care team member to display ?Electrophysiologist:  None  ? ?Chief Complaint  ?  ?Ana Baker is a 56 y.o. female with a hx of recurrent DVT/PE on Xarelto, spinal stenosis, obesity s/p bariatric surgery, hypertension, CAD presents today for follow up of CAD ? ?Past Medical History  ?  ?Past Medical History:  ?Diagnosis Date  ? Anemia   ? Anxiety   ? DDD (degenerative disc disease), lumbar   ? DDD (degenerative disc disease), lumbar   ? Depression   ? Diabetes mellitus without complication (HCC)   ? DVT (deep venous thrombosis) (HCC) 04/2010  ? and PE, in post-operative setting   ? Endometriosis   ? GERD (gastroesophageal reflux disease)   ? History of kidney stones   ? History of pulmonary embolism   ? Hypertension   ? Idiopathic parathyroidism (HCC)   ? Migraines   ? OA (osteoarthritis)   ? Obesity   ? Osteopenia   ? Osteoporosis   ? Pneumonia   ? x2  ? PONV (postoperative nausea and vomiting)   ? Spinal stenosis   ? Venous reflux   ? left leg  ? Vitamin D deficiency   ? ?Past Surgical History:  ?Procedure Laterality Date  ? ABDOMINAL HYSTERECTOMY  2008  ? BOWEL RESECTION  03/22/04  ? sbo  ? CHOLECYSTECTOMY OPEN  1990  ? COLONOSCOPY W/ POLYPECTOMY    ? GASTRIC BYPASS  07/31/2003  ? DVT post-op - IV filter  ? HERNIA REPAIR  08/2004  ? Internal hernia repair  ? JOINT REPLACEMENT    ? KIDNEY STONE SURGERY  98,00  ? KNEE ARTHROSCOPY Left 06,07  ? KNEE ARTHROSCOPY Left 12/2009  ? Dr. Thomasena Edis  ? LIPOMA EXCISION  03/12/04  ? right arm  ? NM MYOCAR PERF WALL MOTION  04/2010  ? dipyridamole; normal pattern of perfusion to all regions; EF 68%; low risk study   ? SALPINGOOPHORECTOMY Left   ? SPINE SURGERY  2009  ? TOTAL KNEE ARTHROPLASTY Right 82,98,99,00  ? DVT & PE post-op 1999  ? TOTAL KNEE ARTHROPLASTY Left  12/09/2019  ? Procedure: TOTAL KNEE ARTHROPLASTY;  Surgeon: Eugenia Mcalpine, MD;  Location: [facility];  Service: Orthopedics;  Laterality: Left;  adductor canal  ? TRANSTHORACIC ECHOCARDIOGRAM  03/2012  ? EF=>55%, trace MR & TR, normal RSVP  ? ? ?Allergies ? ?Allergies  ?Allergen Reactions  ? Almond Oil Anaphylaxis and Rash  ?  ALL ALMONDS!  ? Nsaids Nausea Only  ? Adhesive [Tape] Hives and Rash  ?  Burns skin  ? Penicillins Rash  ?  Did it involve swelling of the face/tongue/throat, SOB, or low BP? No ?Did it involve sudden or severe rash/hives, skin peeling, or any reaction on the inside of your mouth or nose? Yes ?Did you need to seek medical attention at a hospital or doctor's office? Was in the hospital when it happend ?When did it last happen?      21 years ago ?If all above answers are ?NO?, may proceed with cephalosporin use. ?  ? Tolmetin Nausea Only  ? Vancomycin Rash  ? ? ?History of Present Illness  ?  ?Ana Baker is a 56 y.o. female with a hx of  recurrent DVT/PE on Xarelto,  spinal stenosis, obesity s/p bariatric surgery, hypertension, CAD last seen 06/07/2021. Family history notable for heart failure in her grandmother and father. ? ?ED visit 03/18/21 for chest pain which was central to right sided and worse with activity. EKG with no acute ST/T wave changes. CXR with mild bronchitic changes. D-dimer and troponin unremarkable. ? ?Seen in follow up 03/20/21 with reports of 1 week history of chest pain radiating from right to left chest described as a pressure. Given hypotension, candesartan-HCTZ was transitioned to HCTZ alone.  Subsequent cardiac CTA 03/28/2021 with coronary calcium score of 185 placing her in the 98th percentile for age, sex, race matched control.  She had mild mixed density plaque 25-49% in the mid LAD and minimal calcified plaque in the mid and distal RCA less than 25%.  Preventative therapy and risk factor modification were recommended.  Rosuvastatin 20 mg daily was initiated. ? ?She was  seen 04/15/21 noting episodes of chest discomfort with exertion and dstable dyspnea on exertion. She was started on low dose Toprol for antianginal benefit. Increased to 25mg  QD with improvement in symptoms. Clinic visit 05/2021 she was seen as she was recuperating from COVID the month prior.  However her dyspnea on exertion continue to improve with metoprolol.  She had sleep study November 2022 with no evidence of obstructive sleep apnea and only mild sleep apnea during REM sleep with no recommendation for CPAP. ? ?She presents today for follow-up.  In her spare time she enjoys crocheting and is considering beginning to quit.  Since last seen her sister has moved in with her.  Additionally notes that since last seen she has lost her disability through her previous employer and no longer has source of income or insurance coverage.  Her family has been helping her since December and she is awaiting hearing date from [employer].  She becomes understandably tearful when describing this as she wants to ensure she takes care of her health.  She is most concerned about the cost of rosuvastatin and Xarelto. Reports no shortness of breath nor dyspnea on exertion. Reports no chest pain, pressure, or tightness. No edema, orthopnea, PND. Reports no palpitations.   ? ?EKGs/Labs/Other Studies Reviewed:  ? ?The following studies were reviewed today: ? ?Cardiac CTA 03/28/21 ?FINDINGS: ?Image quality: Excellent. ?  ?Noise artifact is: Limited. ?  ?Coronary Arteries:  Normal coronary origin.  Right dominance. ?  ?Left main: The left main is a large caliber vessel with a normal ?take off from the left coronary cusp that bifurcates to form a left ?anterior descending artery and a left circumflex artery. There is no ?plaque or stenosis. ?  ?Left anterior descending artery: The proximal LAD is patent. The mid ?LAD contains mild mixed density plaque (25-49%). The distal LAD is ?hypoplastic and the LAD terminates as a second diagonal  branch. The ?first diagonal is small and patent. ?  ?Left circumflex artery: The LCX is non-dominant and patent with no ?evidence of plaque or stenosis. The LCX gives off 1 patent obtuse ?marginal branch. ?  ?Right coronary artery: The RCA is dominant with normal take off from ?the right coronary cusp. There is minimal calcified plaque (<25%) in ?the mid and distal RCA. The RCA terminates as a PDA and right ?posterolateral branch without evidence of plaque or stenosis. ?  ?Right Atrium: Right atrial size is within normal limits. ?  ?Right Ventricle: The right ventricular cavity is within normal ?limits. ?  ?Left Atrium: Left atrial size is normal in size  with no left atrial ?appendage filling defect. ?  ?Left Ventricle: The ventricular cavity size is within normal limits. ?There are no stigmata of prior infarction. There is no abnormal ?filling defect. ?  ?Pulmonary arteries: Normal in size without proximal filling defect. ?  ?Pulmonary veins: Normal pulmonary venous drainage. ?  ?Pericardium: Normal thickness with no significant effusion or ?calcium present. ?  ?Cardiac valves: The aortic valve is trileaflet without significant ?calcification. The mitral valve is normal structure without ?significant calcification. ?  ?Aorta: Normal caliber with no significant disease. ?  ?Extra-cardiac findings: See attached radiology report for ?non-cardiac structures. ?  ?IMPRESSION: ?1. Coronary calcium score of 185. This was 98th percentile for age-, ?sex, and race-matched controls. ?  ?2. Normal coronary origin with right dominance. ?  ?3. Mild mixed density plaque (25-49%) in the mid LAD. ?  ?4. Minimal calcified plaque in the mid and distal RCA (<25%). ?  ?RECOMMENDATIONS: ?1. Mild non-obstructive CAD (25-49%). Consider non-atherosclerotic ?causes of chest pain. Consider preventive therapy and risk factor ?modification. ? ?EKG:  EKG ordered today. EKG performed today reveals NSR 65 bpm with no acute ST/T wave changes.   ? ?Recent Labs: ?03/18/2021: ALT 19; BUN 12; Creatinine, Ser 0.77; Hemoglobin 13.6; Platelets 212; Potassium 4.1; Sodium 140  ?Recent Lipid Panel ?   ?Component Value Date/Time  ? CHOL 208 (H) 05/02/2014 08

## 2021-12-28 ENCOUNTER — Encounter (HOSPITAL_BASED_OUTPATIENT_CLINIC_OR_DEPARTMENT_OTHER): Payer: Self-pay | Admitting: Family

## 2021-12-30 ENCOUNTER — Telehealth: Payer: Self-pay | Admitting: Licensed Clinical Social Worker

## 2021-12-30 NOTE — Progress Notes (Signed)
?Heart and Vascular Care Navigation ? ?12/30/2021 ? ?Ana Baker ?[birth year] ?[id number] ? ?Reason for Referral:  ?Lost insurance, limited/no income, medication assistance ?Engaged with patient by telephone for initial visit for Heart and Vascular Care Coordination. ?                                                                                                  ?Assessment:                                     ?LCSW called and reached pt this morning at [phone]. Introduced self, role, reason for call. Pt confirmed home address, PCP, and a new email [email or web address]. She is in [place] counties[place]  She lives with her sister Jeanice Lim, it is her home but Jeanice Lim has been paying for the bills and they are not behind at this time. I shared if pt finds that they are getting behind to let us know as we may be able to assist by connecting pt with local resources or our Patient Care fund. Pt receives SNAP and is able to drive to and from appts. She has a disability application pending, no hearing assigned yet a this time- her previous employer has a company Financial risk analyst assisting her with this. ? ?Pt shares that she was covered through a previous employer but her COBRA ended January 31st 2023. She has had some concerns regarding affordability of care and medications.Pt completed Xarelto PAP while at the office, she has the [employer] and I walked her verbally through the application and how to complete it. Pt also interested in completing NCMedAssist, she is aware that it will only cover a few of her medications and she has been informed about GoodRx. We also discussed looking into coverage through marketplace if eligible. Pt states she spoke with a broker but was told with no income she couldn't enroll. I shared I would mail her the information about NCMedAssist and how to go about speaking with a Radio broadcast assistant through [employer].  ? ?HRT/VAS Care  Coordination   ? ? Patients Home Cardiology Office [facility]  ? Outpatient Care Team Social Worker  ? Social Worker Name: Esmeralda [facility] [phone]  ? Living arrangements for the past 2 months Single Family Home  ? Lives with: Siblings  ? Patient Has Concern With Paying Medical Bills Yes  ? Patient Concerns With Medical Bills no coverage currently  ? Medical Bill Referrals: CAFA and flyer for healthcare marketplace  ? Does Patient Have Prescription Coverage? No  ? Patient Prescription Assistance Programs [place] Medassist; Other  ? [place] Medassist Medications mailed application  ? Other Assistance Programs Medications goodrx card- pt utilizing  ? Home Assistive Devices/Equipment Eyeglasses; CBG Meter; Blood pressure cuff; Shower chair with back; Raised toilet seat with rails; Cane (specify quad or straight); Wheelchair  ? DME Agency [employer]  ? HH Agency NA  ? ?  ? ? ?Social History:                                                                             ?  SDOH Screenings  ? ?Alcohol Screen: Not on file  ?Depression (PHQ2-9): Not on file  ?Financial Resource Strain: Medium Risk  ? Difficulty of Paying Living Expenses: Somewhat hard  ?Food Insecurity: No Food Insecurity  ? Worried About Programme researcher, broadcasting/film/video in the Last Year: Never true  ? Ran Out of Food in the Last Year: Never true  ?Housing: Low Risk   ? Last Housing Risk Score: 0  ?Physical Activity: Not on file  ?Social Connections: Not on file  ?Stress: Not on file  ?Tobacco Use: Low Risk   ? Smoking Tobacco Use: Never  ? Smokeless Tobacco Use: Never  ? Passive Exposure: Not on file  ?Transportation Needs: No Transportation Needs  ? Lack of Transportation (Medical): No  ? Lack of Transportation (Non-Medical): No  ? ? ?SDOH Interventions: ?Financial Resources:  Financial Strain Interventions: Other (Comment) (CAFA; NCMedassist; GoodRx) ?Financial Counseling for [employer] Program  ?Food Insecurity:  Food Insecurity  Interventions: Intervention Not Indicated (pt receives [employer])  ?Housing Insecurity:  Housing Interventions: Intervention Not Indicated  ?Transportation:   Transportation Interventions: Intervention Not Indicated  ? ? ?Other Care Navigation Interventions:    ? ?Provided Pharmacy assistance resources [place] Medassist, Other  ? ?Follow-up plan:   ?Mailed pt information about NCMedassist, reviewed the Navigator consortium with her and mailed a flyer and my card as well. I encouraged her to let me know if any additional questions or concerns arise. I will check in with pt as able. ? ? ? ?

## 2022-01-01 ENCOUNTER — Telehealth (HOSPITAL_BASED_OUTPATIENT_CLINIC_OR_DEPARTMENT_OTHER): Payer: Self-pay

## 2022-01-01 NOTE — Telephone Encounter (Signed)
Received documentation that patient was approved for Xarelto patient assistance through 5/24. Patient also received letter of approval, but RN also sent mychart message ? ?Documents to be given to Gillian Shields, NP upon return to have scanned into chart. Routing to Gillian Shields, NP inbox as [place] Childes only.  ?

## 2022-01-07 ENCOUNTER — Encounter (HOSPITAL_BASED_OUTPATIENT_CLINIC_OR_DEPARTMENT_OTHER): Payer: Self-pay

## 2022-01-07 DIAGNOSIS — Z86711 Personal history of pulmonary embolism: Secondary | ICD-10-CM

## 2022-01-07 DIAGNOSIS — I82409 Acute embolism and thrombosis of unspecified deep veins of unspecified lower extremity: Secondary | ICD-10-CM

## 2022-01-07 MED ORDER — RIVAROXABAN 10 MG PO TABS: ORAL_TABLET | ORAL | 6 refills | Status: DC

## 2022-01-30 ENCOUNTER — Telehealth: Payer: Self-pay | Admitting: Licensed Clinical Social Worker

## 2022-01-30 NOTE — Telephone Encounter (Signed)
LCSW called pt at [phone]; re-introduced self, role, reason for call. Pt shares that she has received applications, she is working on them but has not completed them yet.  She is amenable to me f/u with her next week any day but 6/15. I will f/u on 6/14- remain available before this time as needed. No other questions/concerns at this time.   Octavio Graves, MSW, LCSW Clinical Social Worker II [facility] Navigation  [phone]- work cell phone (preferred) [phone]- desk phone

## 2022-02-07 ENCOUNTER — Telehealth: Payer: Self-pay | Admitting: Licensed Clinical Social Worker

## 2022-02-07 NOTE — Telephone Encounter (Signed)
H&V Care Navigation CSW Progress Note  Clinical Social Worker contacted patient by phone to f/u on assistance applications and answer any additional questions that may have arose. Pt answered at [phone], she confirms she has been working on them but has had a busy week. She will have them completed by her best estimation on Monday. She will call me once they are completed and we can coordinate either submitting them from a Heartcare office or having pt mail them from her home to appropriate addresses. No additional questions/concerns voiced at this time.   Patient is participating in a Managed Medicaid Plan:  No, self-pay/medicaid family planning only  SDOH Screenings   Alcohol Screen: Not on file  Depression (JQB3-4): Not on file  Financial Resource Strain: Medium Risk (12/30/2021)   Overall Financial Resource Strain (CARDIA)    Difficulty of Paying Living Expenses: Somewhat hard  Food Insecurity: No Food Insecurity (12/30/2021)   Hunger Vital Sign    Worried About Running Out of Food in the Last Year: Never true    Ran Out of Food in the Last Year: Never true  Housing: Low Risk  (12/30/2021)   Housing    Last Housing Risk Score: 0  Physical Activity: Not on file  Social Connections: Not on file  Stress: Not on file  Tobacco Use: Low Risk  (12/28/2021)   Patient History    Smoking Tobacco Use: Never    Smokeless Tobacco Use: Never    Passive Exposure: Not on file  Transportation Needs: No Transportation Needs (12/30/2021)   PRAPARE - Transportation    Lack of Transportation (Medical): No    Lack of Transportation (Non-Medical): No     Octavio Graves, MSW, LCSW Clinical Social Worker II [facility] Heart/Vascular Care Navigation  [phone]- work cell phone (preferred) [phone]- desk phone   -

## 2022-02-10 ENCOUNTER — Telehealth: Payer: Self-pay | Admitting: Licensed Clinical Social Worker

## 2022-02-10 NOTE — Telephone Encounter (Signed)
H&V Care Navigation CSW Progress Note  Clinical Social Worker contacted patient by phone to f/u on assistance applications. Pt answered at [phone]. Pt shares she has company, will call me Wednesday. I remain available but will wait for pt call to provide additional assistance at this time.   Patient is participating in a Managed Medicaid Plan:  No, self pay [facility] Family Planning only)  SDOH Screenings   Alcohol Screen: Not on file  Depression (DZH2-9): Not on file  Financial Resource Strain: Medium Risk (12/30/2021)   Overall Financial Resource Strain (CARDIA)    Difficulty of Paying Living Expenses: Somewhat hard  Food Insecurity: No Food Insecurity (12/30/2021)   Hunger Vital Sign    Worried About Running Out of Food in the Last Year: Never true    Ran Out of Food in the Last Year: Never true  Housing: Low Risk  (12/30/2021)   Housing    Last Housing Risk Score: 0  Physical Activity: Not on file  Social Connections: Not on file  Stress: Not on file  Tobacco Use: Low Risk  (12/28/2021)   Patient History    Smoking Tobacco Use: Never    Smokeless Tobacco Use: Never    Passive Exposure: Not on file  Transportation Needs: No Transportation Needs (12/30/2021)   PRAPARE - Transportation    Lack of Transportation (Medical): No    Lack of Transportation (Non-Medical): No   Octavio Graves, MSW, LCSW Clinical Social Worker II [facility] Heart/Vascular Care Navigation  [phone]- work cell phone (preferred) [phone]- desk phone

## 2022-02-12 ENCOUNTER — Other Ambulatory Visit (HOSPITAL_BASED_OUTPATIENT_CLINIC_OR_DEPARTMENT_OTHER): Payer: Self-pay | Admitting: Family

## 2022-02-12 DIAGNOSIS — I10 Essential (primary) hypertension: Secondary | ICD-10-CM

## 2022-02-12 NOTE — Telephone Encounter (Signed)
Rx request sent to pharmacy.  

## 2022-03-07 ENCOUNTER — Telehealth: Payer: Self-pay | Admitting: Licensed Clinical Social Worker

## 2022-03-07 NOTE — Telephone Encounter (Signed)
H&V Care Navigation CSW Progress Note  Clinical Social Worker contacted patient by phone to f/u on assistance applications as pt has not contacted me since early June. Was able to reach her at [phone]. Pt shares she has had family at her home, then was not feeling well and found it hard to concentrate on doing anything due to discomfort. Pt shares that she still does have the applications and will work on them. We agreed to speak next Wednesday 7/19 in the morning and if possible for her to have the paperwork laid out and we can verbally go through it all. Pt agreeable to this, tearful when speaking with this Clinical research associate about how stressed she feels. We discussed that I am here to help and not offended that she hadnt called me- reminded her that I am available to assist during week should pt wish to check in or have any additional questions/concerns.   Patient is participating in a Managed Medicaid Plan:  No, self pay only  SDOH Screenings   Alcohol Screen: Not on file  Depression (OZH0-8): Not on file  Financial Resource Strain: Medium Risk (12/30/2021)   Overall Financial Resource Strain (CARDIA)    Difficulty of Paying Living Expenses: Somewhat hard  Food Insecurity: No Food Insecurity (12/30/2021)   Hunger Vital Sign    Worried About Running Out of Food in the Last Year: Never true    Ran Out of Food in the Last Year: Never true  Housing: Low Risk  (12/30/2021)   Housing    Last Housing Risk Score: 0  Physical Activity: Not on file  Social Connections: Not on file  Stress: Not on file  Tobacco Use: Low Risk  (12/28/2021)   Patient History    Smoking Tobacco Use: Never    Smokeless Tobacco Use: Never    Passive Exposure: Not on file  Transportation Needs: No Transportation Needs (12/30/2021)   PRAPARE - Transportation    Lack of Transportation (Medical): No    Lack of Transportation (Non-Medical): No    Octavio Graves, MSW, LCSW Clinical Social Worker II [facility] Heart/Vascular Care  Navigation  [phone]- work cell phone (preferred) [phone]- desk phone

## 2022-03-11 ENCOUNTER — Telehealth: Payer: Self-pay | Admitting: Licensed Clinical Social Worker

## 2022-03-11 NOTE — Telephone Encounter (Signed)
H&V Care Navigation CSW Progress Note  Clinical Social Worker contacted patient by phone to f/u after voicemail left requesting that I call Friday rather than tomorrow as previously planned. Pt shares she just left doctor and "has an infection." I let pt know that we can speak on Friday and I will make note of that on my calendar. I remain available as needed before that time.   Patient is participating in a Managed Medicaid Plan:  No, self pay only.   SDOH Screenings   Alcohol Screen: Not on file  Depression (SNK5-3): Not on file  Financial Resource Strain: Medium Risk (12/30/2021)   Overall Financial Resource Strain (CARDIA)    Difficulty of Paying Living Expenses: Somewhat hard  Food Insecurity: No Food Insecurity (12/30/2021)   Hunger Vital Sign    Worried About Running Out of Food in the Last Year: Never true    Ran Out of Food in the Last Year: Never true  Housing: Low Risk  (12/30/2021)   Housing    Last Housing Risk Score: 0  Physical Activity: Not on file  Social Connections: Not on file  Stress: Not on file  Tobacco Use: Low Risk  (12/28/2021)   Patient History    Smoking Tobacco Use: Never    Smokeless Tobacco Use: Never    Passive Exposure: Not on file  Transportation Needs: No Transportation Needs (12/30/2021)   PRAPARE - Transportation    Lack of Transportation (Medical): No    Lack of Transportation (Non-Medical): No   Octavio Graves, MSW, LCSW Clinical Social Worker II [facility] Heart/Vascular Care Navigation  [phone]- work cell phone (preferred) [phone]- desk phone

## 2022-03-14 ENCOUNTER — Telehealth: Payer: Self-pay | Admitting: Licensed Clinical Social Worker

## 2022-03-14 ENCOUNTER — Other Ambulatory Visit: Payer: Self-pay

## 2022-03-14 MED ORDER — LUBIPROSTONE 24 MCG PO CAPS
ORAL_CAPSULE | ORAL | 3 refills | Status: AC
  Filled 2022-03-14: qty 60, 30d supply, fill #0

## 2022-03-14 NOTE — Telephone Encounter (Signed)
H&V Care Navigation CSW Progress Note  Clinical Social Worker contacted patient by phone to assist with applications. Was able to reach pt at [phone]. Pt requested to call me back after filling pill boxes. She did do so, we were able to complete [facility] and [facility] while on the phone together, she understands what documents to gather and where to send each. She will have this completed by Monday at her best estimate and will let me know when it is done so I can make note of that completion. She shares one of her medications will be expensive even with GoodRx and inquired if there was a way to get this more affordably. We discussed contacted [facility] to see if it would be more affordable due to her uninsured status- her PCP can send if needed and pt states she could pick up in [place] if needed. No additional questions/concerns at this time. I remain available and continue to follow to assist.   Patient is participating in a Managed Medicaid Plan:  No, self pay only [facility] Family Planning).  SDOH Screenings   Alcohol Screen: Not on file  Depression (RSW5-4): Not on file  Financial Resource Strain: Medium Risk (03/14/2022)   Overall Financial Resource Strain (CARDIA)    Difficulty of Paying Living Expenses: Somewhat hard  Food Insecurity: No Food Insecurity (03/14/2022)   Hunger Vital Sign    Worried About Running Out of Food in the Last Year: Never true    Ran Out of Food in the Last Year: Never true  Housing: Low Risk  (03/14/2022)   Housing    Last Housing Risk Score: 0  Physical Activity: Not on file  Social Connections: Not on file  Stress: Not on file  Tobacco Use: Low Risk  (12/28/2021)   Patient History    Smoking Tobacco Use: Never    Smokeless Tobacco Use: Never    Passive Exposure: Not on file  Transportation Needs: No Transportation Needs (03/14/2022)   PRAPARE - Transportation    Lack of Transportation (Medical): No    Lack of Transportation (Non-Medical): No    Octavio Graves, MSW, LCSW Clinical Social Worker II [facility] Health Heart/Vascular Care Navigation  [phone]- work cell phone (preferred) [phone]- desk phone

## 2022-03-17 ENCOUNTER — Other Ambulatory Visit: Payer: Self-pay

## 2022-03-21 ENCOUNTER — Other Ambulatory Visit: Payer: Self-pay

## 2022-03-24 ENCOUNTER — Telehealth: Payer: Self-pay | Admitting: Licensed Clinical Social Worker

## 2022-03-24 NOTE — Telephone Encounter (Signed)
H&V Care Navigation CSW Progress Note  Clinical Social Worker contacted patient by phone to f/u on assistance applications. Pt had completed applications with my assistance and as of 7/21 needed to collect a few last minute documents to submit. No answer this morning at [phone], voicemail left encouraging her to let me know if any additional questions/concerns or assistance w/ submission needed. I remain available.   Patient is participating in a Managed Medicaid Plan:  No, self pay only (working on [employer])  SDOH Screenings   Alcohol Screen: Not on file  Depression (MAU6-3): Not on file  Financial Resource Strain: Medium Risk (03/14/2022)   Overall Financial Resource Strain (CARDIA)    Difficulty of Paying Living Expenses: Somewhat hard  Food Insecurity: No Food Insecurity (03/14/2022)   Hunger Vital Sign    Worried About Running Out of Food in the Last Year: Never true    Ran Out of Food in the Last Year: Never true  Housing: Low Risk  (03/14/2022)   Housing    Last Housing Risk Score: 0  Physical Activity: Not on file  Social Connections: Not on file  Stress: Not on file  Tobacco Use: Low Risk  (12/28/2021)   Patient History    Smoking Tobacco Use: Never    Smokeless Tobacco Use: Never    Passive Exposure: Not on file  Transportation Needs: No Transportation Needs (03/14/2022)   PRAPARE - Transportation    Lack of Transportation (Medical): No    Lack of Transportation (Non-Medical): No    Octavio Graves, MSW, LCSW Clinical Social Worker II [facility] Health Heart/Vascular Care Navigation  [phone]- work cell phone (preferred) [phone]- desk phone

## 2022-03-27 NOTE — Telephone Encounter (Signed)
H&V Care Navigation CSW Progress Note  Clinical Social Worker  was contacted back by pt via text message  to f/u on my voicemail. She responded the following: "Hey Ms. Ana Baker, I was on the phone when you called. I have mailed the [place] med assist papers and was able to get my one prescription at the community pharmacy for a month for $10 (thank you that helped so much). The other papers will be mailed this week, my sister works Saturday every fourth week and that is this week so during the week she gets off an hour early every day so we can run and get the notarized paperwork done. I will text when it is mailed. Hope you have a great day. Lucille Passy."  I await update that paperwork is mailed. Remain available.   Patient is participating in a Managed Medicaid Plan:  No, self pay only (Medicaid Family Planning)  SDOH Screenings   Alcohol Screen: Not on file  Depression (THY3-8): Not on file  Financial Resource Strain: Medium Risk (03/14/2022)   Overall Financial Resource Strain (CARDIA)    Difficulty of Paying Living Expenses: Somewhat hard  Food Insecurity: No Food Insecurity (03/14/2022)   Hunger Vital Sign    Worried About Running Out of Food in the Last Year: Never true    Ran Out of Food in the Last Year: Never true  Housing: Low Risk  (03/14/2022)   Housing    Last Housing Risk Score: 0  Physical Activity: Not on file  Social Connections: Not on file  Stress: Not on file  Tobacco Use: Low Risk  (12/28/2021)   Patient History    Smoking Tobacco Use: Never    Smokeless Tobacco Use: Never    Passive Exposure: Not on file  Transportation Needs: No Transportation Needs (03/14/2022)   PRAPARE - Transportation    Lack of Transportation (Medical): No    Lack of Transportation (Non-Medical): No   Octavio Graves, MSW, LCSW Clinical Social Worker II [facility] Health Heart/Vascular Care Navigation  [phone]- work cell phone (preferred) [phone]- desk phone

## 2022-04-17 ENCOUNTER — Telehealth: Payer: Self-pay | Admitting: Licensed Clinical Social Worker

## 2022-04-17 DIAGNOSIS — I10 Essential (primary) hypertension: Secondary | ICD-10-CM

## 2022-04-17 MED ORDER — HYDROCHLOROTHIAZIDE 12.5 MG PO CAPS: ORAL_CAPSULE | ORAL | 3 refills | Status: DC

## 2022-04-17 MED ORDER — ATORVASTATIN CALCIUM 40 MG PO TABS: 40.0000 mg | ORAL_TABLET | Freq: Every day | ORAL | 3 refills | Status: DC

## 2022-04-17 NOTE — Addendum Note (Signed)
Addended by: Alver Sorrow on: 04/17/2022 10:04 AM   Modules accepted: Orders

## 2022-04-17 NOTE — Telephone Encounter (Signed)
H&V Care Navigation CSW Progress Note  Clinical Social Worker  contacted [facility]  to f/u on application status. Received update that pt approved for assistance through 02/23/2023. The following medications are available through their program:  Atorvastatin Fluoxetine Hydrochlorothiazide Rivaroxaban Topiramate  I called pt and updated her of the above. She is getting Rivaroxaban (Xarelto) from [facility] at this time. I will request Luther Parody, NP, assist with moving Atorvastatin and Hydrochlorothiazide to [facility] of [address] [place] on [place].   Patient is participating in a Managed Medicaid Plan:  No, self pay.   SDOH Screenings   Alcohol Screen: Not on file  Depression (NWG9-5): Not on file  Financial Resource Strain: Medium Risk (03/14/2022)   Overall Financial Resource Strain (CARDIA)    Difficulty of Paying Living Expenses: Somewhat hard  Food Insecurity: No Food Insecurity (03/14/2022)   Hunger Vital Sign    Worried About Running Out of Food in the Last Year: Never true    Ran Out of Food in the Last Year: Never true  Housing: Low Risk  (03/14/2022)   Housing    Last Housing Risk Score: 0  Physical Activity: Not on file  Social Connections: Not on file  Stress: Not on file  Tobacco Use: Low Risk  (12/28/2021)   Patient History    Smoking Tobacco Use: Never    Smokeless Tobacco Use: Never    Passive Exposure: Not on file  Transportation Needs: No Transportation Needs (03/14/2022)   PRAPARE - Transportation    Lack of Transportation (Medical): No    Lack of Transportation (Non-Medical): No    Octavio Graves, MSW, LCSW Clinical Social Worker II [facility] Health Heart/Vascular Care Navigation  [phone]- work cell phone (preferred) [phone]- desk phone

## 2022-04-17 NOTE — Telephone Encounter (Signed)
Rx sent to [facility]. Thanks for helping her!  Alver Sorrow, NP

## 2022-04-29 ENCOUNTER — Telehealth: Payer: Self-pay | Admitting: Licensed Clinical Social Worker

## 2022-04-29 NOTE — Telephone Encounter (Signed)
H&V Care Navigation CSW Progress Note  Clinical Social Worker contacted patient by phone to ensure she had received her medications from [facility]. Was able to reach her at [phone]. Wished pt a happy belated birthday and inquired about medications. She confirms she received the two from [facility] providers orders. She contacted her PCP office and they said they would fax medication scripts to [facility]. Pt hasnt received those, we discussed her calling [facility] pharmacy tomorrow if still not received to ensure they received scripts. Pt shares she is working on [employer] but still delayed by needed letter of support as her sister still isnt feeling well. Encouraged her if she needs any additional assistance to call me. Pt appreciative for assistance.   Patient is participating in a Managed Medicaid Plan:  No, self pay only. (Family planning, working on CAFA but delayed by family illness)  SDOH Screenings   Food Insecurity: No Food Insecurity (03/14/2022)  Housing: Low Risk  (03/14/2022)  Transportation Needs: No Transportation Needs (03/14/2022)  Financial Resource Strain: Medium Risk (03/14/2022)  Tobacco Use: Low Risk  (12/28/2021)   Octavio Graves, MSW, LCSW Clinical Social Worker II [facility] Health Heart/Vascular Care Navigation  [phone]- work cell phone (preferred) [phone]- desk phone

## 2022-06-17 ENCOUNTER — Telehealth: Payer: Self-pay | Admitting: Licensed Clinical Social Worker

## 2022-06-17 NOTE — Telephone Encounter (Signed)
H&V Care Navigation CSW Progress Note  Clinical Social Worker  was contacted by pt  to update me that her Social Security benefits have been approved and she will start getting Medicare. She has enrolled in a [employer]. She is very pleased and feels the benefits meet her needs. Provided pt with billing department number to have coverage added to her account and I let her know that she should speak with Urban Gibson about moving her medications to [facility] mail order pharmacy during her upcoming appt. No additional questions/concerns at this time. Pt appreciative for team assistance.   Patient is participating in a Managed Medicaid Plan:  No, managed Medicare.   SDOH Screenings   Food Insecurity: No Food Insecurity (03/14/2022)  Housing: Low Risk  (03/14/2022)  Transportation Needs: No Transportation Needs (03/14/2022)  Financial Resource Strain: Low Risk  (06/17/2022)  Tobacco Use: Low Risk  (12/28/2021)    Westley Hummer, MSW, LCSW Clinical Social Worker II [place]  [phone]- work cell phone (preferred) [phone]- desk phone

## 2022-06-24 ENCOUNTER — Ambulatory Visit (HOSPITAL_BASED_OUTPATIENT_CLINIC_OR_DEPARTMENT_OTHER): Payer: Medicaid Other | Admitting: Family

## 2022-06-27 ENCOUNTER — Ambulatory Visit (INDEPENDENT_AMBULATORY_CARE_PROVIDER_SITE_OTHER): Payer: Medicare HMO | Admitting: Family

## 2022-06-27 ENCOUNTER — Other Ambulatory Visit (HOSPITAL_BASED_OUTPATIENT_CLINIC_OR_DEPARTMENT_OTHER): Payer: Self-pay

## 2022-06-27 ENCOUNTER — Encounter (HOSPITAL_BASED_OUTPATIENT_CLINIC_OR_DEPARTMENT_OTHER): Payer: Self-pay | Admitting: Family

## 2022-06-27 VITALS — BP 142/67 | HR 76 | Ht 68.0 in | Wt 298.0 lb

## 2022-06-27 DIAGNOSIS — Z7901 Long term (current) use of anticoagulants: Secondary | ICD-10-CM

## 2022-06-27 DIAGNOSIS — Z86711 Personal history of pulmonary embolism: Secondary | ICD-10-CM

## 2022-06-27 DIAGNOSIS — Z86718 Personal history of other venous thrombosis and embolism: Secondary | ICD-10-CM

## 2022-06-27 DIAGNOSIS — I25118 Atherosclerotic heart disease of native coronary artery with other forms of angina pectoris: Secondary | ICD-10-CM

## 2022-06-27 DIAGNOSIS — E785 Hyperlipidemia, unspecified: Secondary | ICD-10-CM

## 2022-06-27 DIAGNOSIS — I10 Essential (primary) hypertension: Secondary | ICD-10-CM | POA: Diagnosis not present

## 2022-06-27 MED ORDER — HYDROCHLOROTHIAZIDE 12.5 MG PO CAPS: ORAL_CAPSULE | ORAL | 3 refills | Status: DC

## 2022-06-27 MED ORDER — PANTOPRAZOLE SODIUM 40 MG PO TBEC: 40.0000 mg | DELAYED_RELEASE_TABLET | Freq: Two times a day (BID) | ORAL | 3 refills | Status: DC

## 2022-06-27 MED ORDER — FAMOTIDINE 40 MG PO TABS: 40.0000 mg | ORAL_TABLET | Freq: Every day | ORAL | 3 refills | Status: DC

## 2022-06-27 MED ORDER — ATORVASTATIN CALCIUM 40 MG PO TABS: 40.0000 mg | ORAL_TABLET | Freq: Every day | ORAL | 3 refills | Status: DC

## 2022-06-27 MED ORDER — INFLUENZA VAC SPLIT QUAD 0.5 ML IM SUSY
PREFILLED_SYRINGE | INTRAMUSCULAR | 0 refills | Status: AC
  Filled 2022-06-27: qty 0.5, 1d supply, fill #0

## 2022-06-27 MED ORDER — METOPROLOL SUCCINATE ER 25 MG PO TB24: 25.0000 mg | ORAL_TABLET | Freq: Every day | ORAL | 3 refills | Status: DC

## 2022-06-27 MED ORDER — NITROGLYCERIN 0.4 MG SL SUBL: 0.4000 mg | SUBLINGUAL_TABLET | SUBLINGUAL | 2 refills | Status: AC | PRN

## 2022-06-27 NOTE — Progress Notes (Signed)
Office Visit    Patient Name: Ana Baker Date of Encounter: 06/27/2022  PCP:  Gara Kroner, [place]  Cardiologist:  Pixie Casino, MD  Advanced Practice Provider:  No care team member to display Electrophysiologist:  None   Chief Complaint    Ana Baker is a 56 y.o. female presents today for follow up of CAD  Past Medical History    Past Medical History:  Diagnosis Date   Anemia    Anxiety    DDD (degenerative disc disease), lumbar    DDD (degenerative disc disease), lumbar    Depression    Diabetes mellitus without complication ([place])    DVT (deep venous thrombosis) ([place]) 04/2010   and PE, in post-operative setting    Endometriosis    GERD (gastroesophageal reflux disease)    History of kidney stones    History of pulmonary embolism    Hypertension    Idiopathic parathyroidism ([place])    Migraines    OA (osteoarthritis)    Obesity    Osteopenia    Osteoporosis    Pneumonia    x2   PONV (postoperative nausea and vomiting)    Spinal stenosis    Venous reflux    left leg   Vitamin D deficiency    Past Surgical History:  Procedure Laterality Date   ABDOMINAL HYSTERECTOMY  2008   BOWEL RESECTION  03/22/04   sbo   CHOLECYSTECTOMY OPEN  1990   COLONOSCOPY W/ POLYPECTOMY     GASTRIC BYPASS  07/31/2003   DVT post-op - IV filter   HERNIA REPAIR  08/2004   Internal hernia repair   JOINT REPLACEMENT     KIDNEY STONE SURGERY  98,00   KNEE ARTHROSCOPY Left 06,07   KNEE ARTHROSCOPY Left 12/2009   Dr. Theda Sers   LIPOMA EXCISION  03/12/04   right arm   NM MYOCAR PERF WALL MOTION  04/2010   dipyridamole; normal pattern of perfusion to all regions; EF 68%; low risk study    SALPINGOOPHORECTOMY Left    SPINE SURGERY  2009   TOTAL KNEE ARTHROPLASTY Right 82,98,99,00   DVT & PE post-op 1999   TOTAL KNEE ARTHROPLASTY Left 12/09/2019   Procedure: TOTAL KNEE ARTHROPLASTY;  Surgeon: Sydnee Cabal, MD;  Location: [facility];   Service: Orthopedics;  Laterality: Left;  adductor canal   TRANSTHORACIC ECHOCARDIOGRAM  03/2012   EF=>55%, trace MR & TR, normal RSVP    Allergies  Allergies  Allergen Reactions   Almond Oil Anaphylaxis and Rash    ALL ALMONDS!   Nsaids Nausea Only   Adhesive [Tape] Hives and Rash    Burns skin   Penicillins Rash    Did it involve swelling of the face/tongue/throat, SOB, or low BP? No Did it involve sudden or severe rash/hives, skin peeling, or any reaction on the inside of your mouth or nose? Yes Did you need to seek medical attention at a hospital or doctor's office? Was in the hospital when it happend When did it last happen?      21 years ago If all above answers are "NO", may proceed with cephalosporin use.    Tolmetin Nausea Only   Vancomycin Rash    History of Present Illness    Ana Baker is a 56 y.o. female with a hx of  recurrent DVT/PE on Xarelto, spinal stenosis, obesity s/p bariatric surgery, hypertension, CAD last seen 12/27/21. Family history notable for heart  failure in her grandmother and father.  ED visit 03/18/21 for chest pain which was central to right sided and worse with activity. EKG with no acute ST/T wave changes. CXR with mild bronchitic changes. D-dimer and troponin unremarkable.  Seen in follow up 03/20/21 with reports of 1 week history of chest pain radiating from right to left chest described as a pressure. Given hypotension, candesartan-HCTZ was transitioned to HCTZ alone.  Subsequent cardiac CTA 03/28/2021 with coronary calcium score of 185 placing her in the 98th percentile for age, sex, race matched control.  She had mild mixed density plaque 25-49% in the mid LAD and minimal calcified plaque in the mid and distal RCA less than 25%.  Preventative therapy and risk factor modification were recommended.  Rosuvastatin 20 mg daily was initiated.  She was seen 04/15/21 noting episodes of chest discomfort with exertion and dstable dyspnea on exertion. She was  started on low dose Toprol for antianginal benefit. Increased to 25mg  QD with improvement in symptoms. Clinic visit 05/2021 she was seen as she was recuperating from COVID the month prior.  However her dyspnea on exertion continue to improve with metoprolol.  She had sleep study November 2022 with no evidence of obstructive sleep apnea and only mild sleep apnea during REM sleep with no recommendation for CPAP.  Seen 12/27/21 and her Rosuvastatin was transitioned to Atorvastatin due to cost. Xarelto patient assistance was obtained.   She presents today for follow-up.  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 Since last visit she has been involved with social work for 02/26/22. She notes that she no was Medicare A,B,C,D and receiving social security checks. She reports she has no issues affording her medications and doctors visits which she is very relieved. She notes weight gain since last visit due to stress related to insurance coverage. She notes to have chest pressure only in stressful situations and uses deep breathing exercises which resolves the pain. She wants to began working out to lose weight but is worried regarding her knee and back pain.   Reports no shortness of breath nor dyspnea on exertion. Reports no chest pressure, or tightness. No edema, orthopnea, PND. Reports no palpitations.    EKGs/Labs/Other Studies Reviewed:   The following studies were reviewed today:  Cardiac CTA 03/28/21 FINDINGS: Image quality: Excellent.   Noise artifact is: Limited.   Coronary Arteries:  Normal coronary origin.  Right dominance.   Left main: The left main is a large caliber vessel with a normal take off from the left coronary cusp that bifurcates to form a left anterior descending artery and a left circumflex artery. There is no plaque or stenosis.   Left anterior descending artery: The proximal LAD is patent. The mid LAD contains mild mixed  density plaque (25-49%). The distal LAD is hypoplastic and the LAD terminates as a second diagonal branch. The first diagonal is small and patent.   Left circumflex artery: The LCX is non-dominant and patent with no evidence of plaque or stenosis. The LCX gives off 1 patent obtuse marginal branch.   Right coronary artery: The RCA is dominant with normal take off from the right coronary cusp. There is minimal calcified plaque (<25%) in the mid and distal RCA. The RCA terminates as a PDA and right posterolateral branch without evidence of plaque or stenosis.   Right Atrium: Right atrial size is within normal limits.   Right Ventricle: The right ventricular cavity is  within normal limits.   Left Atrium: Left atrial size is normal in size with no left atrial appendage filling defect.   Left Ventricle: The ventricular cavity size is within normal limits. There are no stigmata of prior infarction. There is no abnormal filling defect.   Pulmonary arteries: Normal in size without proximal filling defect.   Pulmonary veins: Normal pulmonary venous drainage.   Pericardium: Normal thickness with no significant effusion or calcium present.   Cardiac valves: The aortic valve is trileaflet without significant calcification. The mitral valve is normal structure without significant calcification.   Aorta: Normal caliber with no significant disease.   Extra-cardiac findings: See attached radiology report for non-cardiac structures.   IMPRESSION: 1. Coronary calcium score of 185. This was 98th percentile for age-, sex, and race-matched controls.   2. Normal coronary origin with right dominance.   3. Mild mixed density plaque (25-49%) in the mid LAD.   4. Minimal calcified plaque in the mid and distal RCA (<25%).   RECOMMENDATIONS: 1. Mild non-obstructive CAD (25-49%). Consider non-atherosclerotic causes of chest pain. Consider preventive therapy and risk  factor modification.  EKG:  EKG not ordered today.  Recent Labs: No results found for requested labs within last 365 days.  Recent Lipid Panel    Component Value Date/Time   CHOL 208 (H) 05/02/2014 0826   TRIG 107 05/02/2014 0826   HDL 67 05/02/2014 0826   CHOLHDL 3.1 05/02/2014 0826   VLDL 21 05/02/2014 0826   LDLCALC 120 (H) 05/02/2014 0826    Home Medications   Current Meds  Medication Sig   calcium carbonate (OSCAL) 1500 (600 Ca) MG TABS tablet Take by mouth 2 (two) times daily with a meal.   cholecalciferol (VITAMIN D3) 25 MCG (1000 UNIT) tablet Take 1,000 Units by mouth daily.   FLUoxetine (PROZAC) 40 MG capsule Take 40 mg daily by mouth.   gabapentin (NEURONTIN) 300 MG capsule Take 300 mg by mouth 3 (three) times daily. Take one tablet 300mg  by mouth in the morning, and in the afternoon, take 2 tablets 600mg  at bedtime.   HYDROcodone-acetaminophen (NORCO) 7.5-325 MG tablet Take 1 tablet by mouth 3 (three) times daily as needed.   l-methylfolate-B6-B12 (METANX) 3-35-2 MG TABS tablet Take 1 tablet by mouth daily.   lubiprostone (AMITIZA) 24 MCG capsule Take 1 capsule (24 mcg total) by mouth 2 times daily with meals.   Methylcobalamin 1 MG CHEW Chew by mouth.   morphine (MS CONTIN) 15 MG 12 hr tablet Take 15 mg by mouth 3 (three) times daily.    Pediatric Multivitamins-Iron (CHILDRENS MULTIVITAMIN/IRON PO) Take 1 tablet by mouth daily. Flinstones   Pyridoxal-5 Phosphate 100 MG/ML SOLN Inject as directed.   rivaroxaban (XARELTO) 10 MG TABS tablet TAKE 1 TABLET (10 MG TOTAL) BY MOUTH DAILY.   SUMAtriptan (IMITREX) 100 MG tablet Take 1 tablet (100 mg total) by mouth every 2 (two) hours as needed for migraine.   topiramate (TOPAMAX) 100 MG tablet Take 100 mg by mouth at bedtime.   [DISCONTINUED] atorvastatin (LIPITOR) 40 MG tablet Take 1 tablet (40 mg total) by mouth daily.   [DISCONTINUED] famotidine (PEPCID) 40 MG tablet Take 40 mg by mouth at bedtime.    [DISCONTINUED]  hydrochlorothiazide (MICROZIDE) 12.5 MG capsule TAKE 1 CAPSULE BY MOUTH EVERY DAY   [DISCONTINUED] metoprolol succinate (TOPROL XL) 25 MG 24 hr tablet Take 1 tablet (25 mg total) by mouth daily.   [DISCONTINUED] pantoprazole (PROTONIX) 40 MG tablet Take 40 mg by mouth  2 (two) times daily.      Review of Systems      All other systems reviewed and are otherwise negative except as noted above.  Physical Exam    VS:  BP (!) 142/67   Pulse 76   Ht 5\' 8"  (1.727 m)   Wt 298 lb (135.2 kg)   BMI 45.31 kg/m  , BMI Body mass index is 45.31 kg/m.  Wt Readings from Last 3 Encounters:  06/27/22 298 lb (135.2 kg)  12/27/21 289 lb 1.6 oz (131.1 kg)  07/10/21 280 lb (127 kg)    GEN: Well nourished, overweight, well developed, in no acute distress. HEENT: normal. Neck: Supple, no JVD, carotid bruits, or masses. Cardiac: RRR, no murmurs, rubs, or gallops. No clubbing, cyanosis, edema.  Radials/PT 2+ and equal bilaterally.  Respiratory:  Respirations regular and unlabored, clear to auscultation bilaterally. GI: Soft, nontender, nondistended. MS: No deformity or atrophy. Skin: Warm and dry, no rash. Neuro:  Strength and sensation are intact. Psych: Normal affect.  Assessment & Plan    Nonobstructive CAD / Aortic atherosclerosis / HLD, LDL goal <70- Cardiac CTA 07/12/21 with moderate nonobstructive plaque in LAD 25 to 49% and minimal atherosclerosis in the RCA less than 25%. Continue risk factor modification. Continue Metoprolol 25mg  QD which has provided antianginal benefit. Heart healthy diet and regular cardiovascular exercise encouraged. Continue Atorvastatin 40mg , no myalgias. Plan for Direct LDL/CMP today.   Spinal Stenosis - Exercise tolerance limited. She has a follow up appointment with pain management next week.   Recurrent DVT/PE on Xarelto - No bleeding complications. With new insurance coverage she is able to afford.   Obesity - Weight loss via diet and exercise encouraged. Discussed  the impact being overweight would have on cardiovascular risk and coronary artery disease. Plan to refer to [facility].  She is driven to start losing weight.   Elevated BP reading: Plan to follow up in x2 weeks over mychart for at home blood pressure readings.    Disposition: Follow up in six months with Dr. 11/4008 or APP  Signed, , NP 06/27/2022, 10:45 AM  [facility]

## 2022-06-27 NOTE — Patient Instructions (Signed)
Medication Instructions:  Your Physician recommend you continue on your current medication as directed.    We have refilled your cardiac medications to the [facility] [place].  *If you need a refill on your cardiac medications before your next appointment, please call your pharmacy*   Lab Work: Your physician recommends that you return for lab work today- Direct LDL, CMP  If you have labs (blood work) drawn today and your tests are completely normal, you will receive your results only by: MyChart Message (if you have MyChart) OR A paper copy in the mail If you have any lab test that is abnormal or we need to change your treatment, we will call you to review the results.  Follow-Up: At [facility], you and your health needs are our priority.  As part of our continuing mission to provide you with exceptional heart care, we have created designated Provider Care Teams.  These Care Teams include your primary Cardiologist (physician) and Advanced Practice Providers (APPs -  Physician Assistants and Nurse Practitioners) who all work together to provide you with the care you need, when you need it.  We recommend signing up for the patient portal called "MyChart".  Sign up information is provided on this After Visit Summary.  MyChart is used to connect with patients for Virtual Visits (Telemedicine).  Patients are able to view lab/test results, encounter notes, upcoming appointments, etc.  Non-urgent messages can be sent to your provider as well.   To learn more about what you can do with MyChart, go to [email or web address].    Your next appointment:   6 month(s)  The format for your next appointment:   In Person  Provider:   Raliegh Ip Mali Hilty, MD or Laurann Montana, NP    Other Instructions Please check your blood pressure daily. Daphene Jaeger will check in with you in about 2 weeks to check on blood pressure.

## 2022-06-28 ENCOUNTER — Encounter (HOSPITAL_BASED_OUTPATIENT_CLINIC_OR_DEPARTMENT_OTHER): Payer: Self-pay | Admitting: Family

## 2022-06-28 LAB — COMPREHENSIVE METABOLIC PANEL
ALT: 61 IU/L — ABNORMAL HIGH (ref 0–32)
AST: 39 IU/L (ref 0–40)
Albumin/Globulin Ratio: 1.6 (ref 1.2–2.2)
Albumin: 4.5 g/dL (ref 3.8–4.9)
Alkaline Phosphatase: 76 IU/L (ref 44–121)
BUN/Creatinine Ratio: 18 (ref 9–23)
BUN: 18 mg/dL (ref 6–24)
Bilirubin Total: 0.5 mg/dL (ref 0.0–1.2)
CO2: 16 mmol/L — ABNORMAL LOW (ref 20–29)
Calcium: 9.5 mg/dL (ref 8.7–10.2)
Chloride: 107 mmol/L — ABNORMAL HIGH (ref 96–106)
Creatinine, Ser: 0.99 mg/dL (ref 0.57–1.00)
Globulin, Total: 2.8 g/dL (ref 1.5–4.5)
Glucose: 140 mg/dL — ABNORMAL HIGH (ref 70–99)
Potassium: 3.8 mmol/L (ref 3.5–5.2)
Sodium: 143 mmol/L (ref 134–144)
Total Protein: 7.3 g/dL (ref 6.0–8.5)
eGFR: 67 mL/min/{1.73_m2} (ref >59)

## 2022-06-28 LAB — LDL CHOLESTEROL, DIRECT: LDL Direct: 64 mg/dL (ref 0–99)

## 2022-06-30 ENCOUNTER — Telehealth (HOSPITAL_BASED_OUTPATIENT_CLINIC_OR_DEPARTMENT_OTHER): Payer: Self-pay

## 2022-06-30 DIAGNOSIS — E785 Hyperlipidemia, unspecified: Secondary | ICD-10-CM

## 2022-06-30 NOTE — Telephone Encounter (Addendum)
Seen by patient Ana Baker on 06/28/2022  9:13 AM; follow up mychart message sent to patient    ----- Message from Loel Dubonnet, NP sent at 06/28/2022  9:12 AM EDT ----- Normal kidney function. One of two liver enzymes elevated. Recommend avoid acetaminophen (tylenol), alcohol, fried foods. LDL (bad cholesterol) at goal.   Recommend repeat liver panel in 1 month for monitoring.

## 2022-07-09 ENCOUNTER — Encounter (HOSPITAL_BASED_OUTPATIENT_CLINIC_OR_DEPARTMENT_OTHER): Payer: Self-pay

## 2022-07-09 DIAGNOSIS — I10 Essential (primary) hypertension: Secondary | ICD-10-CM

## 2022-07-11 ENCOUNTER — Telehealth: Payer: Self-pay

## 2022-07-11 ENCOUNTER — Encounter (HOSPITAL_BASED_OUTPATIENT_CLINIC_OR_DEPARTMENT_OTHER): Payer: Self-pay

## 2022-07-11 NOTE — Telephone Encounter (Signed)
BP log:

## 2022-07-11 NOTE — Telephone Encounter (Signed)
Call to pt reference PREP referral  Explained program. Can do [facility] to start in Jan 1030a class.  Advised will call her in January to set up intake and do initial measurements

## 2022-07-31 ENCOUNTER — Other Ambulatory Visit (HOSPITAL_BASED_OUTPATIENT_CLINIC_OR_DEPARTMENT_OTHER): Payer: Self-pay | Admitting: Family

## 2022-07-31 DIAGNOSIS — I82409 Acute embolism and thrombosis of unspecified deep veins of unspecified lower extremity: Secondary | ICD-10-CM

## 2022-07-31 DIAGNOSIS — Z86711 Personal history of pulmonary embolism: Secondary | ICD-10-CM

## 2022-07-31 DIAGNOSIS — M1991 Primary osteoarthritis, unspecified site: Secondary | ICD-10-CM | POA: Insufficient documentation

## 2022-07-31 NOTE — Telephone Encounter (Signed)
Please review for refill. Thank you! 

## 2022-07-31 NOTE — Telephone Encounter (Signed)
Xarelto 10mg  refill request received. Pt is 56 years old, weight-135.2kg, Crea-0.99 on 06/27/2022, last seen by 13/10/2021 on 06/27/2022, Diagnosis-DVT & PE, CrCl-137.72ml/min; Dose is appropriate based on dosing criteria. Will send in refill to requested pharmacy.

## 2022-08-11 DIAGNOSIS — E785 Hyperlipidemia, unspecified: Secondary | ICD-10-CM | POA: Insufficient documentation

## 2022-08-21 ENCOUNTER — Telehealth: Payer: Self-pay

## 2022-08-21 NOTE — Telephone Encounter (Signed)
Call placed to pt reference PREP class starting on 09/01/22 at [id number] MW at [facility]. Confirmed interest. Will miss 09/03/22 due to MD appt for injections to knees.  Intake scheduled for 1/5 at 10a. Will meet her at the front desk at [facility]

## 2022-08-26 ENCOUNTER — Telehealth: Payer: Self-pay

## 2022-08-26 NOTE — Telephone Encounter (Signed)
Call to pt needing to change intake to 09/01/22 and PREP class change to 09/08/22. Agreeable to making changes.

## 2022-09-01 ENCOUNTER — Encounter (HOSPITAL_BASED_OUTPATIENT_CLINIC_OR_DEPARTMENT_OTHER): Payer: Self-pay

## 2022-09-01 NOTE — Progress Notes (Signed)
[facility] PREP Evaluation  Patient Details  Name: Ana Baker MRN: [id number] Date of Birth: September 08, 1965 Age: 57 y.o. PCP: Gara Kroner, DO  Vitals:   09/01/22 1052  BP: 118/68  Pulse: 80  SpO2: 98%  Weight: 294 lb 6.4 oz (133.5 kg)     [facility] Eval - 09/01/22 1000       [facility] "PREP" Location   [facility] "PREP" Location [facility]      Referral    Referring Provider Gilford Rile    Reason for referral Inactivity;Obesitity/Overweight    Program Start Date 09/08/22   MW [id number] x 12 wks     Measurement   Waist Circumference 52.5 inches    Hip Circumference 58 inches    Body fat --   Would not register     Information for Trainer   Goals Lose 24 lbs, decrease pain, avoid insulin by lowering A1C    Current Exercise none    Orthopedic Concerns Bil knees TKR, Spinal Stenosis, osteoprosis, hx of T12 fracture    Pertinent Medical History Hx of DVT, HX of PE/clotting disorder, chronic pain, kidney stones, CAD    Current Barriers none    Medications that affect exercise Beta blocker;Medication causing dizziness/drowsiness      Timed Up and Go (TUGS)   Timed Up and Go Low risk <9 seconds      Mobility and Daily Activities   I find it easy to walk up or down two or more flights of stairs. 1    I have no trouble taking out the trash. 4    I do housework such as vacuuming and dusting on my own without difficulty. 2    I can easily lift a gallon of milk (8lbs). 4    I can easily walk a mile. 1    I have no trouble reaching into high cupboards or reaching down to pick up something from the floor. 2    I do not have trouble doing out-door work such as Armed forces logistics/support/administrative officer, raking leaves, or gardening. 1      Mobility and Daily Activities   I feel younger than my age. 4    I feel independent. 2    I feel energetic. 1    I live an active life.  1    I feel strong. 1    I feel healthy. 1    I feel active as other people my age. 1      How fit and strong are you.   Fit and Strong  Total Score 26            Past Medical History:  Diagnosis Date   Anemia    Anxiety    DDD (degenerative disc disease), lumbar    DDD (degenerative disc disease), lumbar    Depression    Diabetes mellitus without complication (HCC)    DVT (deep venous thrombosis) ([place]) 04/2010   and PE, in post-operative setting    Endometriosis    GERD (gastroesophageal reflux disease)    History of kidney stones    History of pulmonary embolism    Hypertension    Idiopathic parathyroidism (HCC)    Migraines    OA (osteoarthritis)    Obesity    Osteopenia    Osteoporosis    Pneumonia    x2   PONV (postoperative nausea and vomiting)    Spinal stenosis    Venous reflux    left leg   Vitamin D  deficiency    Past Surgical History:  Procedure Laterality Date   ABDOMINAL HYSTERECTOMY  2008   BOWEL RESECTION  03/22/04   sbo   CHOLECYSTECTOMY OPEN  1990   COLONOSCOPY W/ POLYPECTOMY     GASTRIC BYPASS  07/31/2003   DVT post-op - IV filter   HERNIA REPAIR  08/2004   Internal hernia repair   JOINT REPLACEMENT     KIDNEY STONE SURGERY  98,00   KNEE ARTHROSCOPY Left 06,07   KNEE ARTHROSCOPY Left 12/2009   Dr. Thomasena Edis   LIPOMA EXCISION  03/12/04   right arm   NM MYOCAR PERF WALL MOTION  04/2010   dipyridamole; normal pattern of perfusion to all regions; EF 68%; low risk study    SALPINGOOPHORECTOMY Left    SPINE SURGERY  2009   TOTAL KNEE ARTHROPLASTY Right 82,98,99,00   DVT & PE post-op 1999   TOTAL KNEE ARTHROPLASTY Left 12/09/2019   Procedure: TOTAL KNEE ARTHROPLASTY;  Surgeon: Eugenia Mcalpine, MD;  Location: [facility];  Service: Orthopedics;  Laterality: Left;  adductor canal   TRANSTHORACIC ECHOCARDIOGRAM  03/2012   EF=>55%, trace MR & TR, normal RSVP   Social History   Tobacco Use  Smoking Status Never  Smokeless Tobacco Never    Bonnye Fava 09/01/2022, 10:57 AM

## 2022-09-01 NOTE — Telephone Encounter (Signed)
Send patient lab results to MD when we get them, fax number incoming

## 2022-09-02 DIAGNOSIS — M79641 Pain in right hand: Secondary | ICD-10-CM | POA: Insufficient documentation

## 2022-09-02 LAB — HEPATIC FUNCTION PANEL
ALT: 46 IU/L — ABNORMAL HIGH (ref 0–32)
AST: 26 IU/L (ref 0–40)
Albumin: 4.1 g/dL (ref 3.8–4.9)
Alkaline Phosphatase: 81 IU/L (ref 44–121)
Bilirubin Total: 0.4 mg/dL (ref 0.0–1.2)
Bilirubin, Direct: 0.16 mg/dL (ref 0.00–0.40)
Total Protein: 6.5 g/dL (ref 6.0–8.5)

## 2022-09-11 NOTE — Progress Notes (Signed)
[facility] PREP Weekly Session  Patient Details  Name: Ana Baker MRN: [id number] Date of Birth: [birth year] Age: 57 y.o. PCP: Gara Kroner, DO  There were no vitals filed for this visit.   [facility] Weekly seesion - 09/11/22 1100       [facility] "PREP" Location   [facility] "PREP" [facility]      Weekly Session   Topic Discussed Goal setting and welcome to the program   Scale of percevied exertion, fit testing   Classes attended to date 2             [name] 09/11/2022, 11:41 AM

## 2022-09-15 NOTE — Progress Notes (Signed)
[facility] PREP Weekly Session  Patient Details  Name: Ana Baker MRN: [id number] Date of Birth: [birth year] Age: 57 y.o. PCP: Gara Kroner, DO  Vitals:   09/15/22 1030  Weight: 294 lb 12.8 oz (133.7 kg)     [facility] Weekly seesion - 09/15/22 1200       [facility] "PREP" Location   [facility] "PREP" Location [facility]      Weekly Session   Topic Discussed Importance of resistance training;Other ways to be active    Minutes exercised this week 35 minutes    Classes attended to date [place] 09/15/2022, 12:38 PM

## 2022-09-24 ENCOUNTER — Telehealth (HOSPITAL_BASED_OUTPATIENT_CLINIC_OR_DEPARTMENT_OTHER): Payer: Self-pay | Admitting: *Deleted

## 2022-09-24 ENCOUNTER — Telehealth: Payer: Self-pay

## 2022-09-24 NOTE — Telephone Encounter (Signed)
   Pre-operative Risk Assessment    Patient Name: Ana Baker  DOB: 1965-09-12 MRN: [id number]      Request for Surgical Clearance    Procedure:   Colonoscopy  Date of Surgery:  Clearance TBD                             Surgeon:  Dr Kirtland Bouchard Group or Practice Name:  [facility] Phone number:  [phone] Fax number:  [phone]   Type of Clearance Requested:   - Medical  - Pharmacy:  Hold Rivaroxaban (Xarelto) for 2 days   Type of Anesthesia:  Not Indicated   Additional requests/questions:    Rocco Pauls   09/24/2022, 11:49 AM

## 2022-09-24 NOTE — Telephone Encounter (Signed)
Spoke with patient who is agreeable to do a tele visit on 2/8 at 2pm. Mec rec and consent are done.

## 2022-09-24 NOTE — Telephone Encounter (Signed)
Primary Cardiologist:Kenneth C Hilty, MD   Preoperative team, please contact this patient and set up a phone call appointment for further preoperative risk assessment. Please obtain consent and complete medication review. Thank you for your help.   I confirm that guidance regarding antiplatelet and oral anticoagulation therapy has been completed and, if necessary, noted below.   Emmaline Life, NP-C  09/24/2022, 1:30 PM [address] Office [phone] Fax [phone]

## 2022-09-24 NOTE — Telephone Encounter (Signed)
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    Procedure: colonoscopy Date of procedure: TBD  CrCl 49mL/min using adjusted body weight Platelet count 226K  Prior notes from heme/onc - Dr Lindi Adie:  Regarding DVT and PE  1999: DVT and PE treated with warfarin, had kidney stones and no significant bleeding 2011: Recurrent DVT: Switched from warfarin to Xarelto Obesity, chronic back problems requires injections Apparently previous workup for hypercoagulability was negative.   Risk factors for recurrent clots: Obesity, decreased mobility Recommendation: Lifelong anticoagulation When she undergoes procedures she needs to stop Xarelto for 48 hours prior to the procedure. She can resume it the day after the procedure.   Ok to hold Xarelto for 2 days prior to procedure as requested.  **This guidance is not considered finalized until pre-operative APP has relayed final recommendations.**

## 2022-09-24 NOTE — Telephone Encounter (Signed)
  Patient Consent for Virtual Visit        Ana Baker has provided verbal consent on 09/24/2022 for a virtual visit (video or telephone).   CONSENT FOR VIRTUAL VISIT FOR:  Ana Baker  By participating in this virtual visit I agree to the following:  I hereby voluntarily request, consent and authorize [facility] and its employed or contracted physicians, physician assistants, nurse practitioners or other licensed health care professionals (the Practitioner), to provide me with telemedicine health care services (the "Services") as deemed necessary by the treating Practitioner. I acknowledge and consent to receive the Services by the Practitioner via telemedicine. I understand that the telemedicine visit will involve communicating with the Practitioner through live audiovisual communication technology and the disclosure of certain medical information by electronic transmission. I acknowledge that I have been given the opportunity to request an in-person assessment or other available alternative prior to the telemedicine visit and am voluntarily participating in the telemedicine visit.  I understand that I have the right to withhold or withdraw my consent to the use of telemedicine in the course of my care at any time, without affecting my right to future care or treatment, and that the Practitioner or I may terminate the telemedicine visit at any time. I understand that I have the right to inspect all information obtained and/or recorded in the course of the telemedicine visit and may receive copies of available information for a reasonable fee.  I understand that some of the potential risks of receiving the Services via telemedicine include:  Delay or interruption in medical evaluation due to technological equipment failure or disruption; Information transmitted may not be sufficient (e.g. poor resolution of images) to allow for appropriate medical decision making by the Practitioner;  and/or  In rare instances, security protocols could fail, causing a breach of personal health information.  Furthermore, I acknowledge that it is my responsibility to provide information about my medical history, conditions and care that is complete and accurate to the best of my ability. I acknowledge that Practitioner's advice, recommendations, and/or decision may be based on factors not within their control, such as incomplete or inaccurate data provided by me or distortions of diagnostic images or specimens that may result from electronic transmissions. I understand that the practice of medicine is not an exact science and that Practitioner makes no warranties or guarantees regarding treatment outcomes. I acknowledge that a copy of this consent can be made available to me via my patient portal ([facility]), or I can request a printed copy by calling the office of [facility].    I understand that my insurance will be billed for this visit.   I have read or had this consent read to me. I understand the contents of this consent, which adequately explains the benefits and risks of the Services being provided via telemedicine.  I have been provided ample opportunity to ask questions regarding this consent and the Services and have had my questions answered to my satisfaction. I give my informed consent for the services to be provided through the use of telemedicine in my medical care

## 2022-09-25 NOTE — Progress Notes (Signed)
[facility] PREP Weekly Session  Patient Details  Name: Ana Baker MRN: [id number] Date of Birth: 21-Mar-1966 Age: 57 y.o. PCP: Gara Kroner, DO  Vitals:   09/22/22 1030  Weight: 291 lb 9.6 oz (132.3 kg)     [facility] Weekly seesion - 09/25/22 1700       [facility] "PREP" Location   [facility]PREP" [facility]      Weekly Session   Topic Discussed Healthy eating tips    Minutes exercised this week 52 minutes    Classes attended to date [place] 09/25/2022, 5:46 PM

## 2022-09-29 NOTE — Progress Notes (Signed)
[facility] PREP Weekly Session  Patient Details  Name: Ana Baker MRN: [id number] Date of Birth: 22-Dec-1965 Age: 57 y.o. PCP: Gara Kroner, DO  Vitals:   09/29/22 1300  Weight: 292 lb 6.4 oz (132.6 kg)     [facility] Weekly seesion - 09/29/22 1500       [facility] "PREP" Location   [facility] "PREP" Location [facility]      Weekly Session   Topic Discussed Health habits    Minutes exercised this week 0 minutes   back pain   Classes attended to date [name] 09/29/2022, 3:34 PM

## 2022-10-01 NOTE — Progress Notes (Unsigned)
Virtual Visit via Telephone Note   Because of Ana Baker's co-morbid illnesses, she is at least at moderate risk for complications without adequate follow up.  This format is felt to be most appropriate for this patient at this time.  The patient did not have access to video technology/had technical difficulties with video requiring transitioning to audio format only (telephone).  All issues noted in this document were discussed and addressed.  No physical exam could be performed with this format.  Please refer to the patient's chart for her consent to telehealth for [facility].  Evaluation Performed:  Preoperative cardiovascular risk assessment _____________   Date:  10/02/2022   Patient ID:  Ana Baker, DOB 1966-06-09, MRN [id number] Patient Location:  Home Provider location:   Office  Primary Care Provider:  Gara Kroner, DO Primary Cardiologist:  Pixie Casino, MD  Chief Complaint / Patient Profile   57 y.o. y/o female with a h/o recurrent DVT/PE on Xarelto, spinal stenosis, obesity s/p bariatric surgery, hypertension, CAD  who is pending colonoscopy and presents today for telephonic preoperative cardiovascular risk assessment.  History of Present Illness    Ana Baker is a 57 y.o. female who presents via audio/video conferencing for a telehealth visit today.  Pt was last seen in cardiology clinic on 06/27/2022 by Laurann Montana, NP.  At that time Ana Baker was doing well with chest pressure occurring only in stressful situations but no new cardiac complaints.  The patient is now pending procedure as outlined above. Since her last visit, she is doing well and currently participating in the PREP program at the [facility].  She reports that she is enjoying learning about nutrition and exercises geared towards her particular health needs.   She denies chest pain, shortness of breath, lower extremity edema, fatigue, palpitations, melena, hematuria, hemoptysis,  diaphoresis, weakness, presyncope, syncope, orthopnea, and PND.   Ok to hold Xarelto for 2 days prior to procedure    Past Medical History    Past Medical History:  Diagnosis Date   Anemia    Anxiety    DDD (degenerative disc disease), lumbar    DDD (degenerative disc disease), lumbar    Depression    Diabetes mellitus without complication ([place])    DVT (deep venous thrombosis) ([place]) 04/2010   and PE, in post-operative setting    Endometriosis    GERD (gastroesophageal reflux disease)    History of kidney stones    History of pulmonary embolism    Hypertension    Idiopathic parathyroidism (HCC)    Migraines    OA (osteoarthritis)    Obesity    Osteopenia    Osteoporosis    Pneumonia    x2   PONV (postoperative nausea and vomiting)    Spinal stenosis    Venous reflux    left leg   Vitamin D deficiency    Past Surgical History:  Procedure Laterality Date   ABDOMINAL HYSTERECTOMY  2008   BOWEL RESECTION  03/22/04   sbo   CHOLECYSTECTOMY OPEN  1990   COLONOSCOPY W/ POLYPECTOMY     GASTRIC BYPASS  07/31/2003   DVT post-op - IV filter   HERNIA REPAIR  08/2004   Internal hernia repair   JOINT REPLACEMENT     KIDNEY STONE SURGERY  98,00   KNEE ARTHROSCOPY Left 06,07   KNEE ARTHROSCOPY Left 12/2009   Dr. Theda Sers   LIPOMA EXCISION  03/12/04   right arm   NM  MYOCAR PERF WALL MOTION  04/2010   dipyridamole; normal pattern of perfusion to all regions; EF 68%; low risk study    SALPINGOOPHORECTOMY Left    SPINE SURGERY  2009   TOTAL KNEE ARTHROPLASTY Right 82,98,99,00   DVT & PE post-op 1999   TOTAL KNEE ARTHROPLASTY Left 12/09/2019   Procedure: TOTAL KNEE ARTHROPLASTY;  Surgeon: Sydnee Cabal, MD;  Location: [facility];  Service: Orthopedics;  Laterality: Left;  adductor canal   TRANSTHORACIC ECHOCARDIOGRAM  03/2012   EF=>55%, trace MR & TR, normal RSVP    Allergies  Allergies  Allergen Reactions   Almond Oil Anaphylaxis and Rash    ALL ALMONDS!   Nsaids Nausea Only    Adhesive [Tape] Hives and Rash    Burns skin   Penicillins Rash    Did it involve swelling of the face/tongue/throat, SOB, or low BP? No Did it involve sudden or severe rash/hives, skin peeling, or any reaction on the inside of your mouth or nose? Yes Did you need to seek medical attention at a hospital or doctor's office? Was in the hospital when it happend When did it last happen?      21 years ago If all above answers are "NO", may proceed with cephalosporin use.    Tolmetin Nausea Only   Vancomycin Rash    Home Medications    Prior to Admission medications   Medication Sig Start Date End Date Taking? Authorizing Provider  atorvastatin (LIPITOR) 40 MG tablet Take 1 tablet (40 mg total) by mouth daily. 06/27/22 06/22/23  Loel Dubonnet, NP  calcium carbonate (OSCAL) 1500 (600 Ca) MG TABS tablet Take by mouth 2 (two) times daily with a meal.    [provider]  cholecalciferol (VITAMIN D3) 25 MCG (1000 UNIT) tablet Take 1,000 Units by mouth daily.    [provider]  famotidine (PEPCID) 40 MG tablet Take 1 tablet (40 mg total) by mouth at bedtime. 06/27/22   Loel Dubonnet, NP  FLUoxetine (PROZAC) 40 MG capsule Take 40 mg daily by mouth.    [provider]  gabapentin (NEURONTIN) 300 MG capsule Take 300 mg by mouth 3 (three) times daily. Take one tablet 300mg  by mouth in the morning, and in the afternoon, take 2 tablets 600mg  at bedtime.    [provider]  hydrochlorothiazide (MICROZIDE) 12.5 MG capsule TAKE 1 CAPSULE BY MOUTH EVERY DAY 06/27/22   Loel Dubonnet, NP  HYDROcodone-acetaminophen (NORCO) 7.5-325 MG tablet Take 1 tablet by mouth 3 (three) times daily as needed. 11/13/21   [provider]  influenza vac split quadrivalent PF (FLUARIX) 0.5 ML injection Inject into the muscle. 06/27/22   Carlyle Basques, MD  l-methylfolate-B6-B12 (METANX) 3-35-2 MG TABS tablet Take 1 tablet by mouth daily.    [provider]   lubiprostone (AMITIZA) 24 MCG capsule Take 1 capsule (24 mcg total) by mouth 2 times daily with meals. 03/14/22     Methylcobalamin 1 MG CHEW Chew by mouth.    [provider]  metoprolol succinate (TOPROL XL) 25 MG 24 hr tablet Take 1 tablet (25 mg total) by mouth daily. 06/27/22   Loel Dubonnet, NP  morphine (MS CONTIN) 15 MG 12 hr tablet Take 15 mg by mouth 3 (three) times daily.  09/14/13   [provider]  nitroGLYCERIN (NITROSTAT) 0.4 MG SL tablet Place 1 tablet (0.4 mg total) under the tongue every 5 (five) minutes as needed for chest pain. 06/27/22 09/25/22  Laurann Montana  S, NP  pantoprazole (PROTONIX) 40 MG tablet Take 1 tablet (40 mg total) by mouth 2 (two) times daily. 06/27/22   Loel Dubonnet, NP  Pediatric Multivitamins-Iron (CHILDRENS MULTIVITAMIN/IRON PO) Take 1 tablet by mouth daily. Flinstones    [provider]  Pyridoxal-5 Phosphate 100 MG/ML SOLN Inject as directed.    [provider]  rivaroxaban (XARELTO) 10 MG TABS tablet TAKE 1 TABLET BY MOUTH EVERY DAY 07/31/22   Loel Dubonnet, NP  SUMAtriptan (IMITREX) 100 MG tablet Take 1 tablet (100 mg total) by mouth every 2 (two) hours as needed for migraine. 10/27/13   Jonathon Resides, MD  topiramate (TOPAMAX) 100 MG tablet Take 100 mg by mouth at bedtime. 09/17/19   [provider]    Physical Exam    Vital Signs:  Ana Baker does not have vital signs available for review today.  Given telephonic nature of communication, physical exam is limited. AAOx3. NAD. Normal affect.  Speech and respirations are unlabored.  Accessory Clinical Findings    None  Assessment & Plan    1.  Preoperative Cardiovascular Risk Assessment: The patient affirms she has been doing well without any new cardiac symptoms. They are able to achieve 5 METS without cardiac limitations. Therefore, based on ACC/AHA guidelines, the patient would be at acceptable risk for the planned procedure without  further cardiovascular testing. The patient was advised that if she develops new symptoms prior to surgery to contact our office to arrange for a follow-up visit, and she verbalized understanding.   Ms. Deluna perioperative risk of a major cardiac event is 0.4% according to the Revised Cardiac Risk Index (RCRI).  Therefore, she is at low risk for perioperative complications.   Her functional capacity is good at 5.07 METs according to the Duke Activity Status Index (DASI). Recommendations: According to ACC/AHA guidelines, no further cardiovascular testing needed.  The patient may proceed to surgery at acceptable risk.   Antiplatelet and/or Anticoagulation Recommendations:   Xarelto (Rivaroxaban) can be held for 2 days prior to surgery.  Please resume post op when felt to be safe.      The patient was advised that if she develops new symptoms prior to surgery to contact our office to arrange for a follow-up visit, and she verbalized understanding.    Time:   Today, I have spent 7 minutes with the patient with telehealth technology discussing medical history, symptoms, and management plan.     Mable Fill, Marissa Nestle, NP  10/02/2022, 2:08 PM

## 2022-10-02 ENCOUNTER — Ambulatory Visit: Payer: Medicare HMO | Attending: Cardiology

## 2022-10-02 DIAGNOSIS — Z01810 Encounter for preprocedural cardiovascular examination: Secondary | ICD-10-CM

## 2022-10-06 NOTE — Progress Notes (Signed)
[facility] PRE[facility] Weekly Session  Patient Details  Name: Ana Baker MRN: [id number] Date of Birth: 09/20/1965 Age: 57 y.o. PCP: Gara Kroner, DO  Vitals:   10/06/22 1030  Weight: 289 lb (131.1 kg)     [facility] Weekly seesion - 10/06/22 1200       [facility] "PREP" Location   [facility] "PREP" [facility]      Weekly Session   Topic Discussed Restaurant Eating   salt and sugar demos   Minutes exercised this week 70 minutes    Classes attended to date [place] 10/06/2022, 12:26 PM

## 2022-10-15 NOTE — Progress Notes (Signed)
[facility] PRE[facility] Weekly Session  Patient Details  Name: Ana Baker MRN: [id number] Date of Birth: 09/25/1965 Age: 57 y.o. PCP: Gara Kroner, DO  Vitals:   10/13/22 0942  Weight: 289 lb 6.4 oz (131.3 kg)     [facility] Weekly seesion - 10/15/22 0900       [facility] "PREP" Location   [facility]PREP[facility] Location [facility]      Weekly Session   Topic Discussed Stress management and problem solving   relaxation meditation   Minutes exercised this week 101 minutes    Classes attended to date [place] 10/15/2022, 9:43 AM

## 2022-11-05 NOTE — Progress Notes (Signed)
[facility] PREP Weekly Session  Patient Details  Name: Ana Baker MRN: [id number] Date of Birth: 25-Oct-1965 Age: 58 y.o. PCP: Gara Kroner, DO  Vitals:   11/03/22 1030  Weight: 285 lb 3.2 oz (129.4 kg)     [facility] Weekly seesion - 11/05/22 1200       [facility] "PREP" Location   [facility]PREP[facility] Location [facility]      Weekly Session   Topic Discussed Finding support    Minutes exercised this week 122 minutes    Classes attended to date 15             [name] 11/05/2022, 12:33 PM

## 2022-11-12 NOTE — Progress Notes (Signed)
[facility] PREP Weekly Session  Patient Details  Name: Ana Baker MRN: [id number] Date of Birth: 03-30-1966 Age: 57 y.o. PCP: Gara Kroner, DO  Vitals:   11/10/22 1030  Weight: 288 lb 6.4 oz (130.8 kg)     [facility] Weekly seesion - 11/12/22 0900       [facility] "PREP" Location   [facility] "PREP[facility] Location [facility]      Weekly Session   Topic Discussed Calorie breakdown    Minutes exercised this week 75 minutes    Classes attended to date 16             [name] 11/12/2022, 9:39 AM

## 2022-11-27 NOTE — Progress Notes (Signed)
[facility] PREP Evaluation  Patient Details  Name: Ana Baker MRN: [id number] Date of Birth: June 11, 1966 Age: 57 y.o. PCP: Gara Kroner, DO  Vitals:   11/26/22 0930  BP: 98/62  Pulse: 81  SpO2: 96%  Weight: 284 lb (128.8 kg)     [facility] Eval - 11/27/22 1600       [facility] "PREP" Location   [facility] Location [facility]      Referral    Program Start Date 09/08/22    Program End Date 11/26/22      Measurement   Waist Circumference End Program 51.5 inches    Hip Circumference End Program 58 inches    Body fat --   UTC     Information for Trainer   Goals Reset      Mobility and Daily Activities   I find it easy to walk up or down two or more flights of stairs. 2    I have no trouble taking out the trash. 4    I do housework such as vacuuming and dusting on my own without difficulty. 2    I can easily lift a gallon of milk (8lbs). 4    I can easily walk a mile. 3    I have no trouble reaching into high cupboards or reaching down to pick up something from the floor. 4    I do not have trouble doing out-door work such as Armed forces logistics/support/administrative officer, raking leaves, or gardening. 1      Mobility and Daily Activities   I feel younger than my age. 3    I feel independent. 4    I feel energetic. 3    I live an active life.  2    I feel strong. 3    I feel healthy. 3    I feel active as other people my age. 2      How fit and strong are you.   Fit and Strong Total Score 40            Past Medical History:  Diagnosis Date   Anemia    Anxiety    DDD (degenerative disc disease), lumbar    DDD (degenerative disc disease), lumbar    Depression    Diabetes mellitus without complication (HCC)    DVT (deep venous thrombosis) ([place]) 04/2010   and PE, in post-operative setting    Endometriosis    GERD (gastroesophageal reflux disease)    History of kidney stones    History of pulmonary embolism    Hypertension    Idiopathic parathyroidism (HCC)    Migraines    OA  (osteoarthritis)    Obesity    Osteopenia    Osteoporosis    Pneumonia    x2   PONV (postoperative nausea and vomiting)    Spinal stenosis    Venous reflux    left leg   Vitamin D deficiency    Past Surgical History:  Procedure Laterality Date   ABDOMINAL HYSTERECTOMY  2008   BOWEL RESECTION  03/22/04   sbo   CHOLECYSTECTOMY OPEN  1990   COLONOSCOPY W/ POLYPECTOMY     GASTRIC BYPASS  07/31/2003   DVT post-op - IV filter   HERNIA REPAIR  08/2004   Internal hernia repair   JOINT REPLACEMENT     KIDNEY STONE SURGERY  98,00   KNEE ARTHROSCOPY Left 06,07   KNEE ARTHROSCOPY Left 12/2009   Dr. Theda Sers   LIPOMA EXCISION  03/12/04   right arm   NM MYOCAR PERF WALL MOTION  04/2010   dipyridamole; normal pattern of perfusion to all regions; EF 68%; low risk study    SALPINGOOPHORECTOMY Left    SPINE SURGERY  2009   TOTAL KNEE ARTHROPLASTY Right 82,98,99,00   DVT & PE post-op 1999   TOTAL KNEE ARTHROPLASTY Left 12/09/2019   Procedure: TOTAL KNEE ARTHROPLASTY;  Surgeon: Sydnee Cabal, MD;  Location: [facility];  Service: Orthopedics;  Laterality: Left;  adductor canal   TRANSTHORACIC ECHOCARDIOGRAM  03/2012   EF=>55%, trace MR & TR, normal RSVP   Social History   Tobacco Use  Smoking Status Never  Smokeless Tobacco Never   Attended >18 workouts, >9 educational topics Fit testing: Cardio march: 212 to 272 Sit to stand: 7 to 10 Bicep curl: 12 to 20 Balance improved.  Encouraged cont to exercise in light of upcoming surgery to be in the best condition as possible.  [name] 11/27/2022, 4:25 PM

## 2022-11-28 ENCOUNTER — Telehealth: Payer: Self-pay | Admitting: Internal Medicine

## 2022-11-28 NOTE — Telephone Encounter (Signed)
   Pre-operative Risk Assessment    Patient Name: Ana Baker  DOB: May 02, 1966 MRN: [id number]      Request for Surgical Clearance    Procedure:   Robotic right Hemicolectomy  Date of Surgery:  Clearance 01/06/23                                 Surgeon:  Dr. Wyn Quaker Surgeon's Group or Practice Name:  [facility] Phone number:  [phone] Fax number:  [phone]   Type of Clearance Requested:   - Pharmacy:  Hold Rivaroxaban (Xarelto) Or Lovenox Bridge   Type of Anesthesia:  General    Additional requests/questions:  Please advise surgeon/provider what medications should be held.  Signed, Belisicia T Woods   11/28/2022, 2:51 PM

## 2022-12-01 ENCOUNTER — Telehealth: Payer: Self-pay | Admitting: *Deleted

## 2022-12-01 NOTE — Telephone Encounter (Signed)
Pt has been scheduled for tele pre op appt 12/08/22 @ 2:40. Med rec and consent are done.

## 2022-12-01 NOTE — Telephone Encounter (Signed)
Pt has been scheduled for tele pre op appt 12/08/22 @ 2:40. Med rec and consent are done.     Patient Consent for Virtual Visit        Ana Baker has provided verbal consent on 12/01/2022 for a virtual visit (video or telephone).   CONSENT FOR VIRTUAL VISIT FOR:  Ana Baker  By participating in this virtual visit I agree to the following:  I hereby voluntarily request, consent and authorize [facility] and its employed or contracted physicians, physician assistants, nurse practitioners or other licensed health care professionals (the Practitioner), to provide me with telemedicine health care services (the "Services") as deemed necessary by the treating Practitioner. I acknowledge and consent to receive the Services by the Practitioner via telemedicine. I understand that the telemedicine visit will involve communicating with the Practitioner through live audiovisual communication technology and the disclosure of certain medical information by electronic transmission. I acknowledge that I have been given the opportunity to request an in-person assessment or other available alternative prior to the telemedicine visit and am voluntarily participating in the telemedicine visit.  I understand that I have the right to withhold or withdraw my consent to the use of telemedicine in the course of my care at any time, without affecting my right to future care or treatment, and that the Practitioner or I may terminate the telemedicine visit at any time. I understand that I have the right to inspect all information obtained and/or recorded in the course of the telemedicine visit and may receive copies of available information for a reasonable fee.  I understand that some of the potential risks of receiving the Services via telemedicine include:  Delay or interruption in medical evaluation due to technological equipment failure or disruption; Information transmitted may not be sufficient (e.g. poor  resolution of images) to allow for appropriate medical decision making by the Practitioner; and/or  In rare instances, security protocols could fail, causing a breach of personal health information.  Furthermore, I acknowledge that it is my responsibility to provide information about my medical history, conditions and care that is complete and accurate to the best of my ability. I acknowledge that Practitioner's advice, recommendations, and/or decision may be based on factors not within their control, such as incomplete or inaccurate data provided by me or distortions of diagnostic images or specimens that may result from electronic transmissions. I understand that the practice of medicine is not an exact science and that Practitioner makes no warranties or guarantees regarding treatment outcomes. I acknowledge that a copy of this consent can be made available to me via my patient portal [facility] MyChart), or I can request a printed copy by calling the office of [facility].    I understand that my insurance will be billed for this visit.   I have read or had this consent read to me. I understand the contents of this consent, which adequately explains the benefits and risks of the Services being provided via telemedicine.  I have been provided ample opportunity to ask questions regarding this consent and the Services and have had my questions answered to my satisfaction. I give my informed consent for the services to be provided through the use of telemedicine in my medical care

## 2022-12-01 NOTE — Telephone Encounter (Signed)
Patient with diagnosis of prior PE/DVT on Xarelto for anticoagulation.    Procedure: robotic right hemicolectomy Date of procedure: 01/06/23  DVT reported in 1999 then again in 2011, has since decreased to prophylactic dose of Xarelto   CrCl 141 Platelet count 288  Per office protocol, patient can hold Xarelto for 3 days prior to procedure.   Patient will not need bridging with Lovenox (enoxaparin) around procedure.  **This guidance is not considered finalized until pre-operative APP has relayed final recommendations.**

## 2022-12-01 NOTE — Telephone Encounter (Signed)
   Name: Ana Baker  DOB: 1966-08-03  MRN: [id number]  Primary Cardiologist: Chrystie Nose, MD   Preoperative team, please contact this patient and set up a phone call appointment for further preoperative risk assessment. Please obtain consent and complete medication review. Thank you for your help.  I confirm that guidance regarding antiplatelet and oral anticoagulation therapy has been completed and, if necessary, noted below.  Per office protocol, patient can hold Xarelto for 3 days prior to procedure.   Patient will not need bridging with Lovenox (enoxaparin) around procedure.   Ronney Asters, NP 12/01/2022, 11:04 AM [facility]

## 2022-12-08 ENCOUNTER — Ambulatory Visit: Payer: Medicare HMO | Attending: Cardiovascular Disease

## 2022-12-08 DIAGNOSIS — Z01810 Encounter for preprocedural cardiovascular examination: Secondary | ICD-10-CM | POA: Diagnosis not present

## 2022-12-08 NOTE — Progress Notes (Signed)
Virtual Visit via Telephone Note   Because of Ana Baker's co-morbid illnesses, she is at least at moderate risk for complications without adequate follow up.  This format is felt to be most appropriate for this patient at this time.  The patient did not have access to video technology/had technical difficulties with video requiring transitioning to audio format only (telephone).  All issues noted in this document were discussed and addressed.  No physical exam could be performed with this format.  Please refer to the patient's chart for her consent to telehealth for [facility].  Evaluation Performed:  Preoperative cardiovascular risk assessment _____________   Date:  12/08/2022   Patient ID:  Ana Baker, DOB July 31, 1966, MRN [id number] Patient Location:  Home Provider location:   Office  Primary Care Provider:  Montez Hageman, DO Primary Cardiologist:  Ana Nose, MD  Chief Complaint / Patient Profile   57 y.o. y/o female with a h/o hypertension, DVT, GERD and anxiety who is pending robotic right hemicolectomy and presents today for telephonic preoperative cardiovascular risk assessment.  History of Present Illness    Ana Baker is a 57 y.o. female who presents via audio/video conferencing for a telehealth visit today.  Pt was last seen in cardiology clinic on 06/27/22 by Ana Shields, NP.  At that time Ana Baker was doing well .  The patient is now pending procedure as outlined above. Since her last visit, she has participated in the prep program through [facility] and lost almost 14 pounds.  She is able to walk a mile now.  Legs still swell at times but usually when she is hanging them down for too long.  She likes to swim and plans to join a local [employer] close to her home.  She feels much better after her weight loss.  No chest pain or shortness of breath.   Per office protocol, patient can hold Xarelto for 3 days prior to procedure.  Patient will not need  bridging with Lovenox (enoxaparin) around procedure. Please restart when medically safe to do so.  Past Medical History    Past Medical History:  Diagnosis Date   Anemia    Anxiety    DDD (degenerative disc disease), lumbar    DDD (degenerative disc disease), lumbar    Depression    Diabetes mellitus without complication (HCC)    DVT (deep venous thrombosis) (HCC) 04/2010   and PE, in post-operative setting    Endometriosis    GERD (gastroesophageal reflux disease)    History of kidney stones    History of pulmonary embolism    Hypertension    Idiopathic parathyroidism (HCC)    Migraines    OA (osteoarthritis)    Obesity    Osteopenia    Osteoporosis    Pneumonia    x2   PONV (postoperative nausea and vomiting)    Spinal stenosis    Venous reflux    left leg   Vitamin D deficiency    Past Surgical History:  Procedure Laterality Date   ABDOMINAL HYSTERECTOMY  2008   BOWEL RESECTION  03/22/04   sbo   CHOLECYSTECTOMY OPEN  1990   COLONOSCOPY W/ POLYPECTOMY     GASTRIC BYPASS  07/31/2003   DVT post-op - IV filter   HERNIA REPAIR  08/2004   Internal hernia repair   JOINT REPLACEMENT     KIDNEY STONE SURGERY  98,00   KNEE ARTHROSCOPY Left 06,07   KNEE ARTHROSCOPY  Left 12/2009   Dr. Thomasena Baker   LIPOMA EXCISION  03/12/04   right arm   NM MYOCAR PERF WALL MOTION  04/2010   dipyridamole; normal pattern of perfusion to all regions; EF 68%; low risk study    SALPINGOOPHORECTOMY Left    SPINE SURGERY  2009   TOTAL KNEE ARTHROPLASTY Right 82,98,99,00   DVT & PE post-op 1999   TOTAL KNEE ARTHROPLASTY Left 12/09/2019   Procedure: TOTAL KNEE ARTHROPLASTY;  Surgeon: Ana Mcalpine, MD;  Location: [facility];  Service: Orthopedics;  Laterality: Left;  adductor canal   TRANSTHORACIC ECHOCARDIOGRAM  03/2012   EF=>55%, trace MR & TR, normal RSVP    Allergies  Allergies  Allergen Reactions   Almond Oil Anaphylaxis and Rash    ALL ALMONDS!   Nsaids Nausea Only   Adhesive [Tape]  Hives and Rash    Burns skin   Penicillins Rash    Did it involve swelling of the face/tongue/throat, SOB, or low BP? No Did it involve sudden or severe rash/hives, skin peeling, or any reaction on the inside of your mouth or Baker? Yes Did you need to seek medical attention at a hospital or doctor's office? Was in the hospital when it happend When did it last happen?      21 years ago If all above answers are "NO", may proceed with cephalosporin use.    Tolmetin Nausea Only   Vancomycin Rash    Home Medications    Prior to Admission medications   Medication Sig Start Date End Date Taking? Authorizing Provider  atorvastatin (LIPITOR) 40 MG tablet Take 1 tablet (40 mg total) by mouth daily. 06/27/22 06/22/23  Alver Sorrow, NP  calcium carbonate (OSCAL) 1500 (600 Ca) MG TABS tablet Take by mouth 2 (two) times daily with a meal.    [provider]  cholecalciferol (VITAMIN D3) 25 MCG (1000 UNIT) tablet Take 1,000 Units by mouth daily.    [provider]  famotidine (PEPCID) 40 MG tablet Take 1 tablet (40 mg total) by mouth at bedtime. 06/27/22   Alver Sorrow, NP  FLUoxetine (PROZAC) 40 MG capsule Take 40 mg daily by mouth.    [provider]  gabapentin (NEURONTIN) 300 MG capsule Take 300 mg by mouth 3 (three) times daily. Take one tablet  by mouth in the morning, and in the afternoon, take 2 tablets  at bedtime.    [provider]  hydrochlorothiazide (MICROZIDE) 12.5 MG capsule TAKE 1 CAPSULE BY MOUTH EVERY DAY 06/27/22   Alver Sorrow, NP  HYDROcodone-acetaminophen (NORCO) 7.5-325 MG tablet Take 1 tablet by mouth 3 (three) times daily as needed. 11/13/21   [provider]  influenza vac split quadrivalent PF (FLUARIX) 0.5 ML injection Inject into the muscle. 06/27/22   Ana Munson, MD  l-methylfolate-B6-B12 (METANX) 3-35-2 MG TABS tablet Take 1 tablet by mouth daily.    [provider]  lubiprostone (AMITIZA) 24  MCG capsule Take 1 capsule (24 mcg total) by mouth 2 times daily with meals. 03/14/22     Methylcobalamin 1 MG CHEW Chew by mouth.    [provider]  metoprolol succinate (TOPROL XL) 25 MG 24 hr tablet Take 1 tablet (25 mg total) by mouth daily. 06/27/22   Alver Sorrow, NP  morphine (MS CONTIN) 15 MG 12 hr tablet Take 15 mg by mouth 3 (three) times daily.  09/14/13   [provider]  nitroGLYCERIN (NITROSTAT) 0.4 MG SL tablet Place 1 tablet (0.4  mg total) under the tongue every 5 (five) minutes as needed for chest pain. 06/27/22 09/25/22  Alver Sorrow, NP  pantoprazole (PROTONIX) 40 MG tablet Take 1 tablet (40 mg total) by mouth 2 (two) times daily. 06/27/22   Alver Sorrow, NP  Pediatric Multivitamins-Iron (CHILDRENS MULTIVITAMIN/IRON PO) Take 1 tablet by mouth daily. Flinstones    [provider]  Pyridoxal-5 Phosphate 100 MG/ML SOLN Inject as directed.    [provider]  rivaroxaban (XARELTO) 10 MG TABS tablet TAKE 1 TABLET BY MOUTH EVERY DAY 07/31/22   Alver Sorrow, NP  SUMAtriptan (IMITREX) 100 MG tablet Take 1 tablet (100 mg total) by mouth every 2 (two) hours as needed for migraine. 10/27/13   Ana Scarce, MD  topiramate (TOPAMAX) 100 MG tablet Take 100 mg by mouth at bedtime. 09/17/19   [provider]    Physical Exam    Vital Signs:  [name] does not have vital signs available for review today.  Given telephonic nature of communication, physical exam is limited. AAOx3. NAD. Normal affect.  Speech and respirations are unlabored.  Accessory Clinical Findings    None  Assessment & Plan    1.  Preoperative Cardiovascular Risk Assessment:  Ms. Polich perioperative risk of a major cardiac event is 0.9% according to the Revised Cardiac Risk Index (RCRI).  Therefore, she is at low risk for perioperative complications.   Her functional capacity is good at 5.99 METs according to the Duke Activity Status Index  (DASI). Recommendations: According to ACC/AHA guidelines, no further cardiovascular testing needed.  The patient may proceed to surgery at acceptable risk.   Antiplatelet and/or Anticoagulation Recommendations:  Xarelto (Rivaroxaban) can be held for 3 days prior to surgery.  Please resume post op when felt to be safe.     A copy of this note will be routed to requesting surgeon.  Time:   Today, I have spent 10 minutes with the patient with telehealth technology discussing medical history, symptoms, and management plan.     Sharlene Dory, PA-C  12/08/2022, 2:53 PM

## 2022-12-15 ENCOUNTER — Telehealth: Payer: Self-pay | Admitting: Licensed Clinical Social Worker

## 2022-12-16 NOTE — Telephone Encounter (Addendum)
H&V Care Navigation CSW Progress Note  Clinical Social Worker  received a call from pt  to f/u on PAP renewal that the pt received from [employer] for her Xarelto. LCSW spoke with pt about re-applying, may be ineligible for further assistance based on income and coverage. We discussed if ineligible then she should contact [facility] counselors in [place] to see of any additional options available. I have mailed her that information, she will work on her PAP and bring to office. Encouraged her to contact me again if any additional questions/concerns.  Patient is participating in a Managed Medicaid Plan:  No, [employer] Medicare only  SDOH Screenings   Food Insecurity: No Food Insecurity (03/14/2022)  Housing: Low Risk  (03/14/2022)  Transportation Needs: No Transportation Needs (03/14/2022)  Financial Resource Strain: Low Risk  (06/17/2022)  Tobacco Use: Low Risk  (10/02/2022)    Octavio Graves, MSW, LCSW Clinical Social Worker II [facility] Navigation  [phone]- work cell phone (preferred) [phone]- desk phone

## 2023-01-13 ENCOUNTER — Ambulatory Visit: Payer: Medicare HMO | Admitting: Internal Medicine

## 2023-02-25 ENCOUNTER — Ambulatory Visit (HOSPITAL_BASED_OUTPATIENT_CLINIC_OR_DEPARTMENT_OTHER): Payer: Medicare HMO | Admitting: Family

## 2023-02-25 ENCOUNTER — Encounter (HOSPITAL_BASED_OUTPATIENT_CLINIC_OR_DEPARTMENT_OTHER): Payer: Self-pay | Admitting: Family

## 2023-02-25 VITALS — BP 88/62 | HR 94 | Ht 68.0 in | Wt 263.4 lb

## 2023-02-25 DIAGNOSIS — I493 Ventricular premature depolarization: Secondary | ICD-10-CM | POA: Diagnosis not present

## 2023-02-25 DIAGNOSIS — I959 Hypotension, unspecified: Secondary | ICD-10-CM

## 2023-02-25 DIAGNOSIS — D6859 Other primary thrombophilia: Secondary | ICD-10-CM

## 2023-02-25 DIAGNOSIS — Z86711 Personal history of pulmonary embolism: Secondary | ICD-10-CM | POA: Diagnosis not present

## 2023-02-25 LAB — CBC
Hematocrit: 45.7 % (ref 34.0–46.6)
Hemoglobin: 14.9 g/dL (ref 11.1–15.9)
MCH: 30.8 pg (ref 26.6–33.0)
MCHC: 32.6 g/dL (ref 31.5–35.7)
MCV: 95 fL (ref 79–97)
Platelets: 383 10*3/uL (ref 150–450)
RBC: 4.83 x10E6/uL (ref 3.77–5.28)
RDW: 13.3 % (ref 11.7–15.4)
WBC: 7.3 10*3/uL (ref 3.4–10.8)

## 2023-02-25 MED ORDER — RIVAROXABAN 10 MG PO TABS
10.0000 mg | ORAL_TABLET | Freq: Every day | ORAL | 3 refills | Status: DC
Start: 2023-02-25 — End: 2023-05-01

## 2023-02-25 NOTE — Patient Instructions (Signed)
Medication Instructions:  Your physician has recommended you make the following change in your medication:   Stop: hydrochlorothiazide   *If you need a refill on your cardiac medications before your next appointment, please call your pharmacy*   Lab Work: Your physician recommends that you return for lab work today- CBC   If you have labs (blood work) drawn today and your tests are completely normal, you will receive your results only by: MyChart Message (if you have MyChart) OR A paper copy in the mail If you have any lab test that is abnormal or we need to change your treatment, we will call you to review the results.  Follow-Up: At [facility], you and your health needs are our priority.  As part of our continuing mission to provide you with exceptional heart care, we have created designated Provider Care Teams.  These Care Teams include your primary Cardiologist (physician) and Advanced Practice Providers (APPs -  Physician Assistants and Nurse Practitioners) who all work together to provide you with the care you need, when you need it.  We recommend signing up for the patient portal called "MyChart".  Sign up information is provided on this After Visit Summary.  MyChart is used to connect with patients for Virtual Visits (Telemedicine).  Patients are able to view lab/test results, encounter notes, upcoming appointments, etc.  Non-urgent messages can be sent to your provider as well.   To learn more about what you can do with MyChart, go to [email or web address].    Your next appointment:   6 months with Dr. Rennis Golden or Gillian Shields, NP  Other Instructions Orthostatic Hypotension Blood pressure is a measurement of how strongly, or weakly, your circulating blood is pressing against the walls of your arteries. Orthostatic hypotension is a drop in blood pressure that can happen when you change positions, such as when you go from lying down to standing. Arteries are blood  vessels that carry blood from your heart throughout your body. When blood pressure is too low, you may not get enough blood to your brain or to the rest of your organs. Orthostatic hypotension can cause light-headedness, sweating, rapid heartbeat, blurred vision, and fainting. These symptoms require further investigation into the cause. What are the causes? Orthostatic hypotension can be caused by many things, including: Sudden changes in posture, such as standing up quickly after you have been sitting or lying down. Loss of blood (anemia) or loss of body fluids (dehydration). Heart problems, neurologic problems, or hormone problems. Pregnancy. Aging. The risk for this condition increases as you get older. Severe infection (sepsis). Certain medicines, such as medicines for high blood pressure or medicines that make the body lose excess fluids (diuretics). What are the signs or symptoms? Symptoms of this condition may include: Weakness, light-headedness, or dizziness. Sweating. Blurred vision. Tiredness (fatigue). Rapid heartbeat. Fainting, in severe cases. How is this diagnosed? This condition is diagnosed based on: Your symptoms and medical history. Your blood pressure measurements. Your health care provider will check your blood pressure when you are: Lying down. Sitting. Standing. A blood pressure reading is recorded as two numbers, such as "120 over 80" (or 120/80). The first ("top") number is called the systolic pressure. It is a measure of the pressure in your arteries as your heart beats. The second ("bottom") number is called the diastolic pressure. It is a measure of the pressure in your arteries when your heart relaxes between beats. Blood pressure is measured in a unit called mmHg. Healthy blood  pressure for most adults is 120/80 mmHg. Orthostatic hypotension is defined as a 20 mmHg drop in systolic pressure or a 10 mmHg drop in diastolic pressure within 3 minutes of  standing. Other information or tests that may be used to diagnose orthostatic hypotension include: Your other vital signs, such as your heart rate and temperature. Blood tests. An electrocardiogram (ECG) or echocardiogram. A Holter monitor. This is a device you wear that records your heart rhythm continuously, usually for 24-48 hours. Tilt table test. For this test, you will be safely secured to a table that moves you from a lying position to an upright position. Your heart rhythm and blood pressure will be monitored during the test. How is this treated? This condition may be treated by: Changing your diet. This may involve eating more salt (sodium) or drinking more water. Changing the dosage of certain medicines you are taking that might be lowering your blood pressure. Correcting the underlying reason for the orthostatic hypotension. Wearing compression stockings. Taking medicines to raise your blood pressure. Avoiding actions that trigger symptoms. Follow these instructions at home: Medicines Take over-the-counter and prescription medicines only as told by your health care provider. Follow instructions from your health care provider about changing the dosage of your current medicines, if this applies. Do not stop or adjust any of your medicines on your own. Eating and drinking  Drink enough fluid to keep your urine pale yellow. Eat extra salt only as directed. Do not add extra salt to your diet unless advised by your health care provider. Eat frequent, small meals. Avoid standing up suddenly after eating. General instructions  Get up slowly from lying down or sitting positions. This gives your blood pressure a chance to adjust. Avoid hot showers and excessive heat as directed by your health care provider. Engage in regular physical activity as directed by your health care provider. If you have compression stockings, wear them as told. Keep all follow-up visits. This is  important. Contact a health care provider if: You have a fever for more than 2-3 days. You feel more thirsty than usual. You feel dizzy or weak. Get help right away if: You have chest pain. You have a fast or irregular heartbeat. You become sweaty or feel light-headed. You feel short of breath. You faint. You have any symptoms of a stroke. "BE FAST" is an easy way to remember the main warning signs of a stroke: B - Balance. Signs are dizziness, sudden trouble walking, or loss of balance. E - Eyes. Signs are trouble seeing or a sudden change in vision. F - Face. Signs are sudden weakness or numbness of the face, or the face or eyelid drooping on one side. A - Arms. Signs are weakness or numbness in an arm. This happens suddenly and usually on one side of the body. S - Speech. Signs are sudden trouble speaking, slurred speech, or trouble understanding what people say. T - Time. Time to call emergency services. Write down what time symptoms started. You have other signs of a stroke, such as: A sudden, severe headache with no known cause. Nausea or vomiting. Seizure. These symptoms may represent a serious problem that is an emergency. Do not wait to see if the symptoms will go away. Get medical help right away. Call your local emergency services (911 in the [place] Do not drive yourself to the hospital. Summary Orthostatic hypotension is a sudden drop in blood pressure. It can cause light-headedness, sweating, rapid heartbeat, blurred vision, and fainting.  Orthostatic hypotension can be diagnosed by having your blood pressure taken while lying down, sitting, and then standing. Treatment may involve changing your diet, wearing compression stockings, sitting up slowly, adjusting your medicines, or correcting the underlying reason for the orthostatic hypotension. Get help right away if you have chest pain, a fast or irregular heartbeat, or symptoms of a stroke. This information is not intended  to replace advice given to you by your health care provider. Make sure you discuss any questions you have with your health care provider. Document Revised: 10/25/2020 Document Reviewed: 10/25/2020 Elsevier Patient Education  2024 [employer].

## 2023-02-25 NOTE — Progress Notes (Signed)
Cardiology Office Note:  .   Date:  02/25/2023  ID:  [name], DOB [birth year], MRN [id number] PCP: Feliberto Gottron, MD  [facility] Providers Cardiologist:  Chrystie Nose, MD    History of Present Illness: .   JALLIYAH PERT is a 57 y.o. female hx of  recurrent DVT/PE on Xarelto, spinal stenosis, obesity s/p bariatric surgery, hypertension, CAD last seen 06/27/22. Family history notable for heart failure in her grandmother and father.   ED visit 03/18/21 for chest pain which was central to right sided and worse with activity. EKG with no acute ST/T wave changes. CXR with mild bronchitic changes. D-dimer and troponin unremarkable.   Seen in follow up 03/20/21 with reports of 1 week history of chest pain radiating from right to left chest described as a pressure. Given hypotension, candesartan-HCTZ was transitioned to HCTZ alone.  Subsequent cardiac CTA 03/28/2021 with coronary calcium score of 185 placing her in the 98th percentile for age, sex, race matched control.  She had mild mixed density plaque 25-49% in the mid LAD and minimal calcified plaque in the mid and distal RCA less than 25%.  Preventative therapy and risk factor modification were recommended.  Rosuvastatin 20 mg daily was initiated.   She was seen 04/15/21 noting episodes of chest discomfort with exertion and dstable dyspnea on exertion. She was started on low dose Toprol for antianginal benefit. Increased to 25mg  QD with improvement in symptoms. Clinic visit 05/2021 she was seen as she was recuperating from COVID the month prior.  However her dyspnea on exertion continue to improve with metoprolol.  She had sleep study November 2022 with no evidence of obstructive sleep apnea and only mild sleep apnea during REM sleep with no recommendation for CPAP.   Seen 12/27/21 and her Rosuvastatin was transitioned to Atorvastatin due to cost. Xarelto patient assistance was obtained.  At follow-up 06/26/2022 she was referred to prep exercise  program.  LDL was at goal of less than 70.  On 01/06/23 she underwent robotic ileocecectomy due to 15mm polyp in ileocecal valve.    She presents today for follow-up.  In her spare time she enjoys crocheting. Completed PREP exercise program which she enjoyed. Planning to return to [facility] this week as has clearance from her surgeon. She has had 30 lbs successful weight loss since diet and exercise changes. Does note episodes of lightheadedness particularly with position changes. She is seeing urologist next month due to finding of kidney stones. PCP recommended Flomax but she is concerned about passing the stones due to size. Reports no shortness of breath nor dyspnea on exertion. Reports no chest pressure, or tightness. No edema, orthopnea, PND. Reports no palpitations.   Labs at [facility] PCP 02/12/23 creatinine 1.01, K 3.8, AST 28, ALT 41, A1c 5.6,   ROS: Please see the history of present illness.    All other systems reviewed and are negative.   Studies Reviewed: Marland Kitchen   EKG Interpretation Date/Time:  Wednesday February 25 2023 08:04:14 EDT Ventricular Rate:  94 PR Interval:  172 QRS Duration:  98 QT Interval:  380 QTC Calculation: 475 R Axis:   19  Text Interpretation: Sinus rhythm with occasional Premature ventricular complexes  No acute ST/T wave changes. Confirmed by Gillian Shields ([id number]) on 02/25/2023 8:05:41 AM    Cardiac Studies & Procedures     STRESS TESTS  NM MYOCAR MULTI W/SPECT W 09/01/2014      CT SCANS  CT CORONARY MORPH W/CTA COR  W/SCORE 03/28/2021  Addendum 03/28/2021  2:16 PM ADDENDUM REPORT: 03/28/2021 14:13  EXAM: OVER-READ INTERPRETATION  CT CHEST  The following report is an over-read performed by radiologist Dr. Deberah Pelton [facility] on 03/28/2021. This over-read does not include interpretation of cardiac or coronary anatomy or pathology. The coronary calcium score/coronary CTA interpretation by the cardiologist is attached.  COMPARISON:   None.  FINDINGS: The visualized portions of the lower lung fields show no suspicious nodules, masses, or infiltrates. No pleural fluid seen.  The visualized portions of the mediastinum and chest wall are unremarkable.  IMPRESSION: No significant non-cardiovascular abnormality in visualized portion of the thorax.   Electronically Signed By: Danae Orleans M.D. On: 03/28/2021 14:13  Narrative CLINICAL DATA:  Chest pain  EXAM: Cardiac/Coronary CTA  TECHNIQUE: A non-contrast, gated CT scan was obtained with axial slices of 3 mm through the heart for calcium scoring. Calcium scoring was performed using the Agatston method. A 110 kV prospective, gated, contrast cardiac scan was obtained. Gantry rotation speed was 250 msecs and collimation was 0.6 mm. Two sublingual nitroglycerin tablets (0.8 mg) were given. The 3D data set was reconstructed in 5% intervals of the 35-75% of the R-R cycle. Diastolic phases were analyzed on a dedicated workstation using MPR, MIP, and VRT modes. The patient received 95 cc of contrast.  FINDINGS: Image quality: Excellent.  Noise artifact is: Limited.  Coronary Arteries:  Normal coronary origin.  Right dominance.  Left main: The left main is a large caliber vessel with a normal take off from the left coronary cusp that bifurcates to form a left anterior descending artery and a left circumflex artery. There is no plaque or stenosis.  Left anterior descending artery: The proximal LAD is patent. The mid LAD contains mild mixed density plaque (25-49%). The distal LAD is hypoplastic and the LAD terminates as a second diagonal branch. The first diagonal is small and patent.  Left circumflex artery: The LCX is non-dominant and patent with no evidence of plaque or stenosis. The LCX gives off 1 patent obtuse marginal branch.  Right coronary artery: The RCA is dominant with normal take off from the right coronary cusp. There is minimal calcified  plaque (<25%) in the mid and distal RCA. The RCA terminates as a PDA and right posterolateral branch without evidence of plaque or stenosis.  Right Atrium: Right atrial size is within normal limits.  Right Ventricle: The right ventricular cavity is within normal limits.  Left Atrium: Left atrial size is normal in size with no left atrial appendage filling defect.  Left Ventricle: The ventricular cavity size is within normal limits. There are no stigmata of prior infarction. There is no abnormal filling defect.  Pulmonary arteries: Normal in size without proximal filling defect.  Pulmonary veins: Normal pulmonary venous drainage.  Pericardium: Normal thickness with no significant effusion or calcium present.  Cardiac valves: The aortic valve is trileaflet without significant calcification. The mitral valve is normal structure without significant calcification.  Aorta: Normal caliber with no significant disease.  Extra-cardiac findings: See attached radiology report for non-cardiac structures.  IMPRESSION: 1. Coronary calcium score of 185. This was 98th percentile for age-, sex, and race-matched controls.  2. Normal coronary origin with right dominance.  3. Mild mixed density plaque (25-49%) in the mid LAD.  4. Minimal calcified plaque in the mid and distal RCA (<25%).  RECOMMENDATIONS: 1. Mild non-obstructive CAD (25-49%). Consider non-atherosclerotic causes of chest pain. Consider preventive therapy and risk factor modification.  Lennie Odor, MD  Electronically Signed: By: Lennie Odor On: 03/28/2021 11:22          Risk Assessment/Calculations:             Physical Exam:   VS:  BP (!) 88/62   Pulse 94   Ht 5\' 8"  (1.727 m)   Wt 263 lb 6.4 oz (119.5 kg)   BMI 40.05 kg/m    Wt Readings from Last 3 Encounters:  02/25/23 263 lb 6.4 oz (119.5 kg)  11/26/22 284 lb (128.8 kg)  11/10/22 288 lb 6.4 oz (130.8 kg)    GEN: Well nourished, well developed in  no acute distress NECK: No JVD; No carotid bruits CARDIAC: RRR, no murmurs, rubs, gallops RESPIRATORY:  Clear to auscultation without rales, wheezing or rhonchi  ABDOMEN: Soft, non-tender, non-distended EXTREMITIES:  No edema; No deformity   ASSESSMENT AND PLAN: .    Nonobstructive CAD / Aortic atherosclerosis / HLD, LDL goal <70- Cardiac CTA 08/6107 with moderate nonobstructive plaque in LAD 25 to 49% and minimal atherosclerosis in the RCA less than 25%. Continue risk factor modification. Continue Metoprolol 25mg  QD which has provided antianginal benefit. Heart healthy diet and regular cardiovascular exercise encouraged. Continue Atorvastatin 40mg , no myalgias.    PVC - Noted by EKG today. Notes she has had extra heart beats dating back to high school. Continue Toprol 25mg  every day. She is unaware of palpitations.   Recurrent DVT/PE on Xarelto - Due to hypotension today, plan for CBC to ule out anemia. With new insurance coverage she is able to afford. Refill Xarelto provided.  Hypotension - Hypotensive today 88/62 with some lightheadedness. Plan for CBC to rule out anemia. Will discontinue hydrochlorothiazide. Discussed to monitor BP at home at least 2 hours after medications and sitting for 5-10 minutes.    Obesity - Congratulated on 30 pound weight. Weight loss via diet and exercise encouraged. Discussed the impact being overweight would have on cardiovascular risk and coronary artery disease.            Dispo: follow up in 6 months   Signed, Alver Sorrow, NP

## 2023-02-27 ENCOUNTER — Telehealth (HOSPITAL_BASED_OUTPATIENT_CLINIC_OR_DEPARTMENT_OTHER): Payer: Self-pay

## 2023-02-27 ENCOUNTER — Other Ambulatory Visit: Payer: Self-pay | Admitting: Pharmacist Clinician (PhC)/ Clinical Pharmacy Specialist

## 2023-02-27 ENCOUNTER — Encounter (HOSPITAL_BASED_OUTPATIENT_CLINIC_OR_DEPARTMENT_OTHER): Payer: Self-pay

## 2023-02-27 MED ORDER — RIVAROXABAN 10 MG PO TABS: 10.0000 mg | ORAL_TABLET | Freq: Every day | ORAL | 0 refills | Status: DC

## 2023-02-27 NOTE — Progress Notes (Signed)
Results called to patient who was already aware from My Chart.  No questions. Jim Like MHA RN CCM

## 2023-02-27 NOTE — Telephone Encounter (Signed)
02/27/2023 Results called to patient who was already aware from My Chart.  No questions. Jim Like MHA RN CCM

## 2023-03-02 NOTE — Telephone Encounter (Signed)
Will you put the Xarelto patient assistance paperwork with the samples you left for her, she is coming by tomorrow.

## 2023-03-02 NOTE — Telephone Encounter (Signed)
Patient responding back to you!  

## 2023-04-06 ENCOUNTER — Telehealth: Payer: Self-pay | Admitting: Licensed Clinical Social Worker

## 2023-04-06 NOTE — Telephone Encounter (Signed)
H&V Care Navigation CSW Progress Note  Clinical Social Worker  received a call from pt  to share that she has received her denial letter from Low Income Subsidy/Extra Help program. She hasn't received Xarelto Patient Assistance application yet from what she can tell. I will connect with PharmD to ensure app sent and if not resend it/assist with additional samples. Once pt has received application and completed it then we can have her bring all paperwork to office to submit and have provider portion completed.    Patient is participating in a Managed Medicaid Plan:  No, [employer] Medicare  SDOH Screenings   Food Insecurity: No Food Insecurity (02/11/2023)   Received from [facility], [facility]  Housing: Low Risk  (03/14/2022)  Transportation Needs: No Transportation Needs (02/11/2023)   Received from [facility], [facility]  Utilities: Not At Risk (02/11/2023)   Received from [facility], [facility]  Financial Resource Strain: Low Risk  (02/11/2023)   Received from [facility], [facility]  Physical Activity: Insufficiently Active (02/11/2023)   Received from [facility], [facility]  Social Connections: Moderately Integrated (02/11/2023)   Received from [facility], [facility]  Stress: Stress Concern Present (02/11/2023)   Received from [facility], [facility]  Tobacco Use: Low Risk  (02/25/2023)    Octavio Graves, MSW, LCSW Clinical Social Worker II [facility] Heart/Vascular Care Navigation  [phone]- work cell phone (preferred) [phone]- desk phone

## 2023-04-07 MED ORDER — RIVAROXABAN 10 MG PO TABS: 10.0000 mg | ORAL_TABLET | Freq: Every day | ORAL | Status: DC

## 2023-04-07 NOTE — Telephone Encounter (Signed)
3 weeks of xarelto 10mg  samples and PAP left for patient for pick up. Message sent thru MyChart

## 2023-04-07 NOTE — Addendum Note (Signed)
Addended by: Lindell Spar on: 04/07/2023 04:45 PM   Modules accepted: Orders

## 2023-04-07 NOTE — Telephone Encounter (Signed)
Contacted [employer] as no samples at [employer].   Octavio Graves, MSW, LCSW Clinical Social Worker II [facility] Navigation  [phone]- work cell phone (preferred) [phone]- desk phone

## 2023-04-07 NOTE — Telephone Encounter (Signed)
Routed to pharmacy team to verify dose of Xarelto for samples.  This Clinical research associate has not mailed out Xarelto PAP as was unaware patient needed this.

## 2023-04-07 NOTE — Telephone Encounter (Signed)
No xarelto 10mg  samples at [facility] office

## 2023-04-07 NOTE — Telephone Encounter (Signed)
Xarelto 10mg  daily dosing is correct for prevention of recurrent VTE. Ok to provide 1-2 weeks of samples (not sure if the office has 10mg  dose but hopefully so), I'm not sure that Medicare patients can qualify for the Xarelto free med program anymore. Their donut hole program is called Xarelto with Me and they can get Xarelto for $89/1 month supply though. They can call to enroll at 888-XARELTO [phone]).

## 2023-04-24 NOTE — Telephone Encounter (Signed)
Xarelto patient assistance faxed to [phone]

## 2023-04-28 NOTE — Telephone Encounter (Signed)
H&V Care Navigation CSW Progress Note  Clinical Social Worker  received call from pt  to f/u on Xarelto Patient Assistance. LCSW shared with pt that application had been submitted Friday, confirmed received by Laural Benes and Laural Benes and may need 2 days to process. Encouraged her to call clinical team back later this week. Pt has 6 more days of samples.  Patient is participating in a Managed Medicaid Plan:  No, [employer] medicare  SDOH Screenings   Food Insecurity: No Food Insecurity (02/11/2023)   Received from [facility], [facility]  Housing: Low Risk  (03/14/2022)  Transportation Needs: No Transportation Needs (02/11/2023)   Received from [facility], [facility]  Utilities: Not At Risk (02/11/2023)   Received from [facility], [facility]  Financial Resource Strain: Low Risk  (02/11/2023)   Received from [facility], [facility]  Physical Activity: Insufficiently Active (02/11/2023)   Received from [facility], [facility]  Social Connections: Moderately Integrated (02/11/2023)   Received from [facility], [facility]  Stress: Stress Concern Present (02/11/2023)   Received from [facility], [facility]  Tobacco Use: Low Risk  (04/16/2023)   Received from [facility]   Octavio Graves, MSW, LCSW Clinical Social Worker II [facility] Heart/Vascular Care Navigation  [phone]- work cell phone (preferred) [phone]- desk phone

## 2023-04-29 NOTE — Telephone Encounter (Signed)
Chrystie Nose, MD  You; Cv Div Pharmd6 minutes ago (3:06 PM)    I would recommend trying Eliquis 2.5 mg twice daily dose as our pharmacist Megan mentioned - warfarin is inferior and a higher risk of clot is not worth the cost savings in my opinion. Costs may be a little higher this year - but are expected to improve next year as the "donut hole" goes away.  Dr. Rennis Golden

## 2023-04-29 NOTE — Telephone Encounter (Signed)
Received fax that patient has NOT been approved for Xarelto patient assistance - scanned under media.

## 2023-04-29 NOTE — Telephone Encounter (Signed)
She takes low dose Xarelto 10mg  daily for prevention of recurrent VTE. She was denied Low Income Subsidy through [employer] and was denied patient assistance for Xarelto (they stopped pt assistance for those with insurance, she would only be able to use the $89/month Xarelto with me program which is cost prohibitive for her).   Could have her try applying for Eliquis patient assistance for the 2.5mg  BID dose, they accept Medicare patients as long as they have spent 3% of their annual household income on meds, she would need to provide proof of this (her pharmacy can print report).   Pradaxa is not approved for long term prevention of recurrent VTE. Only other option would be warfarin. Her plan will reset in January though at which time Xarelto copay will return to normal. Donut hole is going away next year as well so ideally would not run into this issue again in future years.

## 2023-04-29 NOTE — Telephone Encounter (Addendum)
Per Rutherford Nail  So [employer] is a [employer] program that assists Medicare eligible patients apply for additional benefits. She already went through them to apply for the Low Income Subsidy (Extra Help) program that was denied as well- she makes too much for most, if not all, patient assistance programs.    She can try the manufacturers donut hole program called Xarelto with Me and they can get Xarelto for $89/1 month supply possibly. She can call to enroll at 888-XARELTO [phone])   Spoke with patient about the assistance denial. She is tearful on the phone. Pradaxa is also $70/month cash price which is too costly for her.  She has 5 days left of Xarelto 10mg   Routed to MD/PharmD team for assistance

## 2023-04-30 ENCOUNTER — Other Ambulatory Visit (HOSPITAL_COMMUNITY): Payer: Self-pay

## 2023-04-30 ENCOUNTER — Other Ambulatory Visit (HOSPITAL_BASED_OUTPATIENT_CLINIC_OR_DEPARTMENT_OTHER): Payer: Self-pay | Admitting: Family

## 2023-04-30 NOTE — Telephone Encounter (Signed)
H&V Care Navigation CSW Progress Note  Clinical Social Worker  received a call from pt  to inquire about Eliquis patient assistance. Note that pharmacy team and Eileen Stanford, RN, are working on pt concerns. I encouraged her to wait and hear their further recommendations.  Patient is participating in a Managed Medicaid Plan:  No, [employer] Medicare  SDOH Screenings   Food Insecurity: No Food Insecurity (02/11/2023)   Received from [facility], [facility]  Housing: Low Risk  (03/14/2022)  Transportation Needs: No Transportation Needs (02/11/2023)   Received from [facility], [facility]  Utilities: Not At Risk (02/11/2023)   Received from [facility], [facility]  Financial Resource Strain: Low Risk  (02/11/2023)   Received from [facility], [facility]  Physical Activity: Insufficiently Active (02/11/2023)   Received from [facility], [facility]  Social Connections: Moderately Integrated (02/11/2023)   Received from [facility], [facility]  Stress: Stress Concern Present (02/11/2023)   Received from [facility], [facility]  Tobacco Use: Low Risk  (04/29/2023)   Received from [facility]    Octavio Graves, MSW, LCSW Clinical Social Worker II [facility] Heart/Vascular Care Navigation  [phone]- work cell phone (preferred) [phone]- desk phone

## 2023-04-30 NOTE — Telephone Encounter (Signed)
Patient of Dr. Debara Pickett. Please review for refill. Thank you!

## 2023-04-30 NOTE — Telephone Encounter (Signed)
Let's have the pharmacy techs run a test claim to see if dabigatran would be affordable.  We're starting to see it covered for $20-40/month.  It might be a more reasonable option for her.  I'll reach out to them.

## 2023-04-30 NOTE — Telephone Encounter (Signed)
Apparently I had this idea already.  Comes to ~$67 per month and she said that was too expensive.  I really don't know what else to do besides warfarin

## 2023-05-01 MED ORDER — WARFARIN SODIUM 5 MG PO TABS: ORAL_TABLET | ORAL | 0 refills | Status: DC

## 2023-05-01 NOTE — Addendum Note (Signed)
Addended by: Rosalee Kaufman on: 05/01/2023 02:35 PM   Modules accepted: Orders

## 2023-05-01 NOTE — Telephone Encounter (Signed)
Spoke with patient - she had been on warfarin in 2014 - 10 mg every day x 5 mg Monday.    She has 3 doses of Xarelto left.  Will have her start warfarin 10 mg x 3 days then 5 mg x 3 days.  First INR check scheduled for Thurs 05-07-23 at 2:30.  Patient aware.

## 2023-05-07 ENCOUNTER — Ambulatory Visit: Payer: Medicare HMO | Attending: Internal Medicine

## 2023-05-07 DIAGNOSIS — Z7901 Long term (current) use of anticoagulants: Secondary | ICD-10-CM

## 2023-05-07 DIAGNOSIS — Z86711 Personal history of pulmonary embolism: Secondary | ICD-10-CM

## 2023-05-07 LAB — POCT INR: INR: 1.6 — AB (ref 2.0–3.0)

## 2023-05-07 NOTE — Patient Instructions (Addendum)
Description   START taking 10mg  daily except 5mg  on Mondays and Wednesdays.  Stay consistent with greens each week (1 per week)  [facility] [phone]     A full discussion of the nature of anticoagulants has been carried out.  A benefit risk analysis has been presented to the patient, so that they understand the justification for choosing anticoagulation at this time. The need for frequent and regular monitoring, precise dosage adjustment and compliance is stressed.  Side effects of potential bleeding are discussed.  The patient should avoid any OTC items containing aspirin or ibuprofen, and should avoid great swings in general diet.  Avoid alcohol consumption.

## 2023-05-14 ENCOUNTER — Ambulatory Visit: Payer: Medicare HMO | Attending: Internal Medicine | Admitting: *Deleted

## 2023-05-14 DIAGNOSIS — Z86711 Personal history of pulmonary embolism: Secondary | ICD-10-CM | POA: Diagnosis not present

## 2023-05-14 DIAGNOSIS — Z7901 Long term (current) use of anticoagulants: Secondary | ICD-10-CM | POA: Diagnosis not present

## 2023-05-14 LAB — POCT INR: INR: 2.1 (ref 2.0–3.0)

## 2023-05-14 NOTE — Patient Instructions (Addendum)
Description   Continue taking 10mg  daily except 5mg  on Mondays and Wednesdays.  Stay consistent with greens each week (1 per week). Recheck INR in 1 week. [facility] [phone]

## 2023-05-21 ENCOUNTER — Ambulatory Visit: Payer: Medicare HMO | Attending: Cardiology

## 2023-05-21 DIAGNOSIS — Z5181 Encounter for therapeutic drug level monitoring: Secondary | ICD-10-CM

## 2023-05-21 DIAGNOSIS — Z86711 Personal history of pulmonary embolism: Secondary | ICD-10-CM | POA: Diagnosis not present

## 2023-05-21 LAB — POCT INR: INR: 2 (ref 2.0–3.0)

## 2023-05-21 MED ORDER — WARFARIN SODIUM 5 MG PO TABS
ORAL_TABLET | ORAL | 0 refills | Status: DC
Start: 2023-05-21 — End: 2023-07-10

## 2023-05-21 NOTE — Patient Instructions (Signed)
Description   Take 12.5mg  today and then continue taking 10mg  daily except 5mg  on Mondays and Wednesdays.  Stay consistent with greens each week (1 per week).  Recheck INR in 2 weeks. [facility] [phone]

## 2023-06-04 ENCOUNTER — Ambulatory Visit: Payer: Medicare HMO | Attending: Internal Medicine | Admitting: *Deleted

## 2023-06-04 DIAGNOSIS — Z86711 Personal history of pulmonary embolism: Secondary | ICD-10-CM

## 2023-06-04 DIAGNOSIS — Z7901 Long term (current) use of anticoagulants: Secondary | ICD-10-CM

## 2023-06-04 LAB — POCT INR: INR: 2.3 (ref 2.0–3.0)

## 2023-06-04 NOTE — Patient Instructions (Signed)
Description   Continue taking 10mg  daily except 5mg  on Mondays and Wednesdays.  Stay consistent with greens each week (1 per week).  Recheck INR in 3 weeks. [facility] [phone]

## 2023-06-17 ENCOUNTER — Other Ambulatory Visit (HOSPITAL_BASED_OUTPATIENT_CLINIC_OR_DEPARTMENT_OTHER): Payer: Self-pay | Admitting: Family

## 2023-06-25 ENCOUNTER — Ambulatory Visit: Payer: Medicare HMO | Attending: Cardiology

## 2023-06-25 DIAGNOSIS — Z86711 Personal history of pulmonary embolism: Secondary | ICD-10-CM | POA: Diagnosis not present

## 2023-06-25 DIAGNOSIS — Z5181 Encounter for therapeutic drug level monitoring: Secondary | ICD-10-CM | POA: Diagnosis not present

## 2023-06-25 LAB — POCT INR: INR: 1.8 — AB (ref 2.0–3.0)

## 2023-06-25 NOTE — Patient Instructions (Signed)
Description   Take 2.5 tablets today and then continue taking 10mg  daily except 5mg  on Mondays and Wednesdays.  Stay consistent with greens each week (1 per week).  Recheck INR in 2 weeks. [facility] [phone]

## 2023-07-09 ENCOUNTER — Ambulatory Visit: Payer: Medicare HMO | Attending: Cardiology

## 2023-07-09 DIAGNOSIS — Z5181 Encounter for therapeutic drug level monitoring: Secondary | ICD-10-CM | POA: Diagnosis not present

## 2023-07-09 DIAGNOSIS — Z86711 Personal history of pulmonary embolism: Secondary | ICD-10-CM

## 2023-07-09 LAB — POCT INR: INR: 2.1 (ref 2.0–3.0)

## 2023-07-09 NOTE — Patient Instructions (Signed)
Description   Continue taking 10mg  daily except 5mg  on Mondays and Wednesdays.  Stay consistent with greens each week (1 per week).  Recheck INR in 3 weeks. [facility] [phone]

## 2023-07-10 ENCOUNTER — Other Ambulatory Visit: Payer: Self-pay | Admitting: Internal Medicine

## 2023-07-10 DIAGNOSIS — Z86711 Personal history of pulmonary embolism: Secondary | ICD-10-CM

## 2023-07-10 MED ORDER — WARFARIN SODIUM 5 MG PO TABS: ORAL_TABLET | ORAL | 0 refills | Status: DC

## 2023-07-10 NOTE — Telephone Encounter (Signed)
Prescription refill request received for warfarin Lov: 02/25/23 [name])  Next INR check: 07/30/23 Warfarin tablet strength: 5mg   Appropriate dose. Refill sent.

## 2023-07-17 ENCOUNTER — Other Ambulatory Visit (HOSPITAL_BASED_OUTPATIENT_CLINIC_OR_DEPARTMENT_OTHER): Payer: Self-pay | Admitting: Family

## 2023-07-30 ENCOUNTER — Ambulatory Visit: Payer: Medicare HMO | Attending: Internal Medicine | Admitting: *Deleted

## 2023-07-30 DIAGNOSIS — Z86711 Personal history of pulmonary embolism: Secondary | ICD-10-CM | POA: Diagnosis not present

## 2023-07-30 DIAGNOSIS — Z7901 Long term (current) use of anticoagulants: Secondary | ICD-10-CM

## 2023-07-30 LAB — POCT INR: INR: 1.9 — AB (ref 2.0–3.0)

## 2023-07-30 NOTE — Patient Instructions (Signed)
Description   Today take 12.5mg   (2.5 tablets) of warfarin then continue taking 10mg  daily except 5mg  on Mondays and Wednesdays.  Stay consistent with greens each week (1 per week).  Recheck INR in 3 weeks. [facility] [phone]

## 2023-08-03 ENCOUNTER — Ambulatory Visit: Payer: Medicare HMO | Admitting: Internal Medicine

## 2023-08-09 ENCOUNTER — Encounter (HOSPITAL_BASED_OUTPATIENT_CLINIC_OR_DEPARTMENT_OTHER): Payer: Self-pay

## 2023-08-10 NOTE — Telephone Encounter (Addendum)
Called and spoke with pt. Made her aware since last INR was > 3 day ago, it would not be safe to dose Warfarin according to that INR (1.9). I confirmed pt did resume her Warfarin on 12/13 and has been taking her normal dose (10mg  daily except 5mg  on Mondays and Wednesdays). Pt also prescribed Cefdinir 500mg  bid which should not interact with her Warfarin. I instructed pt to continue taking this dose and our anticoagulation clinic will recheck her INR on 08/20/23. Pt verbalized understanding.

## 2023-08-12 ENCOUNTER — Other Ambulatory Visit (HOSPITAL_BASED_OUTPATIENT_CLINIC_OR_DEPARTMENT_OTHER): Payer: Self-pay | Admitting: Family

## 2023-08-17 DIAGNOSIS — F411 Generalized anxiety disorder: Secondary | ICD-10-CM | POA: Insufficient documentation

## 2023-08-20 ENCOUNTER — Ambulatory Visit: Payer: Medicare HMO | Attending: Cardiology | Admitting: Pharmacist

## 2023-08-20 DIAGNOSIS — Z86711 Personal history of pulmonary embolism: Secondary | ICD-10-CM | POA: Diagnosis not present

## 2023-08-20 DIAGNOSIS — Z7901 Long term (current) use of anticoagulants: Secondary | ICD-10-CM | POA: Diagnosis not present

## 2023-08-20 DIAGNOSIS — I82403 Acute embolism and thrombosis of unspecified deep veins of lower extremity, bilateral: Secondary | ICD-10-CM

## 2023-08-20 LAB — POCT INR: INR: 3.2 — AB (ref 2.0–3.0)

## 2023-08-20 NOTE — Patient Instructions (Signed)
Description   Hold dose today and then continue taking 10mg  daily except 5mg  on Mondays and Wednesdays.  Stay consistent with greens each week (1 per week).  Recheck INR in 2 weeks. [facility] [phone]

## 2023-09-03 NOTE — Progress Notes (Deleted)
 Cardiology Clinic Note   Patient Name: Ana Baker Date of Encounter: 09/03/2023  Primary Care Provider:  Joeline Georgeanne DELENA, MD Primary Cardiologist:  Vinie JAYSON Maxcy, MD  Patient Profile    Ana Baker 58 year old female presents the clinic today for follow-up evaluation of her PVCs, hypotension, and PE.  Past Medical History    Past Medical History:  Diagnosis Date   Anemia    Anxiety    DDD (degenerative disc disease), lumbar    DDD (degenerative disc disease), lumbar    Depression    Diabetes mellitus without complication (HCC)    DVT (deep venous thrombosis) (HCC) 04/2010   and PE, in post-operative setting    Endometriosis    GERD (gastroesophageal reflux disease)    History of kidney stones    History of pulmonary embolism    Hypertension    Idiopathic parathyroidism (HCC)    Migraines    OA (osteoarthritis)    Obesity    Osteopenia    Osteoporosis    Pneumonia    x2   PONV (postoperative nausea and vomiting)    Spinal stenosis    Venous reflux    left leg   Vitamin D deficiency    Past Surgical History:  Procedure Laterality Date   ABDOMINAL HYSTERECTOMY  2008   BOWEL RESECTION  03/22/04   sbo   CHOLECYSTECTOMY OPEN  1990   COLONOSCOPY W/ POLYPECTOMY     GASTRIC BYPASS  07/31/2003   DVT post-op - IV filter   HERNIA REPAIR  08/2004   Internal hernia repair   JOINT REPLACEMENT     KIDNEY STONE SURGERY  98,00   KNEE ARTHROSCOPY Left 06,07   KNEE ARTHROSCOPY Left 12/2009   Dr. Gerome   LIPOMA EXCISION  03/12/04   right arm   NM MYOCAR PERF WALL MOTION  04/2010   dipyridamole; normal pattern of perfusion to all regions; EF 68%; low risk study    SALPINGOOPHORECTOMY Left    SPINE SURGERY  2009   TOTAL KNEE ARTHROPLASTY Right 82,98,99,00   DVT & PE post-op 1999   TOTAL KNEE ARTHROPLASTY Left 12/09/2019   Procedure: TOTAL KNEE ARTHROPLASTY;  Surgeon: Gerome Charleston, MD;  Location: [facility];  Service: Orthopedics;  Laterality: Left;  adductor canal    TRANSTHORACIC ECHOCARDIOGRAM  03/2012   EF=>55%, trace MR & TR, normal RSVP    Allergies  Allergies  Allergen Reactions   Almond Oil Anaphylaxis and Rash    ALL ALMONDS!   Nsaids Nausea Only   Adhesive [Tape] Hives and Rash    Burns skin   Penicillins Rash    Did it involve swelling of the face/tongue/throat, SOB, or low BP? No Did it involve sudden or severe rash/hives, skin peeling, or any reaction on the inside of your mouth or nose? Yes Did you need to seek medical attention at a hospital or doctor's office? Was in the hospital when it happend When did it last happen?      21 years ago If all above answers are "NO", may proceed with cephalosporin use.    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  She also has a family history of heart failure in her grandmother and father.  She was seen in the emergency department 7/22 with chest pain.  She noted that it was in the central to right side and worse with activity.  Her EKG showed no acute ST changes.  Chest x-ray showed mild bronchial changes.  Her D-dimer and troponins were unremarkable.  She was seen in follow-up on 03/20/2021.  She reported 1 week of chest discomfort that was radiating from the right to left area of her chest.  She describes this as pressure.  Due to her hypertension her candesartan -HCTZ was transition to HCTZ alone.  She underwent cardiac CTA 03/28/2021 which showed a coronary calcium  score of 185.  She was noted to have mild mixed density plaque 25-49% in her mid LAD and minimal calcified plaque in her mid and distal RCA which were less than 25%.  Medical management was recommended.  She was started on rosuvastatin  20 mg daily.  She followed up 04/15/2021.  She noted chest discomfort with exertion and  dyspnea on exertion.  She was started on low-dose  metoprolol  for antianginal benefit.  This was increased to 25 mg daily with improvement in her symptoms.  Clinic visit 10/22 she reported that she was recovering from COVID infection the prior month.  Her dyspnea on exertion continued to improve with metoprolol .  She was seen in follow-up 12/27/2021.  During that time her rosuvastatin  was transition to a atorvastatin  due to cost.  Her Xarelto  she received with patient assistance.  She followed up 11-23 and was referred to start in the prep exercise program.  Her LDL was less than 70 and at goal.  She underwent robotic ileectomy 5/24 for 15 mm polyp in her ileocecal valve.  She was seen in follow-up by Reche Finder, NP on 02/25/2023.  She reported that she was enjoying her spare time crocheting.  She had completed the exercise program through prep and enjoyed this.  She was planning to return to the [facility] when she had clearance from her surgeon.  She had successfully lost about 30 pounds with diet and exercise.  She did note some episodes of lightheadedness with position changes.  She was planning to see urology for evaluation of her kidney stones.  Her PCP had recommended Flomax.  She was concerned about passing the stones due to size.  She denied shortness of breath and dyspnea on exertion.  She reported no chest discomfort or tightness.  She denied lower extremity swelling orthopnea and PND.  She denied palpitations.  She presents to the clinic today for follow-up evaluation and states***.  *** denies chest pain, shortness of breath, lower extremity edema, fatigue, palpitations, melena, hematuria, hemoptysis, diaphoresis, weakness, presyncope, syncope, orthopnea, and PND.  Essential hypertension-BP today***.  Previously noted to have hypotension and was taken off candesartan -HCTZ.  Later she was placed on metoprolol  and noted benefit with her breathing and chest discomfort. Continue metoprolol   Nonobstructive CAD-no chest pain today.  Denies recent  episodes of arm neck back or chest discomfort.  Symptoms previously improved with uptitration of metoprolol .  Had coronary CTA 8/22 which showed moderate nonobstructive plaque. Continue atorvastatin , metoprolol  Heart healthy low-sodium diet  Hyperlipidemia-LDL***. High-fiber diet Continue atorvastatin  Increase physical activity as tolerated  DVT/PE-reports compliance with warfarin.  Denies bleeding issues. Continue current diet Continue warfarin  Obesity-weight today***.  Weight stable.  Previously able to lose greater than 30 pounds with diet modifications and increase physical activity. Continue increase physical activity Maintain weight log  Disposition: Follow-up with Dr. Mona or me in 9-12 months.  Home Medications    Prior to Admission medications   Medication  Sig Start Date End Date Taking? Authorizing Provider  Atogepant (QULIPTA) 60 MG TABS Take 60 tablets by mouth daily. 06/01/23   [provider]  atorvastatin  (LIPITOR) 40 MG tablet TAKE 1 TABLET EVERY DAY 04/30/23   Hilty, Vinie BROCKS, MD  calcium  carbonate (OSCAL) 1500 (600 Ca) MG TABS tablet Take by mouth 2 (two) times daily with a meal.    [provider]  cholecalciferol (VITAMIN D3) 25 MCG (1000 UNIT) tablet Take 1,000 Units by mouth daily.    [provider]  famotidine  (PEPCID ) 40 MG tablet TAKE 1 TABLET AT BEDTIME 08/12/23   Walker, Caitlin S, NP  FLUoxetine  (PROZAC ) 40 MG capsule Take 40 mg daily by mouth.    [provider]  gabapentin  (NEURONTIN ) 300 MG capsule Take 300 mg by mouth 3 (three) times daily. Take one tablet 300mg  by mouth in the morning, and in the afternoon, take 2 tablets 600mg  at bedtime.    [provider]  HYDROcodone -acetaminophen  (NORCO) 7.5-325 MG tablet Take 1 tablet by mouth 3 (three) times daily as needed. 11/13/21   [provider]  influenza vac split quadrivalent PF (FLUARIX) 0.5 ML injection Inject into the muscle. 06/27/22   Luiz Channel, MD  l-methylfolate-B6-B12 (METANX) 3-35-2 MG TABS tablet Take 1 tablet by mouth daily.    [provider]  lubiprostone  (AMITIZA ) 24 MCG capsule Take 1 capsule (24 mcg total) by mouth 2 times daily with meals. 03/14/22     Methylcobalamin 1 MG CHEW Chew by mouth.    [provider]  metoprolol  succinate (TOPROL -XL) 25 MG 24 hr tablet TAKE 1 TABLET EVERY DAY 07/17/23   Walker, Caitlin S, NP  morphine  (MS CONTIN ) 15 MG 12 hr tablet Take 15 mg by mouth 3 (three) times daily.  09/14/13   [provider]  nitroGLYCERIN  (NITROSTAT ) 0.4 MG SL tablet Place 1 tablet (0.4 mg total) under the tongue every 5 (five) minutes as needed for chest pain. 06/27/22 09/25/22  Walker, Caitlin S, NP  pantoprazole  (PROTONIX ) 40 MG tablet TAKE 1 TABLET TWICE DAILY 06/17/23   Walker, Caitlin S, NP  Pediatric Multivitamins-Iron (CHILDRENS MULTIVITAMIN/IRON PO) Take 1 tablet by mouth daily. Flinstones    [provider]  Pyridoxal-5 Phosphate 100 MG/ML SOLN Inject as directed.    [provider]  SUMAtriptan  (IMITREX ) 100 MG tablet Take 1 tablet (100 mg total) by mouth every 2 (two) hours as needed for migraine. 10/27/13   Zanard, Robyn K, MD  warfarin (COUMADIN ) 5 MG tablet Take 1 tablet to 2 tablets by mouth daily as directed by coumadin  clinic 07/10/23   Mona Vinie BROCKS, MD    Family History    Family History  Problem Relation Age of Onset   Heart failure Father    Heart disease Father    Diabetes Mother    Hypertension Mother    Heart disease Mother        CHF   Hypertension Sister    Colon cancer Maternal Aunt    Lung cancer Maternal Uncle    Lung cancer Paternal Uncle    Lymphoma Maternal Grandfather    Heart failure Maternal Grandmother    Diabetes Paternal Grandfather    Heart Problems Paternal Grandfather    Alzheimer's disease Paternal Grandmother    She indicated that her mother is alive. She indicated that her father is deceased. She indicated that  her sister is alive. She indicated that the status of her maternal grandmother is unknown. She indicated  that her maternal grandfather is deceased. She indicated that the status of her paternal grandmother is unknown. She indicated that the status of her paternal grandfather is unknown. She indicated that her maternal aunt is deceased. She indicated that her maternal uncle is deceased. She indicated that her paternal uncle is deceased.  Social History    Social History   Socioeconomic History   Marital status: Single    Spouse name: Not on file   Number of children: Not on file   Years of education: 16   Highest education level: Not on file  Occupational History    Employer: [employer]  Tobacco Use   Smoking status: Never   Smokeless tobacco: Never  Vaping Use   Vaping status: Never Used  Substance and Sexual Activity   Alcohol use: Not Currently   Drug use: No   Sexual activity: Not Currently    Birth control/protection: Surgical  Other Topics Concern   Not on file  Social History Narrative   Marital Status:  Single   Living Situation: Lives with mother Lesley)   Occupation: Arboriculturist   Education:  Engineer, Maintenance (it) (BS in Chief Of Staff)   Tobacco Use/Exposure: None   Alcohol Use:  None   Drug Use:  None   Diet:  Low Carb/Low Fat   Exercise:  Limited   Hobbies:  Programmer, Systems, Reading   Social Drivers of Corporate Investment Banker Strain: Low Risk  (02/11/2023)   Received from [facility], [facility]   Overall Financial Resource Strain (CARDIA)    Difficulty of Paying Living Expenses: Not hard at all  Food Insecurity: No Food Insecurity (08/05/2023)   Received from [facility]   Hunger Vital Sign    Worried About Running Out of Food in the Last Year: Never true    Ran Out of Food in the Last Year: Never true  Transportation Needs: No Transportation Needs (08/05/2023)   Received from [facility] - Transportation     Lack of Transportation (Medical): No    Lack of Transportation (Non-Medical): No  Physical Activity: Insufficiently Active (02/11/2023)   Received from [facility], [facility]   Exercise Vital Sign    Days of Exercise per Week: 2 days    Minutes of Exercise per Session: 30 min  Stress: Stress Concern Present (08/05/2023)   Received from [facility] - Occupational Stress Questionnaire    Feeling of Stress : To some extent  Social Connections: Moderately Integrated (02/11/2023)   Received from [facility], [facility]   Social Network    How would you rate your social network (family, work, friends)?: Adequate participation with social networks  Intimate Partner Violence: Not At Risk (08/05/2023)   Received from [facility]   HITS    Over the last 12 months how often did your partner physically hurt you?: Never    Over the last 12 months how often did your partner insult you or talk down to you?: Never    Over the last 12 months how often did your partner threaten you with physical harm?: Never    Over the last 12 months how often did your partner scream or curse at you?: Never     Review of Systems    General:  No chills, fever, night sweats or weight changes.  Cardiovascular:  No chest pain, dyspnea on exertion, edema, orthopnea, palpitations, paroxysmal nocturnal dyspnea. Dermatological: No rash,  lesions/masses Respiratory: No cough, dyspnea Urologic: No hematuria, dysuria Abdominal:   No nausea, vomiting, diarrhea, bright red blood per rectum, melena, or hematemesis Neurologic:  No visual changes, wkns, changes in mental status. All other systems reviewed and are otherwise negative except as noted above.  Physical Exam    VS:  There were no vitals taken for this visit. , BMI There is no height or weight on file to calculate BMI. GEN: Well nourished, well developed, in no acute distress. HEENT: normal. Neck: Supple, no  JVD, carotid bruits, or masses. Cardiac: RRR, no murmurs, rubs, or gallops. No clubbing, cyanosis, edema.  Radials/DP/PT 2+ and equal bilaterally.  Respiratory:  Respirations regular and unlabored, clear to auscultation bilaterally. GI: Soft, nontender, nondistended, BS + x 4. MS: no deformity or atrophy. Skin: warm and dry, no rash. Neuro:  Strength and sensation are intact. Psych: Normal affect.  Accessory Clinical Findings    Recent Labs: 02/25/2023: Hemoglobin 14.9; Platelets 383   Recent Lipid Panel    Component Value Date/Time   CHOL 208 (H) 05/02/2014 0826   TRIG 107 05/02/2014 0826   HDL 67 05/02/2014 0826   CHOLHDL 3.1 05/02/2014 0826   VLDL 21 05/02/2014 0826   LDLCALC 120 (H) 05/02/2014 0826   LDLDIRECT 64 06/27/2022 1101    No BP recorded.  {Refresh Note OR Click here to enter BP  :1}***    ECG personally reviewed by me today- ***      Coronary CTA 03/28/2021  FINDINGS: Image quality: Excellent.   Noise artifact is: Limited.   Coronary Arteries:  Normal coronary origin.  Right dominance.   Left main: The left main is a large caliber vessel with a normal take off from the left coronary cusp that bifurcates to form a left anterior descending artery and a left circumflex artery. There is no plaque or stenosis.   Left anterior descending artery: The proximal LAD is patent. The mid LAD contains mild mixed density plaque (25-49%). The distal LAD is hypoplastic and the LAD terminates as a second diagonal branch. The first diagonal is small and patent.   Left circumflex artery: The LCX is non-dominant and patent with no evidence of plaque or stenosis. The LCX gives off 1 patent obtuse marginal branch.   Right coronary artery: The RCA is dominant with normal take off from the right coronary cusp. There is minimal calcified plaque (<25%) in the mid and distal RCA. The RCA terminates as a PDA and right posterolateral branch without evidence of plaque or  stenosis.   Right Atrium: Right atrial size is within normal limits.   Right Ventricle: The right ventricular cavity is within normal limits.   Left Atrium: Left atrial size is normal in size with no left atrial appendage filling defect.   Left Ventricle: The ventricular cavity size is within normal limits. There are no stigmata of prior infarction. There is no abnormal filling defect.   Pulmonary arteries: Normal in size without proximal filling defect.   Pulmonary veins: Normal pulmonary venous drainage.   Pericardium: Normal thickness with no significant effusion or calcium  present.   Cardiac valves: The aortic valve is trileaflet without significant calcification. The mitral valve is normal structure without significant calcification.   Aorta: Normal caliber with no significant disease.   Extra-cardiac findings: See attached radiology report for non-cardiac structures.   IMPRESSION: 1. Coronary calcium  score of 185. This was 98th percentile for age-, sex, and race-matched controls.   2. Normal coronary origin with right  dominance.   3. Mild mixed density plaque (25-49%) in the mid LAD.   4. Minimal calcified plaque in the mid and distal RCA (<25%).   RECOMMENDATIONS: 1. Mild non-obstructive CAD (25-49%). Consider non-atherosclerotic causes of chest pain. Consider preventive therapy and risk factor modification.   Darryle Decent, MD   Electronically Signed: By: Darryle Decent    Assessment & Plan   1.  ***   Josefa HERO. Zinia Innocent NP-C     09/03/2023, 7:51 AM [facility] [address] Office [phone] Fax [phone]    I spent***minutes examining this patient, reviewing medications, and using patient centered shared decision making involving their cardiac care.   I spent greater than 20 minutes reviewing their past medical history,  medications, and prior cardiac tests.

## 2023-09-04 ENCOUNTER — Ambulatory Visit: Payer: Medicare HMO | Attending: Internal Medicine

## 2023-09-04 DIAGNOSIS — Z86711 Personal history of pulmonary embolism: Secondary | ICD-10-CM | POA: Diagnosis not present

## 2023-09-04 DIAGNOSIS — Z7901 Long term (current) use of anticoagulants: Secondary | ICD-10-CM

## 2023-09-04 LAB — POCT INR: INR: 2.6 (ref 2.0–3.0)

## 2023-09-04 NOTE — Patient Instructions (Signed)
 continue taking 10mg  daily except 5mg  on Mondays and Wednesdays.  Stay consistent with greens each week (1 per week).  Recheck INR in 4 weeks. Coumadin Clinic [phone]

## 2023-09-07 ENCOUNTER — Ambulatory Visit: Payer: Medicare HMO | Admitting: General Practice

## 2023-09-10 ENCOUNTER — Telehealth: Payer: Self-pay

## 2023-09-10 NOTE — Telephone Encounter (Signed)
Received call from pt stating she fell last night. Pt denies hitting her head. Pt states she only has a bruise on her right leg above her knee. I advised pt to monitor closely. If she experiences any signs or symptoms of bleeding or if the bruise gets bigger in size she should seek immediate medical attention. Pt verbalized understanding.

## 2023-09-13 ENCOUNTER — Emergency Department (HOSPITAL_COMMUNITY): Payer: Medicare HMO

## 2023-09-13 ENCOUNTER — Encounter (HOSPITAL_COMMUNITY): Payer: Self-pay

## 2023-09-13 ENCOUNTER — Other Ambulatory Visit: Payer: Self-pay

## 2023-09-13 ENCOUNTER — Emergency Department (HOSPITAL_COMMUNITY)
Admission: EM | Admit: 2023-09-13 | Discharge: 2023-09-13 | Disposition: A | Discharge status: Home / Self Care | Payer: Medicare HMO | Attending: Emergency Medicine | Admitting: Emergency Medicine

## 2023-09-13 DIAGNOSIS — S0990XA Unspecified injury of head, initial encounter: Secondary | ICD-10-CM | POA: Diagnosis present

## 2023-09-13 DIAGNOSIS — W11XXXA Fall on and from ladder, initial encounter: Secondary | ICD-10-CM | POA: Insufficient documentation

## 2023-09-13 DIAGNOSIS — Z7901 Long term (current) use of anticoagulants: Secondary | ICD-10-CM | POA: Diagnosis not present

## 2023-09-13 DIAGNOSIS — I700 Atherosclerosis of aorta: Secondary | ICD-10-CM | POA: Insufficient documentation

## 2023-09-13 DIAGNOSIS — M25551 Pain in right hip: Secondary | ICD-10-CM | POA: Diagnosis present

## 2023-09-13 DIAGNOSIS — I2510 Atherosclerotic heart disease of native coronary artery without angina pectoris: Secondary | ICD-10-CM | POA: Insufficient documentation

## 2023-09-13 DIAGNOSIS — M79604 Pain in right leg: Secondary | ICD-10-CM

## 2023-09-13 DIAGNOSIS — M549 Dorsalgia, unspecified: Secondary | ICD-10-CM | POA: Diagnosis present

## 2023-09-13 DIAGNOSIS — W19XXXA Unspecified fall, initial encounter: Secondary | ICD-10-CM

## 2023-09-13 LAB — CBC
HCT: 41.3 % (ref 36.0–46.0)
Hemoglobin: 13.6 g/dL (ref 12.0–15.0)
MCH: 31.3 pg (ref 26.0–34.0)
MCHC: 32.9 g/dL (ref 30.0–36.0)
MCV: 95.2 fL (ref 80.0–100.0)
Platelets: 246 10*3/uL (ref 150–400)
RBC: 4.34 MIL/uL (ref 3.87–5.11)
RDW: 14 % (ref 11.5–15.5)
WBC: 5.2 10*3/uL (ref 4.0–10.5)
nRBC: 0 % (ref 0.0–0.2)

## 2023-09-13 LAB — BASIC METABOLIC PANEL
Anion gap: 10 (ref 5–15)
BUN: 15 mg/dL (ref 6–20)
CO2: 22 mmol/L (ref 22–32)
Calcium: 8.7 mg/dL — ABNORMAL LOW (ref 8.9–10.3)
Chloride: 108 mmol/L (ref 98–111)
Creatinine, Ser: 0.92 mg/dL (ref 0.44–1.00)
GFR, Estimated: >60 mL/min (ref >60)
Glucose, Bld: 221 mg/dL — ABNORMAL HIGH (ref 70–99)
Potassium: 3.6 mmol/L (ref 3.5–5.1)
Sodium: 140 mmol/L (ref 135–145)

## 2023-09-13 LAB — PROTIME-INR
INR: 2.8 — ABNORMAL HIGH (ref 0.8–1.2)
Prothrombin Time: 30.1 s — ABNORMAL HIGH (ref 11.4–15.2)

## 2023-09-13 MED ORDER — IOHEXOL 300 MG/ML  SOLN
100.0000 mL | Freq: Once | INTRAMUSCULAR | Status: AC | PRN
  Administered 2023-09-13: 100 mL via INTRAVENOUS

## 2023-09-13 NOTE — ED Provider Notes (Signed)
[facility] Provider Note   CSN: [id number] Arrival date & time: 09/13/23  8756     History  Chief Complaint  Patient presents with   Ana Baker is a 58 y.o. female.  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  States that she fell off of the second rung of a ladder.  She landed on her right side.  She does denies striking her head or having neck injury.  She has ongoing back pain and has previously burned her back on a heating pad.  She is in pain management has chronic pain in her low back.  She states initially she had bruised her knee and had pain in her knee but has had worsening pain with her hip with trying to walk.    Home Medications Prior to Admission medications   Medication Sig Start Date End Date Taking? Authorizing Provider  Atogepant (QULIPTA) 60 MG TABS Take 60 tablets by mouth daily. 06/01/23   [provider]  atorvastatin (LIPITOR) 40 MG tablet TAKE 1 TABLET EVERY DAY 04/30/23   Hilty, Lisette Abu, MD  calcium carbonate (OSCAL) 1500 (600 Ca) MG TABS tablet Take by mouth 2 (two) times daily with a meal.    [provider]  cholecalciferol (VITAMIN D3) 25 MCG (1000 UNIT) tablet Take 1,000 Units by mouth daily.    [provider]  famotidine (PEPCID) 40 MG tablet TAKE 1 TABLET AT BEDTIME 08/12/23   Alver Sorrow, NP  FLUoxetine (PROZAC) 40 MG capsule Take 40 mg daily by mouth.    [provider]  gabapentin (NEURONTIN) 300 MG capsule Take 300 mg by mouth 3 (three) times daily. Take one tablet 300mg  by mouth in the morning, and in the afternoon, take 2 tablets 600mg  at bedtime.    [provider]  HYDROcodone-acetaminophen (NORCO) 7.5-325 MG tablet Take 1 tablet by mouth 3 (three) times daily as needed. 11/13/21    [provider]  influenza vac split quadrivalent PF (FLUARIX) 0.5 ML injection Inject into the muscle. 06/27/22   Judyann Munson, MD  l-methylfolate-B6-B12 (METANX) 3-35-2 MG TABS tablet Take 1 tablet by mouth daily.    [provider]  lubiprostone (AMITIZA) 24 MCG capsule Take 1 capsule (24 mcg total) by mouth 2 times daily with meals. 03/14/22     Methylcobalamin 1 MG CHEW Chew by mouth.    [provider]  metoprolol succinate (TOPROL-XL) 25 MG 24 hr tablet TAKE 1 TABLET EVERY DAY 07/17/23   Alver Sorrow, NP  morphine (MS CONTIN) 15 MG 12 hr tablet Take 15 mg by mouth 3 (three) times daily.  09/14/13   [provider]  nitroGLYCERIN (NITROSTAT) 0.4 MG SL tablet Place 1 tablet (0.4 mg total) under the tongue every 5 (five) minutes as needed for chest pain. 06/27/22 09/25/22  Alver Sorrow, NP  pantoprazole (PROTONIX) 40 MG tablet TAKE 1 TABLET TWICE DAILY 06/17/23   Alver Sorrow, NP  Pediatric Multivitamins-Iron (CHILDRENS MULTIVITAMIN/IRON PO) Take 1 tablet by mouth daily. Flinstones    [provider]  Pyridoxal-5 Phosphate 100 MG/ML SOLN Inject as directed.    [provider]  SUMAtriptan (IMITREX) 100 MG tablet Take 1 tablet (100 mg total) by mouth every 2 (two) hours as needed for migraine.  10/27/13   Gillian Scarce, MD  warfarin (COUMADIN) 5 MG tablet Take 1 tablet to 2 tablets by mouth daily as directed by coumadin clinic 07/10/23   Chrystie Nose, MD      Allergies    Almond oil, Nsaids, Adhesive [tape], Penicillins, Tolmetin, and Vancomycin    Review of Systems   Review of Systems  Physical Exam Updated Vital Signs BP 111/61 (BP Location: Right Arm)   Pulse 95   Temp 98.2 F (36.8 C) (Oral)   Resp 16   Ht 1.727 m (5\' 8" )   Wt 119.5 kg   SpO2 100%   BMI 40.06 kg/m  Physical Exam  ED Results / Procedures / Treatments   Labs (all labs ordered are listed, but only abnormal results are displayed) Labs  Reviewed  BASIC METABOLIC PANEL - Abnormal; Notable for the following components:      Result Value   Glucose, Bld 221 (*)    Calcium 8.7 (*)    All other components within normal limits  PROTIME-INR - Abnormal; Notable for the following components:   Prothrombin Time 30.1 (*)    INR 2.8 (*)    All other components within normal limits  CBC    EKG None  Radiology CT Head Wo Contrast Result Date: 09/13/2023 CLINICAL DATA:  Neck trauma EXAM: CT HEAD WITHOUT CONTRAST CT CERVICAL SPINE WITHOUT CONTRAST TECHNIQUE: Multidetector CT imaging of the head and cervical spine was performed following the standard protocol without intravenous contrast. Multiplanar CT image reconstructions of the cervical spine were also generated. RADIATION DOSE REDUCTION: This exam was performed according to the departmental dose-optimization program which includes automated exposure control, adjustment of the mA and/or kV according to patient size and/or use of iterative reconstruction technique. COMPARISON:  12/17/14 FINDINGS: CT HEAD FINDINGS Brain: No evidence of acute infarction, hemorrhage, hydrocephalus, extra-axial collection or mass lesion/mass effect. Low cerebellar tonsils projecting at least 5 mm below the foramen magnum without convincing foramen magnum impingement. Vascular: No hyperdense vessel or unexpected calcification. Skull: Normal. Negative for fracture or focal lesion. Sinuses/Orbits: Right maxillary sinus opacification, incidental based on the trauma history. CT CERVICAL SPINE FINDINGS Alignment: Normal. Skull base and vertebrae: No acute fracture. No primary bone lesion or focal pathologic process. Soft tissues and spinal canal: No prevertebral fluid or swelling. No visible canal hematoma. Disc levels:  No significant degenerative change Upper chest: No evidence of injury IMPRESSION: 1. No evidence of acute intracranial or cervical spine injury. 2. Low right cerebellar tonsil, more compatible with ectopia  than Chiari 1 malformation. Correlate for chronic headaches. 3. Opacified right maxillary sinus. Electronically Signed   By: Tiburcio Pea M.D.   On: 09/13/2023 12:06   CT Cervical Spine Wo Contrast Result Date: 09/13/2023 CLINICAL DATA:  Neck trauma EXAM: CT HEAD WITHOUT CONTRAST CT CERVICAL SPINE WITHOUT CONTRAST TECHNIQUE: Multidetector CT imaging of the head and cervical spine was performed following the standard protocol without intravenous contrast. Multiplanar CT image reconstructions of the cervical spine were also generated. RADIATION DOSE REDUCTION: This exam was performed according to the departmental dose-optimization program which includes automated exposure control, adjustment of the mA and/or kV according to patient size and/or use of iterative reconstruction technique. COMPARISON:  12/17/14 FINDINGS: CT HEAD FINDINGS Brain: No evidence of acute infarction, hemorrhage, hydrocephalus, extra-axial collection or mass lesion/mass effect. Low cerebellar tonsils projecting at least 5 mm below the foramen magnum without convincing foramen magnum impingement. Vascular: No hyperdense vessel or unexpected calcification. Skull: Normal. Negative  for fracture or focal lesion. Sinuses/Orbits: Right maxillary sinus opacification, incidental based on the trauma history. CT CERVICAL SPINE FINDINGS Alignment: Normal. Skull base and vertebrae: No acute fracture. No primary bone lesion or focal pathologic process. Soft tissues and spinal canal: No prevertebral fluid or swelling. No visible canal hematoma. Disc levels:  No significant degenerative change Upper chest: No evidence of injury IMPRESSION: 1. No evidence of acute intracranial or cervical spine injury. 2. Low right cerebellar tonsil, more compatible with ectopia than Chiari 1 malformation. Correlate for chronic headaches. 3. Opacified right maxillary sinus. Electronically Signed   By: Tiburcio Pea M.D.   On: 09/13/2023 12:06   CT CHEST ABDOMEN PELVIS W  CONTRAST Result Date: 09/13/2023 CLINICAL DATA:  Larey Seat off of ladder 4 days ago. Back pain. Patient on Coumadin. EXAM: CT CHEST, ABDOMEN, AND PELVIS WITH CONTRAST TECHNIQUE: Multidetector CT imaging of the chest, abdomen and pelvis was performed following the standard protocol during bolus administration of intravenous contrast. RADIATION DOSE REDUCTION: This exam was performed according to the departmental dose-optimization program which includes automated exposure control, adjustment of the mA and/or kV according to patient size and/or use of iterative reconstruction technique. CONTRAST:  OMNIPAQUE IOHEXOL 300 MG/ML  SOLN COMPARISON:  01/15/2023 FINDINGS: CT CHEST FINDINGS Cardiovascular: Heart normal in size and configuration. Right coronary artery calcifications. No pericardial effusion. Great vessels are unremarkable. Mediastinum/Nodes: No mediastinal hematoma. No neck base, mediastinal or hilar masses. No enlarged lymph nodes. Trachea and esophagus are unremarkable. Lungs/Pleura: Mild linear subsegmental atelectasis, left lower lobe. Minor linear scarring a right upper lobe near the apex. Lungs otherwise clear. No pleural effusion or pneumothorax. Musculoskeletal: No fracture or acute finding. No bone lesion. No chest wall mass. CT ABDOMEN PELVIS FINDINGS Hepatobiliary: No focal liver abnormality is seen. Status post cholecystectomy. No biliary dilatation. Pancreas: Unremarkable. No pancreatic ductal dilatation or surrounding inflammatory changes. Spleen: Normal in size without focal abnormality. Adrenals/Urinary Tract: 1.1 cm left adrenal nodule with relative washout of 43% consistent with an adenoma. Normal right adrenal gland. Normal kidneys, ureters and bladder. Stomach/Bowel: Previous gastric bypass no stomach wall thickening or inflammation. Small bowel and colon are normal in caliber. No wall thickening or inflammation. Moderate increase in colonic stool burden. Vascular/Lymphatic: Mild aortic  atherosclerosis. No aneurysm. No enlarged lymph nodes. Reproductive: Status post hysterectomy. No adnexal masses. Other: No abdominal wall hernia or abnormality. No abdominopelvic ascites. Musculoskeletal: No fracture or acute finding.  No bone lesion. IMPRESSION: 1. No acute findings or evidence of injury within the chest, abdomen or pelvis. 2. Coronary artery calcifications. 3. Moderate increase in colonic stool burden. 4. Aortic atherosclerosis. Aortic Atherosclerosis (ICD10-I70.0). Electronically Signed   By: Amie Portland M.D.   On: 09/13/2023 12:02   DG Knee Complete 4 Views Right Result Date: 09/13/2023 CLINICAL DATA:  Larey Seat from ladder 3 days ago.  Pain. EXAM: RIGHT KNEE - COMPLETE 4+ VIEW COMPARISON:  None Available. FINDINGS: Previous total knee replacement. No joint effusion. Components appear well positioned. Soft tissue calcifications associated with the patellar tendon or joint capsule, not related to any acute injury. IMPRESSION: No acute or traumatic finding. Previous total knee replacement. Electronically Signed   By: Paulina Fusi M.D.   On: 09/13/2023 10:42   DG Hip Unilat W or Wo Pelvis 2-3 Views Right Result Date: 09/13/2023 CLINICAL DATA:  Larey Seat from a ladder 3 days ago.  Pain. EXAM: DG HIP (WITH OR WITHOUT PELVIS) 2-3V RIGHT COMPARISON:  None Available. FINDINGS: There is no evidence of hip  fracture or dislocation. There is no evidence of arthropathy or other focal bone abnormality. Mild benign sclerotic change of the right iliac bone of no significance. IMPRESSION: Negative. Electronically Signed   By: Paulina Fusi M.D.   On: 09/13/2023 10:41    Procedures Procedures    Medications Ordered in ED Medications  iohexol (OMNIPAQUE) 300 MG/ML solution 100 mL (100 mLs Intravenous Contrast Given 09/13/23 1130)    ED Course/ Medical Decision Making/ A&P Clinical Course as of 09/13/23 1220  Sun Sep 13, 2023  1213 CT head reviewed and interpreted and no evidence of acute intracranial  or cervical spine injury [DR]  1213 Chest abdomen and pelvis reviewed and interpreted no acute findings or evidence of injury within the chest abdomen or pelvis [DR]  1213 Right hip x-Trea Carnegie reviewed interpreted no evidence acute abnormality noted radiologist interpretation concurs [DR]  1214 Right knee x-Efrat Zuidema reviewed interpreted no evidence of acute or trauma finding noted [DR]  1214 INR stable and therapeutic at 2.8 CBC within normal limits Met significant for mild hyperglycemia glucose of 221 [DR]    Clinical Course User Index [DR] Margarita Grizzle, MD                                 Medical Decision Making Amount and/or Complexity of Data Reviewed Labs: ordered. Radiology: ordered.  Risk Prescription drug management.   58 year old female on Coumadin fell from a ladder several days ago presents today with increased pain in her right lower extremity.  Differential diagnosis includes but is not limited to high risk for intracranial hemorrhage, chest injury, abdomen and pelvis injuries due to mechanism and Coumadin use. Patient was seen and evaluated and had multiple scans of head, neck, chest abdomen pelvis done.  No evidence acute abnormality was noted.  X-Montarius Kitagawa of the right hip and right knee showed no evidence of acute fracture.  Labs are essentially normal with the exception of some hyperglycemia. Patient has remained hemodynamically stable here in the ED.  She states that she is able to walk at home with a cane.  She has pain medicine at home.  We have discussed return precautions need for follow-up and future avoidance of high risk mechanisms of injury based on her chronic Coumadin use.       Final Clinical Impression(s) / ED Diagnoses Final diagnoses:  Fall, initial encounter  Chronic anticoagulation  Pain of right lower extremity    Rx / DC Orders ED Discharge Orders     None         Margarita Grizzle, MD 09/13/23 1220

## 2023-09-13 NOTE — ED Notes (Signed)
Patient walks to the restroom with the assistance of her cane.

## 2023-09-13 NOTE — Discharge Instructions (Addendum)
Your evaluated today for a fall. Your INR is therapeutic at 2.8 CT of head, chest, neck, abdomen pelvis did not show any evidence of acute injury. X-rays of your right hip and right knee did not show any evidence of broken bones. Please use your cane and did not engage in high risk activity as such as getting up on a ladder. Return if you are having any new or worsening symptoms.  Please follow-up with your doctor this week

## 2023-09-13 NOTE — ED Triage Notes (Signed)
Pt fell off of a ladder on Thursday, bruising her right thigh and the middle of her back. Pt is able to walk with pain. Pt on Coumadin, denies LOC or hitting her head.

## 2023-09-17 ENCOUNTER — Encounter: Payer: Self-pay | Admitting: Internal Medicine

## 2023-09-17 ENCOUNTER — Ambulatory Visit: Payer: Medicare HMO | Attending: Internal Medicine | Admitting: Internal Medicine

## 2023-09-17 VITALS — BP 106/76 | HR 80 | Ht 67.0 in | Wt 274.6 lb

## 2023-09-17 DIAGNOSIS — Z7901 Long term (current) use of anticoagulants: Secondary | ICD-10-CM | POA: Diagnosis not present

## 2023-09-17 DIAGNOSIS — E119 Type 2 diabetes mellitus without complications: Secondary | ICD-10-CM

## 2023-09-17 DIAGNOSIS — I10 Essential (primary) hypertension: Secondary | ICD-10-CM

## 2023-09-17 DIAGNOSIS — Z86718 Personal history of other venous thrombosis and embolism: Secondary | ICD-10-CM

## 2023-09-17 NOTE — Patient Instructions (Signed)
Medication Instructions:  Your physician recommends that you continue on your current medications as directed. Please refer to the Current Medication list given to you today.    *If you need a refill on your cardiac medications before your next appointment, please call your pharmacy*   Lab Work: None    If you have labs (blood work) drawn today and your tests are completely normal, you will receive your results only by: MyChart Message (if you have MyChart) OR A paper copy in the mail If you have any lab test that is abnormal or we need to change your treatment, we will call you to review the results.   Testing/Procedures: None    Follow-Up: At [facility], you and your health needs are our priority.  As part of our continuing mission to provide you with exceptional heart care, we have created designated Provider Care Teams.  These Care Teams include your primary Cardiologist (physician) and Advanced Practice Providers (APPs -  Physician Assistants and Nurse Practitioners) who all work together to provide you with the care you need, when you need it.  We recommend signing up for the patient portal called "MyChart".  Sign up information is provided on this After Visit Summary.  MyChart is used to connect with patients for Virtual Visits (Telemedicine).  Patients are able to view lab/test results, encounter notes, upcoming appointments, etc.  Non-urgent messages can be sent to your provider as well.   To learn more about what you can do with MyChart, go to [email or web address].    Your next appointment:   1 year(s)  The format for your next appointment:   In Person  Provider:   Chrystie Nose, MD    Other Instructions

## 2023-09-17 NOTE — Progress Notes (Signed)
OFFICE NOTE  Chief Complaint:  Follow-up  Primary Care Physician: Feliberto Gottron, MD  HPI:  Ana Baker is a pleasant 58 year old female presenting followed by Dr. Alanda Amass. Unfortunately she has a history of morbid obesity and is status post gastric bypass surgery. She's had a number of both knee surgeries and lumbar surgeries with a history of lumbar stenosis. In the past she developed a DVT after her first surgery in 1999 and then had pulmonary emboli. She was treated on warfarin for this but then had problems with kidney stones and then developed significant bleeding. After surgery in 2004 she had an IVC filter placed and was removed. She also had recurrent DVT in 2011. She was then switched from warfarin over 2 Xarelto. So far Xarelto has been working well for her and a prior discussion with Dr. Alanda Amass indicated that she may be able to come off of this, despite the fact that she's had 2 episodes of DVT and pulmonary emboli in the past. Today she wishes to discuss management of her Xarelto in the setting of possible spinal injections. She continues to have problems with low back pain and numbness and tingling in her right leg. There is a recommendation for injection however she will need to be off of Xarelto for this. Other than low back pain she has no other complaints.  Ana Baker returns today for followup. Overall she has no complaints. She reports that her primary care provider is going to leave of absence and moving out of the [facility] health system. She is interested in getting laboratory work today and enrolling in a health and wellness program through her work, they are requiring laboratory work within the past year. The last laboratory work that I have is from September of last year therefore she is due for a recheck.  I saw Ana Baker today back for an annual follow-up. Generally she is doing well with regards to anticoagulation. She's had no recurrent DVT or PE. She is tolerating  Xarelto without any bleeding difficulty. She continues to see Dr. Ethelene Hal for back pain. Unfortunately there are few options for her. She is ready had surgery and now is on long-acting morphine and Vicodin for pain control.  11/24/2016  Ana Baker returns today for follow-up of her DVT. She's not had any recurrence and remains on Xarelto. She had had previous testing for clotting disorders in the past without a real identification of the etiology of her recurrent thrombosis. She says there is a family history of clot formation. She is wondering if she could ever come off of her anticoagulation.  07/03/2020  Ana Baker is seen today in follow-up.  I last saw her via telemedicine visit in 2020.  More recently she had surgery.  She seems to be doing well from that.  Her Xarelto dose has been decreased by myself down to 10 mg for maintenance.  She had no issues of clots or any thing related to her surgery.  Blood pressure is well controlled.  She did have a recent CT scan that showed some aortic atherosclerosis.  Her PCP follows her cholesterol but she is not on any treatment for that.  Continues to work on weight loss.  09/17/2023  Ana Baker is seen today for follow-up.  Unfortunately she had a recent fall.  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  She had some bruising on her leg but fortunately no fracture.  She has transitioned  over to warfarin once she reached Medicare due to cost issues.  Overall her INR has been fairly stable.  She is being assessed every 4 weeks.  Her INR was 2.8 recently when she was in the ER.  Blood pressure is low normal today however recently was lower in the 80s systolic.  She was taken off of HCTZ by Gillian Shields, NP and is feeling less dizzy.  She is on Ozempic and has noted some weight loss as well as improvement in her blood sugars.  PMHx:  Past Medical History:  Diagnosis Date   Anemia    Anxiety    DDD (degenerative disc disease), lumbar     DDD (degenerative disc disease), lumbar    Depression    Diabetes mellitus without complication (HCC)    DVT (deep venous thrombosis) (HCC) 04/2010   and PE, in post-operative setting    Endometriosis    GERD (gastroesophageal reflux disease)    History of kidney stones    History of pulmonary embolism    Hypertension    Idiopathic parathyroidism (HCC)    Migraines    OA (osteoarthritis)    Obesity    Osteopenia    Osteoporosis    Pneumonia    x2   PONV (postoperative nausea and vomiting)    Spinal stenosis    Venous reflux    left leg   Vitamin D deficiency     Past Surgical History:  Procedure Laterality Date   ABDOMINAL HYSTERECTOMY  2008   BOWEL RESECTION  03/22/04   sbo   CHOLECYSTECTOMY OPEN  1990   COLONOSCOPY W/ POLYPECTOMY     GASTRIC BYPASS  07/31/2003   DVT post-op - IV filter   HERNIA REPAIR  08/2004   Internal hernia repair   JOINT REPLACEMENT     KIDNEY STONE SURGERY  98,00   KNEE ARTHROSCOPY Left 06,07   KNEE ARTHROSCOPY Left 12/2009   Dr. Thomasena Edis   LIPOMA EXCISION  03/12/04   right arm   NM MYOCAR PERF WALL MOTION  04/2010   dipyridamole; normal pattern of perfusion to all regions; EF 68%; low risk study    SALPINGOOPHORECTOMY Left    SPINE SURGERY  2009   TOTAL KNEE ARTHROPLASTY Right 82,98,99,00   DVT & PE post-op 1999   TOTAL KNEE ARTHROPLASTY Left 12/09/2019   Procedure: TOTAL KNEE ARTHROPLASTY;  Surgeon: Eugenia Mcalpine, MD;  Location: [facility];  Service: Orthopedics;  Laterality: Left;  adductor canal   TRANSTHORACIC ECHOCARDIOGRAM  03/2012   EF=>55%, trace MR & TR, normal RSVP    FAMHx:  Family History  Problem Relation Age of Onset   Heart failure Father    Heart disease Father    Diabetes Mother    Hypertension Mother    Heart disease Mother        CHF   Hypertension Sister    Colon cancer Maternal Aunt    Lung cancer Maternal Uncle    Lung cancer Paternal Uncle    Lymphoma Maternal Grandfather    Heart failure Maternal Grandmother     Diabetes Paternal Grandfather    Heart Problems Paternal Grandfather    Alzheimer's disease Paternal Grandmother     SOCHx:   reports that she has never smoked. She has never used smokeless tobacco. She reports that she does not currently use alcohol. She reports that she does not use drugs.  ALLERGIES:  Allergies  Allergen Reactions   Almond Oil Anaphylaxis and Rash    ALL  ALMONDS!   Nsaids Nausea Only   Adhesive [Tape] Hives and Rash    Burns skin   Penicillins Rash    Did it involve swelling of the face/tongue/throat, SOB, or low BP? No Did it involve sudden or severe rash/hives, skin peeling, or any reaction on the inside of your mouth or nose? Yes Did you need to seek medical attention at a hospital or doctor's office? Was in the hospital when it happend When did it last happen?      21 years ago If all above answers are "NO", may proceed with cephalosporin use.    Tolmetin Nausea Only   Vancomycin Rash    ROS: Pertinent items noted in HPI and remainder of comprehensive ROS otherwise negative.  HOME MEDS: Current Outpatient Medications  Medication Sig Dispense Refill   Atogepant (QULIPTA) 60 MG TABS Take 60 tablets by mouth daily.     atorvastatin (LIPITOR) 40 MG tablet TAKE 1 TABLET EVERY DAY 90 tablet 3   calcium carbonate (OSCAL) 1500 (600 Ca) MG TABS tablet Take by mouth 2 (two) times daily with a meal.     cholecalciferol (VITAMIN D3) 25 MCG (1000 UNIT) tablet Take 1,000 Units by mouth daily.     famotidine (PEPCID) 40 MG tablet TAKE 1 TABLET AT BEDTIME 90 tablet 3   FLUoxetine (PROZAC) 40 MG capsule Take 40 mg daily by mouth.     gabapentin (NEURONTIN) 300 MG capsule Take 300 mg by mouth 3 (three) times daily. Take one tablet 300mg  by mouth in the morning, and in the afternoon, take 2 tablets 600mg  at bedtime.     HYDROcodone-acetaminophen (NORCO) 7.5-325 MG tablet Take 1 tablet by mouth 3 (three) times daily as needed.     influenza vac split quadrivalent PF  (FLUARIX) 0.5 ML injection Inject into the muscle. 0.5 mL 0   l-methylfolate-B6-B12 (METANX) 3-35-2 MG TABS tablet Take 1 tablet by mouth daily.     lubiprostone (AMITIZA) 24 MCG capsule Take 1 capsule (24 mcg total) by mouth 2 times daily with meals. 180 capsule 3   Methylcobalamin 1 MG CHEW Chew by mouth.     metoprolol succinate (TOPROL-XL) 25 MG 24 hr tablet TAKE 1 TABLET EVERY DAY 90 tablet 1   morphine (MS CONTIN) 15 MG 12 hr tablet Take 15 mg by mouth 3 (three) times daily.      pantoprazole (PROTONIX) 40 MG tablet TAKE 1 TABLET TWICE DAILY 180 tablet 1   Semaglutide,0.25 or 0.5MG /DOS, (OZEMPIC, 0.25 OR 0.5 MG/DOSE,) 2 MG/3ML SOPN Inject 0.5 mg as directed once a week.     SUMAtriptan (IMITREX) 100 MG tablet Take 1 tablet (100 mg total) by mouth every 2 (two) hours as needed for migraine. 27 tablet 3   warfarin (COUMADIN) 5 MG tablet Take 1 tablet to 2 tablets by mouth daily as directed by coumadin clinic 150 tablet 0   nitroGLYCERIN (NITROSTAT) 0.4 MG SL tablet Place 1 tablet (0.4 mg total) under the tongue every 5 (five) minutes as needed for chest pain. 30 tablet 2   Pediatric Multivitamins-Iron (CHILDRENS MULTIVITAMIN/IRON PO) Take 1 tablet by mouth daily. Flinstones (Patient not taking: Reported on 09/17/2023)     Pyridoxal-5 Phosphate 100 MG/ML SOLN Inject as directed. (Patient not taking: Reported on 09/17/2023)     No current facility-administered medications for this visit.    LABS/IMAGING: No results found for this or any previous visit (from the past 48 hours). No results found.  VITALS: BP 106/76 (BP Location: Left  Arm, Patient Position: Sitting)   Pulse 80   Ht 5\' 7"  (1.702 m)   Wt 274 lb 9.6 oz (124.6 kg)   SpO2 98%   BMI 43.01 kg/m   EXAM: General appearance: alert and no distress Neck: no carotid bruit and no JVD Lungs: clear to auscultation bilaterally Heart: regular rate and rhythm, S1, S2 normal, no murmur, click, rub or gallop Abdomen: soft, non-tender;  bowel sounds normal; no masses,  no organomegaly Extremities: extremities normal, atraumatic, no cyanosis or edema Pulses: 2+ and symmetric Skin: Skin color, texture, turgor normal. No rashes or lesions Neurologic: Grossly normal Psych: Pleasant, mildly anxious  EKG: EKG Interpretation Date/Time:  Thursday September 17 2023 11:06:23 EST Ventricular Rate:  80 PR Interval:  180 QRS Duration:  88 QT Interval:  382 QTC Calculation: 440 R Axis:   25  Text Interpretation: Normal sinus rhythm Normal ECG When compared with ECG of 25-Feb-2023 08:04, Premature ventricular complexes are no longer Present Confirmed by Zoila Shutter [phone]) on 09/17/2023 11:13:45 AM    ASSESSMENT: Recurrent DVT/PE on Warfarin Spinal stenosis Low risk for spinal injections Obesity status post bariatric surgery Hypertension DM2  PLAN: 1.   Ana Baker seems to be doing well on warfarin for anticoagulation.  This is due to cost issues.  She had a recent fall but fortunately no fracture.  Blood pressure is low normal today but improved after stopping HCTZ recently when she was symptomatic.  Otherwise she denies any chest pain or shortness of breath.  No changes to her meds today.  Plan follow-up with [place] annually or sooner as necessary.  Chrystie Nose, MD, [facility], FACP  [place]  [facility]  Medical Director of the Advanced Lipid Disorders &  Cardiovascular Risk Reduction Clinic Diplomate of the American Board of Clinical Lipidology Attending Cardiologist  Direct Dial: [phone]  Fax: [phone]  Website:  [email or web address] Nicely Salah Nakamura 09/17/2023, 11:14 AM

## 2023-09-21 ENCOUNTER — Other Ambulatory Visit: Payer: Self-pay | Admitting: Internal Medicine

## 2023-09-21 DIAGNOSIS — Z86711 Personal history of pulmonary embolism: Secondary | ICD-10-CM

## 2023-10-02 ENCOUNTER — Ambulatory Visit: Payer: Medicare HMO | Attending: Internal Medicine

## 2023-10-02 DIAGNOSIS — I82409 Acute embolism and thrombosis of unspecified deep veins of unspecified lower extremity: Secondary | ICD-10-CM

## 2023-10-02 DIAGNOSIS — Z7901 Long term (current) use of anticoagulants: Secondary | ICD-10-CM | POA: Diagnosis not present

## 2023-10-02 DIAGNOSIS — Z86711 Personal history of pulmonary embolism: Secondary | ICD-10-CM | POA: Diagnosis not present

## 2023-10-02 LAB — POCT INR: INR: 3 (ref 2.0–3.0)

## 2023-10-02 NOTE — Patient Instructions (Signed)
 continue taking 10mg  daily except 5mg  on Mondays and Wednesdays.  Stay consistent with greens each week (1 per week).  Recheck INR in 6 weeks. Coumadin Clinic [phone]

## 2023-11-13 ENCOUNTER — Ambulatory Visit: Payer: Medicare HMO | Attending: Cardiology

## 2023-11-13 DIAGNOSIS — Z7901 Long term (current) use of anticoagulants: Secondary | ICD-10-CM

## 2023-11-13 DIAGNOSIS — Z86711 Personal history of pulmonary embolism: Secondary | ICD-10-CM | POA: Diagnosis not present

## 2023-11-13 LAB — POCT INR: INR: 2.3 (ref 2.0–3.0)

## 2023-11-13 NOTE — Patient Instructions (Signed)
 continue taking 10mg  daily except 5mg  on Mondays and Wednesdays.  Stay consistent with greens each week (1 per week).  Recheck INR in 6 weeks. Coumadin Clinic [phone]

## 2023-11-16 DIAGNOSIS — E1142 Type 2 diabetes mellitus with diabetic polyneuropathy: Secondary | ICD-10-CM | POA: Insufficient documentation

## 2023-11-25 ENCOUNTER — Other Ambulatory Visit: Payer: Self-pay | Admitting: Internal Medicine

## 2023-11-25 DIAGNOSIS — Z86711 Personal history of pulmonary embolism: Secondary | ICD-10-CM

## 2023-11-25 NOTE — Telephone Encounter (Signed)
 Warfarin 5mg  refill DVT/PE Last INR 11/13/23 Last OV on 09/17/23

## 2023-12-07 ENCOUNTER — Other Ambulatory Visit (HOSPITAL_BASED_OUTPATIENT_CLINIC_OR_DEPARTMENT_OTHER): Payer: Self-pay | Admitting: Family

## 2023-12-25 ENCOUNTER — Ambulatory Visit: Attending: Internal Medicine

## 2023-12-25 DIAGNOSIS — Z86711 Personal history of pulmonary embolism: Secondary | ICD-10-CM

## 2023-12-25 DIAGNOSIS — Z7901 Long term (current) use of anticoagulants: Secondary | ICD-10-CM | POA: Diagnosis not present

## 2023-12-25 LAB — POCT INR: INR: 2.6 (ref 2.0–3.0)

## 2023-12-25 NOTE — Patient Instructions (Signed)
 continue taking 10mg  daily except 5mg  on Mondays and Wednesdays.  Stay consistent with greens each week (1 per week).  Recheck INR in 6 weeks. Coumadin Clinic [phone]

## 2024-02-05 ENCOUNTER — Ambulatory Visit: Attending: Internal Medicine

## 2024-02-05 DIAGNOSIS — Z7901 Long term (current) use of anticoagulants: Secondary | ICD-10-CM | POA: Diagnosis not present

## 2024-02-05 DIAGNOSIS — Z86711 Personal history of pulmonary embolism: Secondary | ICD-10-CM | POA: Diagnosis not present

## 2024-02-05 LAB — POCT INR: INR: 2.6 (ref 2.0–3.0)

## 2024-02-05 NOTE — Patient Instructions (Signed)
 continue taking 10mg  daily except 5mg  on Mondays and Wednesdays.  Stay consistent with greens each week (1 per week).  Recheck INR in 6 weeks. Coumadin Clinic [phone]

## 2024-02-07 ENCOUNTER — Other Ambulatory Visit: Payer: Self-pay | Admitting: Internal Medicine

## 2024-02-07 DIAGNOSIS — Z86711 Personal history of pulmonary embolism: Secondary | ICD-10-CM

## 2024-02-19 ENCOUNTER — Other Ambulatory Visit (HOSPITAL_BASED_OUTPATIENT_CLINIC_OR_DEPARTMENT_OTHER): Payer: Self-pay | Admitting: Internal Medicine

## 2024-03-18 ENCOUNTER — Ambulatory Visit: Attending: Internal Medicine

## 2024-03-18 DIAGNOSIS — Z86711 Personal history of pulmonary embolism: Secondary | ICD-10-CM | POA: Diagnosis not present

## 2024-03-18 DIAGNOSIS — Z7901 Long term (current) use of anticoagulants: Secondary | ICD-10-CM | POA: Diagnosis not present

## 2024-03-18 LAB — POCT INR: INR: 2.6 (ref 2.0–3.0)

## 2024-03-18 NOTE — Progress Notes (Signed)
 INR 2.6; Please see anticoagulation encounter

## 2024-03-18 NOTE — Patient Instructions (Signed)
 continue taking 10mg  daily except 5mg  on Mondays and Wednesdays.  Stay consistent with greens each week (1 per week).  Recheck INR in 6 weeks. Coumadin Clinic [phone]

## 2024-04-29 ENCOUNTER — Ambulatory Visit: Attending: Internal Medicine | Admitting: Pharmacist

## 2024-04-29 DIAGNOSIS — Z86711 Personal history of pulmonary embolism: Secondary | ICD-10-CM

## 2024-04-29 DIAGNOSIS — I824Y9 Acute embolism and thrombosis of unspecified deep veins of unspecified proximal lower extremity: Secondary | ICD-10-CM | POA: Diagnosis not present

## 2024-04-29 DIAGNOSIS — Z7901 Long term (current) use of anticoagulants: Secondary | ICD-10-CM | POA: Diagnosis not present

## 2024-04-29 LAB — POCT INR: INR: 2.2 (ref 2.0–3.0)

## 2024-04-29 NOTE — Patient Instructions (Signed)
 Description    continue taking 10mg  daily except 5mg  on Mondays and Wednesdays.  Stay consistent with greens each week (1 per week).  Recheck INR in 6 weeks. Coumadin  Clinic [phone]

## 2024-04-29 NOTE — Progress Notes (Signed)
 INR 2.2 Please see anticoagulation encounter  Description   Continue taking 10mg  daily except 5mg  on Mondays and Wednesdays.  Stay consistent with greens each week (1 per week).  Recheck INR in 6 weeks. Coumadin  Clinic [phone]

## 2024-05-27 ENCOUNTER — Other Ambulatory Visit (HOSPITAL_BASED_OUTPATIENT_CLINIC_OR_DEPARTMENT_OTHER): Payer: Self-pay | Admitting: Family

## 2024-06-10 ENCOUNTER — Ambulatory Visit: Attending: Internal Medicine | Admitting: Pharmacist

## 2024-06-10 DIAGNOSIS — Z86711 Personal history of pulmonary embolism: Secondary | ICD-10-CM

## 2024-06-10 DIAGNOSIS — Z7901 Long term (current) use of anticoagulants: Secondary | ICD-10-CM | POA: Diagnosis not present

## 2024-06-10 DIAGNOSIS — I824Y9 Acute embolism and thrombosis of unspecified deep veins of unspecified proximal lower extremity: Secondary | ICD-10-CM

## 2024-06-10 LAB — POCT INR: INR: 1.7 — AB (ref 2.0–3.0)

## 2024-06-10 NOTE — Patient Instructions (Signed)
 Description   INR 1.7: Patient has started Levaquin and had 2 doses. Will not boost today but will recheck in 1 week. \ Continue taking 10mg  daily except 5mg  on Mondays and Wednesdays.  Stay consistent with greens each week (1 per week).  Recheck INR in 1 weeks. Coumadin  Clinic [phone]

## 2024-06-10 NOTE — Progress Notes (Signed)
 Description   INR 1.7: Patient has started Levaquin and had 2 doses. Will not boost today but will recheck in 1 week. \ Continue taking 10mg  daily except 5mg  on Mondays and Wednesdays.  Stay consistent with greens each week (1 per week).  Recheck INR in 1 weeks. Coumadin  Clinic [phone]

## 2024-06-17 ENCOUNTER — Ambulatory Visit: Attending: Cardiovascular Disease | Admitting: Pharmacist

## 2024-06-17 DIAGNOSIS — Z7901 Long term (current) use of anticoagulants: Secondary | ICD-10-CM | POA: Diagnosis not present

## 2024-06-17 DIAGNOSIS — Z86711 Personal history of pulmonary embolism: Secondary | ICD-10-CM

## 2024-06-17 DIAGNOSIS — I824Y9 Acute embolism and thrombosis of unspecified deep veins of unspecified proximal lower extremity: Secondary | ICD-10-CM | POA: Diagnosis not present

## 2024-06-17 LAB — POCT INR: INR: 2.1 (ref 2.0–3.0)

## 2024-06-17 NOTE — Progress Notes (Signed)
 Description   INR 2.1 :  Continue taking 10mg  daily except 5mg  on Mondays and Wednesdays.  Stay consistent with greens each week (1 per week).  Recheck INR in 2 weeks. Coumadin  Clinic [phone]

## 2024-06-17 NOTE — Patient Instructions (Signed)
 Description   INR 2.1 :  Continue taking 10mg  daily except 5mg  on Mondays and Wednesdays.  Stay consistent with greens each week (1 per week).  Recheck INR in 2 weeks. Coumadin  Clinic [phone]

## 2024-06-22 ENCOUNTER — Telehealth (HOSPITAL_BASED_OUTPATIENT_CLINIC_OR_DEPARTMENT_OTHER): Payer: Self-pay | Admitting: *Deleted

## 2024-06-22 ENCOUNTER — Telehealth (HOSPITAL_BASED_OUTPATIENT_CLINIC_OR_DEPARTMENT_OTHER): Payer: Self-pay

## 2024-06-22 DIAGNOSIS — Z01818 Encounter for other preprocedural examination: Secondary | ICD-10-CM

## 2024-06-22 NOTE — Telephone Encounter (Signed)
 Pt has been scheduled tele preop appt 07/06/24. Med rec and consent are done. Pt has appt 07/01/24 with the anticoagulation clinic.

## 2024-06-22 NOTE — Telephone Encounter (Signed)
 Patient will need VV once pharmacy has weighed in on coumadin  hold.

## 2024-06-22 NOTE — Telephone Encounter (Signed)
 Pt has been scheduled tele preop appt 07/06/24. Med rec and consent are done. Pt has appt 07/01/24 with the anticoagulation clinic.      Patient Consent for Virtual Visit        Ana Baker has provided verbal consent on 06/22/2024 for a virtual visit (video or telephone).   CONSENT FOR VIRTUAL VISIT FOR:  Ana Baker  By participating in this virtual visit I agree to the following:  I hereby voluntarily request, consent and authorize [facility] and its employed or contracted physicians, physician assistants, nurse practitioners or other licensed health care professionals (the Practitioner), to provide me with telemedicine health care services (the "Services) as deemed necessary by the treating Practitioner. I acknowledge and consent to receive the Services by the Practitioner via telemedicine. I understand that the telemedicine visit will involve communicating with the Practitioner through live audiovisual communication technology and the disclosure of certain medical information by electronic transmission. I acknowledge that I have been given the opportunity to request an in-person assessment or other available alternative prior to the telemedicine visit and am voluntarily participating in the telemedicine visit.  I understand that I have the right to withhold or withdraw my consent to the use of telemedicine in the course of my care at any time, without affecting my right to future care or treatment, and that the Practitioner or I may terminate the telemedicine visit at any time. I understand that I have the right to inspect all information obtained and/or recorded in the course of the telemedicine visit and may receive copies of available information for a reasonable fee.  I understand that some of the potential risks of receiving the Services via telemedicine include:  Delay or interruption in medical evaluation due to technological equipment failure or  disruption; Information transmitted may not be sufficient (e.g. poor resolution of images) to allow for appropriate medical decision making by the Practitioner; and/or  In rare instances, security protocols could fail, causing a breach of personal health information.  Furthermore, I acknowledge that it is my responsibility to provide information about my medical history, conditions and care that is complete and accurate to the best of my ability. I acknowledge that Practitioner's advice, recommendations, and/or decision may be based on factors not within their control, such as incomplete or inaccurate data provided by me or distortions of diagnostic images or specimens that may result from electronic transmissions. I understand that the practice of medicine is not an exact science and that Practitioner makes no warranties or guarantees regarding treatment outcomes. I acknowledge that a copy of this consent can be made available to me via my patient portal [facility] MyChart), or I can request a printed copy by calling the office of [facility].    I understand that my insurance will be billed for this visit.   I have read or had this consent read to me. I understand the contents of this consent, which adequately explains the benefits and risks of the Services being provided via telemedicine.  I have been provided ample opportunity to ask questions regarding this consent and the Services and have had my questions answered to my satisfaction. I give my informed consent for the services to be provided through the use of telemedicine in my medical care

## 2024-06-22 NOTE — Telephone Encounter (Signed)
   Pre-operative Risk Assessment    Patient Name: RHETT NAJERA  DOB: [birth year] MRN: [id number]   Date of last office visit: 09/17/23 with Dr. Mona Date of next office visit: NA   Request for Surgical Clearance    Procedure:  Colonoscopy and EGD  Date of Surgery:  Clearance TBD                                 Surgeon:  Dr. Marvis Socks Group or Practice Name:  [facility] Phone number:  [phone]  Fax number:  [phone]   Type of Clearance Requested:   - Medical  - Pharmacy:  Hold Warfarin (Coumadin ) for 5 days prior   Type of Anesthesia:  Not Indicated   Additional requests/questions:    Bonney Augustin JONETTA Delores   06/22/2024, 10:00 AM

## 2024-06-22 NOTE — Telephone Encounter (Signed)
 Pharmacy please advise on holding coumadin  for 5 days prior to colonoscopy and EDG  scheduled for TBD. Thank you.

## 2024-07-01 ENCOUNTER — Ambulatory Visit: Attending: Cardiology | Admitting: *Deleted

## 2024-07-01 DIAGNOSIS — I824Y9 Acute embolism and thrombosis of unspecified deep veins of unspecified proximal lower extremity: Secondary | ICD-10-CM

## 2024-07-01 DIAGNOSIS — Z86711 Personal history of pulmonary embolism: Secondary | ICD-10-CM | POA: Diagnosis not present

## 2024-07-01 DIAGNOSIS — Z7901 Long term (current) use of anticoagulants: Secondary | ICD-10-CM

## 2024-07-01 LAB — POCT INR: INR: 1.7 — AB (ref 2.0–3.0)

## 2024-07-01 NOTE — Progress Notes (Signed)
 Description   INR-1.7; Today take 12.5mg  of warfarin then continue taking 10mg  daily except 5mg  on Mondays and Wednesdays.  Stay consistent with greens each week (1 per week).  Recheck INR in 2 weeks. Coumadin  Clinic [phone]

## 2024-07-01 NOTE — Telephone Encounter (Signed)
 Patient with diagnosis of recurrent DVT on warfarin for anticoagulation.    Procedure:  Colonoscopy and EGD  Date of procedure: TBD  Patient will need BMP and CBC prior to bridge  Per office protocol, patient can hold warfarin for 5 days prior to procedure.    Patient WILL need bridging with Lovenox (enoxaparin) around procedure.  **This guidance is not considered finalized until pre-operative APP has relayed final recommendations.**

## 2024-07-01 NOTE — Patient Instructions (Signed)
 Description   INR-1.7; Today take 12.5mg  of warfarin then continue taking 10mg  daily except 5mg  on Mondays and Wednesdays.  Stay consistent with greens each week (1 per week).  Recheck INR in 2 weeks. Coumadin  Clinic [phone]

## 2024-07-01 NOTE — Telephone Encounter (Signed)
 Please order CBC and BMET for patient on 07/06/2024 as pharmacy will require bridging.

## 2024-07-04 ENCOUNTER — Encounter: Payer: Self-pay | Admitting: Pharmacist

## 2024-07-04 LAB — CBC
Hematocrit: 41.4 % (ref 34.0–46.6)
Hemoglobin: 13.8 g/dL (ref 11.1–15.9)
MCH: 31.9 pg (ref 26.6–33.0)
MCHC: 33.3 g/dL (ref 31.5–35.7)
MCV: 96 fL (ref 79–97)
Platelets: 249 x10E3/uL (ref 150–450)
RBC: 4.33 x10E6/uL (ref 3.77–5.28)
RDW: 13.1 % (ref 11.7–15.4)
WBC: 4 x10E3/uL (ref 3.4–10.8)

## 2024-07-04 LAB — BASIC METABOLIC PANEL WITH GFR
BUN/Creatinine Ratio: 15 (ref 9–23)
BUN: 14 mg/dL (ref 6–24)
CO2: 23 mmol/L (ref 20–29)
Calcium: 9.3 mg/dL (ref 8.7–10.2)
Chloride: 102 mmol/L (ref 96–106)
Creatinine, Ser: 0.96 mg/dL (ref 0.57–1.00)
Glucose: 123 mg/dL — ABNORMAL HIGH (ref 70–99)
Potassium: 4.5 mmol/L (ref 3.5–5.2)
Sodium: 141 mmol/L (ref 134–144)
eGFR: 69 mL/min/1.73 (ref >59)

## 2024-07-04 NOTE — Addendum Note (Signed)
 Addended by: WYNETTA NIELS HERO on: 07/04/2024 08:27 AM   Modules accepted: Orders

## 2024-07-04 NOTE — Telephone Encounter (Signed)
 Left message for the pt that she is going to need labs. I will place lab orders. Pt can go to any [facility] closest to her. May need to move her tele to allo time for results and pharm-d recommendations.   Left message to call back to preop team.

## 2024-07-04 NOTE — Telephone Encounter (Signed)
 Labs were found in care everywhere. Patient does not need to go for labs Plt 321 (6/25) Scr 0.7 (05/11/24)  CrCl:>100 ml/min

## 2024-07-04 NOTE — Telephone Encounter (Signed)
 Spoke with patient to update her that we located her most recent labs and she stated after receiving the voicemail she went and had the labs drawn today.   Also, she states she does not have a date for the procedure at this time and is aware to update the Anticoagulation once she does.

## 2024-07-05 ENCOUNTER — Ambulatory Visit: Payer: Self-pay | Admitting: Pharmacist

## 2024-07-05 NOTE — Telephone Encounter (Signed)
 Pt has tele preop appt 07/06/24.

## 2024-07-06 ENCOUNTER — Ambulatory Visit: Attending: Student in an Organized Health Care Education/Training Program | Admitting: Student

## 2024-07-06 DIAGNOSIS — Z01810 Encounter for preprocedural cardiovascular examination: Secondary | ICD-10-CM | POA: Diagnosis not present

## 2024-07-06 NOTE — Progress Notes (Signed)
 Virtual Visit via Telephone Note   Because of Ana Baker's co-morbid illnesses, she is at least at moderate risk for complications without adequate follow up.  This format is felt to be most appropriate for this patient at this time.  The patient did not have access to video technology/had technical difficulties with video requiring transitioning to audio format only (telephone).  All issues noted in this document were discussed and addressed.  No physical exam could be performed with this format.  Please refer to the patient's chart for her consent to telehealth for [facility].  Evaluation Performed:  Preoperative cardiovascular risk assessment _____________   Date:  07/06/2024   Patient ID:  Ana Baker, DOB [birth year], MRN [id number] Patient Location:  Home Provider location:   Office  Primary Care Provider:  Joeline Georgeanne DELENA, MD  Primary Cardiologist:  [facility] Providers Cardiologist:  Vinie JAYSON Maxcy, MD    Chief Complaint / Patient Profile   58 y.o. y/o female with a h/o recurrent DVT/PE on anticoagulation, hypertension, T2DM, obesity s/p gastric bypass who is pending colonoscopy and EGD by Dr. Marvis and presents today for telephonic preoperative cardiovascular risk assessment.  History of Present Illness    Ana Baker is a 58 y.o. female who presents via audio/video conferencing for a telehealth visit today.  Pt was last seen in cardiology clinic on 09/17/2023 by Dr. Maxcy.  At that time Ana Baker was stable from a cardiac standpoint.  The patient is now pending procedure as outlined above. Since her last visit, she is doing well. Patient denies shortness of breath, dyspnea on exertion, orthopnea or PND. She reports stable, chronic lower extremity edema managed with elevation throughout the day.  No chest pain, pressure, or tightness. No palpitations.   She is not experiencing lightheadedness, dizziness, presyncope or syncope. [facility]. She has been out of her routine for a couple of months b ut plans to return to that activity. She is able to perform light to moderate household activities without limitation.   Past Medical History    Past Medical History:  Diagnosis Date   Anemia    Anxiety    DDD (degenerative disc disease), lumbar    DDD (degenerative disc disease), lumbar    Depression    Diabetes mellitus without complication (HCC)    DVT (deep venous thrombosis) (HCC) 04/2010   and PE, in post-operative setting    Endometriosis    GERD (gastroesophageal reflux disease)    History of kidney stones    History of pulmonary embolism    Hypertension    Idiopathic parathyroidism    Migraines    OA (osteoarthritis)    Obesity    Osteopenia    Osteoporosis    Pneumonia    x2   PONV (postoperative nausea and vomiting)    Spinal stenosis    Venous reflux    left leg   Vitamin D deficiency    Past Surgical History:  Procedure Laterality Date   ABDOMINAL HYSTERECTOMY  2008   BOWEL RESECTION  03/22/04   sbo   CHOLECYSTECTOMY OPEN  1990   COLONOSCOPY W/ POLYPECTOMY     GASTRIC BYPASS  07/31/2003   DVT post-op - IV filter   HERNIA REPAIR  08/2004   Internal hernia repair   JOINT REPLACEMENT     KIDNEY STONE SURGERY  98,00   KNEE ARTHROSCOPY Left 06,07   KNEE  ARTHROSCOPY Left 12/2009   Dr. Gerome   LIPOMA EXCISION  03/12/04   right arm   NM MYOCAR PERF WALL MOTION  04/2010   dipyridamole; normal pattern of perfusion to all regions; EF 68%; low risk study    SALPINGOOPHORECTOMY Left    SPINE SURGERY  2009   TOTAL KNEE ARTHROPLASTY Right 82,98,99,00   DVT & PE post-op 1999   TOTAL KNEE ARTHROPLASTY Left 12/09/2019   Procedure: TOTAL KNEE ARTHROPLASTY;  Surgeon: Gerome Charleston, MD;  Location: [facility];  Service: Orthopedics;  Laterality: Left;  adductor canal   TRANSTHORACIC ECHOCARDIOGRAM  03/2012   EF=>55%, trace MR & TR, normal RSVP    Allergies  Allergies  Allergen  Reactions   Almond Oil Anaphylaxis and Rash    ALL ALMONDS!   Nsaids Nausea Only   Adhesive [Tape] Hives and Rash    Burns skin   Penicillins Rash    Did it involve swelling of the face/tongue/throat, SOB, or low BP? No Did it involve sudden or severe rash/hives, skin peeling, or any reaction on the inside of your mouth or nose? Yes Did you need to seek medical attention at a hospital or doctor's office? Was in the hospital when it happend When did it last happen?      21 years ago If all above answers are "NO", may proceed with cephalosporin use.    Tolmetin Nausea Only   Vancomycin Rash    Home Medications    Prior to Admission medications   Medication Sig Start Date End Date Taking? Authorizing Provider  Atogepant (QULIPTA) 60 MG TABS Take 60 tablets by mouth daily. 06/01/23   [provider]  atorvastatin  (LIPITOR) 40 MG tablet TAKE 1 TABLET EVERY DAY 02/19/24   Hilty, Vinie BROCKS, MD  calcium  carbonate (OSCAL) 1500 (600 Ca) MG TABS tablet Take by mouth 2 (two) times daily with a meal.    [provider]  cholecalciferol (VITAMIN D3) 25 MCG (1000 UNIT) tablet Take 1,000 Units by mouth daily.    [provider]  famotidine  (PEPCID ) 40 MG tablet Take 1 tablet (40 mg total) by mouth at bedtime. 05/27/24   Hilty, Vinie BROCKS, MD  FLUoxetine  (PROZAC ) 40 MG capsule Take 40 mg daily by mouth.    [provider]  gabapentin  (NEURONTIN ) 300 MG capsule Take 300 mg by mouth 3 (three) times daily. Take one tablet 300mg  by mouth in the morning, and in the afternoon, take 2 tablets 600mg  at bedtime.    [provider]  HYDROcodone -acetaminophen  (NORCO) 7.5-325 MG tablet Take 1 tablet by mouth 3 (three) times daily as needed. 11/13/21   [provider]  influenza vac split quadrivalent PF (FLUARIX) 0.5 ML injection Inject into the muscle. 06/27/22   Luiz Channel, MD  l-methylfolate-B6-B12 (METANX) 3-35-2 MG TABS tablet Take 1 tablet by mouth daily.     [provider]  lubiprostone  (AMITIZA ) 24 MCG capsule Take 1 capsule (24 mcg total) by mouth 2 times daily with meals. 03/14/22     Methylcobalamin 1 MG CHEW Chew by mouth.    [provider]  metoprolol  succinate (TOPROL -XL) 25 MG 24 hr tablet TAKE 1 TABLET EVERY DAY 12/07/23   Walker, Caitlin S, NP  morphine  (MS CONTIN ) 15 MG 12 hr tablet Take 15 mg by mouth 3 (three) times daily.  09/14/13   [provider]  nitroGLYCERIN  (NITROSTAT ) 0.4 MG SL tablet Place 1 tablet (0.4 mg total) under the tongue every 5 (five) minutes as  needed for chest pain. Patient taking differently: Place 0.4 mg under the tongue every 5 (five) minutes as needed for chest pain. PRN AS NEEDED 06/27/22 06/22/24  Walker, Caitlin S, NP  pantoprazole  (PROTONIX ) 40 MG tablet TAKE 1 TABLET TWICE DAILY 06/17/23   Walker, Caitlin S, NP  Pediatric Multivitamins-Iron (CHILDRENS MULTIVITAMIN/IRON PO) Take 1 tablet by mouth daily. Flinstones Patient not taking: Reported on 06/22/2024    [provider]  Pyridoxal-5 Phosphate 100 MG/ML SOLN Inject as directed. Patient not taking: Reported on 06/22/2024    [provider]  Semaglutide,0.25 or 0.5MG /DOS, (OZEMPIC, 0.25 OR 0.5 MG/DOSE,) 2 MG/3ML SOPN Inject 0.5 mg as directed once a week.    [provider]  SUMAtriptan  (IMITREX ) 100 MG tablet Take 1 tablet (100 mg total) by mouth every 2 (two) hours as needed for migraine. 10/27/13   Zanard, Robyn K, MD  warfarin (COUMADIN ) 5 MG tablet TAKE 1 TABLET TO 2 TABLETS BY MOUTH DAILY AS DIRECTED BY COUMADIN  CLINIC 02/08/24   Hilty, Vinie BROCKS, MD    Physical Exam    Vital Signs:  Ana Baker does not have vital signs available for review today.  Given telephonic nature of communication, physical exam is limited. AAOx3. NAD. Normal affect.  Speech and respirations are unlabored.  Accessory Clinical Findings    None   Assessment & Plan    Preoperative cardiovascular risk assessment.   Colonoscopy and EGD by Dr. Marvis   Chart reviewed as part of pre-operative protocol coverage. According to the RCRI, patient has a 0.4% risk of MACE. Patient reports activity equivalent to >4.0 METS (independent with ADLs and able to perform light to moderate household activities).   Given past medical history and time since last visit, based on ACC/AHA guidelines, Ana Baker would be at acceptable risk for the planned procedure without further cardiovascular testing.   Patient was advised that if she develops new symptoms prior to surgery to contact our office to arrange a follow-up appointment.  she verbalized understanding.  Per Pharm D, patient may hold Coumadin  for 5 days prior to procedure.  Patient will need bridging with Lovenox around procedure. Patient is instructed to contact the Coumadin  clinic when she receives a date for the procedure so Lovenox bridge can be set up. She verbalized understanding.   I will route this recommendation to the requesting party via Epic fax function.  Please call with questions.  Time:   Today, I have spent 5 minutes with the patient with telehealth technology discussing medical history, symptoms, and management plan.     Barnie Hila, NP  07/06/2024, 7:57 AM

## 2024-07-12 ENCOUNTER — Encounter: Payer: Self-pay | Admitting: Pharmacist

## 2024-07-14 ENCOUNTER — Other Ambulatory Visit (HOSPITAL_BASED_OUTPATIENT_CLINIC_OR_DEPARTMENT_OTHER): Payer: Self-pay | Admitting: Family

## 2024-07-15 ENCOUNTER — Ambulatory Visit: Attending: Internal Medicine | Admitting: Pharmacist

## 2024-07-15 DIAGNOSIS — I824Y9 Acute embolism and thrombosis of unspecified deep veins of unspecified proximal lower extremity: Secondary | ICD-10-CM

## 2024-07-15 DIAGNOSIS — Z7901 Long term (current) use of anticoagulants: Secondary | ICD-10-CM

## 2024-07-15 DIAGNOSIS — Z86711 Personal history of pulmonary embolism: Secondary | ICD-10-CM | POA: Diagnosis not present

## 2024-07-15 LAB — POCT INR: INR: 1.9 — AB (ref 2.0–3.0)

## 2024-07-15 NOTE — Progress Notes (Signed)
 Description   INR-1.9; Continue taking 10mg  daily except 5mg  on Mondays and Wednesdays.  Stay consistent with greens each week (1 per week).  Recheck INR in 2 weeks. Coumadin  Clinic [phone]    Did not give boost today due to increasing Depakote

## 2024-07-15 NOTE — Patient Instructions (Signed)
 Description   INR-1.9; Continue taking 10mg  daily except 5mg  on Mondays and Wednesdays.  Stay consistent with greens each week (1 per week).  Recheck INR in 2 weeks. Coumadin  Clinic [phone]

## 2024-07-29 ENCOUNTER — Ambulatory Visit

## 2024-08-01 ENCOUNTER — Ambulatory Visit

## 2024-08-15 ENCOUNTER — Ambulatory Visit: Attending: Cardiology | Admitting: *Deleted

## 2024-08-15 DIAGNOSIS — Z86711 Personal history of pulmonary embolism: Secondary | ICD-10-CM

## 2024-08-15 DIAGNOSIS — Z7901 Long term (current) use of anticoagulants: Secondary | ICD-10-CM

## 2024-08-15 DIAGNOSIS — I824Y9 Acute embolism and thrombosis of unspecified deep veins of unspecified proximal lower extremity: Secondary | ICD-10-CM | POA: Diagnosis not present

## 2024-08-15 LAB — POCT INR: INR: 5.3 — AB (ref 2.0–3.0)

## 2024-08-15 NOTE — Progress Notes (Signed)
 Description   INR-5.3; Do not take any warfarin today and no warfarin tomorrow then continue taking 10mg  daily except 5mg  on Mondays and Wednesdays.  Stay consistent with greens each week (1 per week).  Recheck INR in 1 week. Coumadin  Clinic [phone]

## 2024-08-15 NOTE — Patient Instructions (Signed)
 Description   INR-5.3; Do not take any warfarin today and no warfarin tomorrow then continue taking 10mg  daily except 5mg  on Mondays and Wednesdays.  Stay consistent with greens each week (1 per week).  Recheck INR in 1 week. Coumadin  Clinic [phone]

## 2024-08-23 ENCOUNTER — Ambulatory Visit: Attending: Cardiology | Admitting: Pharmacist

## 2024-08-23 DIAGNOSIS — Z86711 Personal history of pulmonary embolism: Secondary | ICD-10-CM | POA: Diagnosis not present

## 2024-08-23 DIAGNOSIS — Z7901 Long term (current) use of anticoagulants: Secondary | ICD-10-CM | POA: Diagnosis not present

## 2024-08-23 DIAGNOSIS — I824Y9 Acute embolism and thrombosis of unspecified deep veins of unspecified proximal lower extremity: Secondary | ICD-10-CM

## 2024-08-23 LAB — POCT INR: INR: 3.4 — AB (ref 2.0–3.0)

## 2024-08-23 NOTE — Patient Instructions (Addendum)
 Description   INR-3.4; Hold dose today and reduce Fridays to 1 tablet. One tablet on Monday, Wednesday, and Friday. 2 tablets all other days of the week  Stay consistent with greens each week (1 per week).  Recheck INR in 1 week. Coumadin  Clinic [phone]

## 2024-08-23 NOTE — Progress Notes (Signed)
 Description   INR-3.4; Hold dose today and reduce Fridays to 1 tablet. One tablet on Monday, Wednesday, and Friday. 2 tablets all other days of the week  Stay consistent with greens each week (1 per week).  Recheck INR in 1 week. Coumadin  Clinic [phone]

## 2024-08-31 ENCOUNTER — Encounter: Payer: Self-pay | Admitting: Internal Medicine

## 2024-09-06 ENCOUNTER — Ambulatory Visit: Admitting: Pharmacist

## 2024-09-06 DIAGNOSIS — Z86711 Personal history of pulmonary embolism: Secondary | ICD-10-CM

## 2024-09-06 DIAGNOSIS — I824Y9 Acute embolism and thrombosis of unspecified deep veins of unspecified proximal lower extremity: Secondary | ICD-10-CM

## 2024-09-06 DIAGNOSIS — Z7901 Long term (current) use of anticoagulants: Secondary | ICD-10-CM

## 2024-09-06 LAB — POCT INR: INR: 3.5 — AB (ref 2.0–3.0)

## 2024-09-06 NOTE — Progress Notes (Signed)
 Description   INR-3.5; Hold dose today and reduce Saturdays to 1 tablet. New schedule: 1 tablet on Monday, Wednesday, Friday, Saturday, 2 tablets all other days of the week   Stay consistent with greens each week (1 per week).  Recheck INR in 2 week. Coumadin  Clinic [phone]

## 2024-09-06 NOTE — Patient Instructions (Signed)
 Description   INR-3.5; Hold dose today and reduce Saturdays to 1 tablet. New schedule: 1 tablet on Monday, Wednesday, Friday, Saturday, 2 tablets all other days of the week   Stay consistent with greens each week (1 per week).  Recheck INR in 2 week. Coumadin  Clinic [phone]

## 2024-09-07 ENCOUNTER — Encounter: Payer: Self-pay | Admitting: Pharmacist

## 2024-09-09 ENCOUNTER — Telehealth (HOSPITAL_BASED_OUTPATIENT_CLINIC_OR_DEPARTMENT_OTHER): Payer: Self-pay | Admitting: *Deleted

## 2024-09-09 NOTE — Telephone Encounter (Signed)
-----   Message from Lonni Bolk sent at 09/09/2024  8:48 AM EST ----- Regarding: RE: PREOP REQUEST? No she did not have a form.  She told me about in an INR appt but didn't have a date. She messaged me yesterday it was scheduled ----- Message ----- From: Wynetta Niels HERO, CMA Sent: 09/09/2024   8:46 AM EST To: Callie E Goodrich, PA-C; Lonni Bolk CLOSS Subject: PREOP REQUEST?                                 Good morning [place],   Did the pt come in with a preop request in hand? If so can you fax it into on base [phone] and we can grab the form from there.   Thank you Niels ----- Message ----- From: Goodrich, Callie E, PA-C Sent: 09/09/2024   8:33 AM EST To: Cv Div Preop Callback  Hey pre-op op covering team, can we please get a pre-op clearance form scanned into the chart.  Thank you! Callie ----- Message ----- From: Pavero, Christopher, [facility] Sent: 09/08/2024   9:40 AM EST To: Cv Div Preop  Patient will be having sinus procedure on 2/27. On warfarin and will likely need Lovenox.  Please request clearance  [facility] - Otolaryngology [facility]  [address]  [address]  [place], [place] [address]  [phone]

## 2024-09-09 NOTE — Telephone Encounter (Signed)
 Our preop team received a staff message from pharm-d [place] Bolk, [facility] in regard to preop clearance. I then called the surgeon office and s/w staff member. I stated that we will need a preop clearance request to be faxed over to us  [phone] attn:,preop team. Once we have the form we then can begin the process with through the preop team for clearance.

## 2024-09-12 NOTE — Telephone Encounter (Signed)
 I just called office the pt gave us  who will be doing her procedure to obtain a preop clearance request and their office is closed for the holiday.

## 2024-09-13 ENCOUNTER — Encounter: Payer: Self-pay | Admitting: Internal Medicine

## 2024-09-13 NOTE — Telephone Encounter (Signed)
 I have called the requesting office today again to obtain the preop clearance that is needed before pt can be cleared by cardiology.    I was sent to a vm for Ignacio. I left a detailed message that we will need preop clearance request faxed to our office today before pt can be cleared. Fax# [phone] preop team  I will fax this note as well to Sari with [facility] fax# [phone] per website.

## 2024-09-13 NOTE — Telephone Encounter (Signed)
 We received notes today from [facility]; though the notes were demographics only.   I will fax back to requesting office with hand written information needed.

## 2024-09-15 ENCOUNTER — Telehealth (HOSPITAL_BASED_OUTPATIENT_CLINIC_OR_DEPARTMENT_OTHER): Payer: Self-pay | Admitting: *Deleted

## 2024-09-15 ENCOUNTER — Other Ambulatory Visit: Payer: Self-pay | Admitting: Internal Medicine

## 2024-09-15 DIAGNOSIS — Z86711 Personal history of pulmonary embolism: Secondary | ICD-10-CM

## 2024-09-15 NOTE — Telephone Encounter (Signed)
 Pt has been scheduled tele preop appt 09/29/24. Med rec and consent are done.      Patient Consent for Virtual Visit        Ana Baker has provided verbal consent on 09/15/2024 for a virtual visit (video or telephone).   CONSENT FOR VIRTUAL VISIT FOR:  Ana Baker  By participating in this virtual visit I agree to the following:  I hereby voluntarily request, consent and authorize [facility] and its employed or contracted physicians, physician assistants, nurse practitioners or other licensed health care professionals (the Practitioner), to provide me with telemedicine health care services (the Services) as deemed necessary by the treating Practitioner. I acknowledge and consent to receive the Services by the Practitioner via telemedicine. I understand that the telemedicine visit will involve communicating with the Practitioner through live audiovisual communication technology and the disclosure of certain medical information by electronic transmission. I acknowledge that I have been given the opportunity to request an in-person assessment or other available alternative prior to the telemedicine visit and am voluntarily participating in the telemedicine visit.  I understand that I have the right to withhold or withdraw my consent to the use of telemedicine in the course of my care at any time, without affecting my right to future care or treatment, and that the Practitioner or I may terminate the telemedicine visit at any time. I understand that I have the right to inspect all information obtained and/or recorded in the course of the telemedicine visit and may receive copies of available information for a reasonable fee.  I understand that some of the potential risks of receiving the Services via telemedicine include:  Delay or interruption in medical evaluation due to technological equipment failure or disruption; Information transmitted may not be sufficient (e.g. poor  resolution of images) to allow for appropriate medical decision making by the Practitioner; and/or  In rare instances, security protocols could fail, causing a breach of personal health information.  Furthermore, I acknowledge that it is my responsibility to provide information about my medical history, conditions and care that is complete and accurate to the best of my ability. I acknowledge that Practitioner's advice, recommendations, and/or decision may be based on factors not within their control, such as incomplete or inaccurate data provided by me or distortions of diagnostic images or specimens that may result from electronic transmissions. I understand that the practice of medicine is not an exact science and that Practitioner makes no warranties or guarantees regarding treatment outcomes. I acknowledge that a copy of this consent can be made available to me via my patient portal [facility] MyChart), or I can request a printed copy by calling the office of [facility].    I understand that my insurance will be billed for this visit.   I have read or had this consent read to me. I understand the contents of this consent, which adequately explains the benefits and risks of the Services being provided via telemedicine.  I have been provided ample opportunity to ask questions regarding this consent and the Services and have had my questions answered to my satisfaction. I give my informed consent for the services to be provided through the use of telemedicine in my medical care

## 2024-09-15 NOTE — Telephone Encounter (Signed)
 Ana Baker, I am sending this directly to you as it seems you have already addressed with this patient. I wanted to clarify everything was still the same.  Please advise holding Warfarin prior to NASAL ENDOSCOPY WITH B/L ANTROSTOMY MAXILLARY. Last labs 08/2024.  Thank you!  AW

## 2024-09-15 NOTE — Telephone Encounter (Signed)
"  ° °  Pre-operative Risk Assessment    Patient Name: Ana Baker  DOB: 10-08-1965 MRN: [id number]   Date of last office visit: 09/17/23 DR. HILTY Date of next office visit: 10/24/24 DR. HILTY   Request for Surgical Clearance    Procedure:  NASAL ENDOSCOPY WITH B/L ANTROSTOMY MAXILLARY  Date of Surgery:  Clearance 10/21/24                                Surgeon:  DR. ALM ADA Surgeon's Group or Practice Name:  [facility] Phone number:  [phone] Fax number:  [phone]   Type of Clearance Requested:   - Medical  - Pharmacy:  Hold Warfarin (Coumadin ) x 7 DAYS PRIOR   Type of Anesthesia:  General    Additional requests/questions:    Signed, Malissia Rabbani   09/15/2024, 8:18 AM +  "

## 2024-09-15 NOTE — Telephone Encounter (Signed)
 Pt has been scheduled tele preop appt 09/29/24. Med rec and consent are done.

## 2024-09-15 NOTE — Telephone Encounter (Signed)
" ° °  Name: Ana Baker  DOB: 1966-08-04  MRN: [id number]  Primary Cardiologist: Vinie JAYSON Maxcy, MD   Preoperative team, please contact this patient and set up a phone call appointment for further preoperative risk assessment. Please obtain consent and complete medication review. Thank you for your help.  I confirm that guidance regarding antiplatelet and oral anticoagulation therapy has been completed and, if necessary, noted below. Pharm to comment on Warfarin.   I also confirmed the patient resides in the state of [place] . As per [facility] Medical Board telemedicine laws, the patient must reside in the state in which the provider is licensed.   Mardy KATHEE Pizza, FNP 09/15/2024, 8:55 AM [facility]   "

## 2024-09-20 NOTE — Telephone Encounter (Signed)
 Patient with diagnosis of DVT/PE on warfarin for anticoagulation.    Procedure: NASAL ENDOSCOPY WITH B/L ANTROSTOMY MAXILLARY  Date of procedure: 10/21/24   CrCl 127 m/min Platelet count 249k   Per office protocol, patient can hold warfarin for 5 days  prior to procedure. If office requires a 7 day warfarin hold this will need to be authorized by cardiologist.  Patient WILL need bridging with Lovenox (enoxaparin) around procedure. This will be organized by the Coumadin  clinic.  **This guidance is not considered finalized until pre-operative APP has relayed final recommendations.**

## 2024-09-21 ENCOUNTER — Telehealth: Payer: Self-pay | Admitting: Pharmacist

## 2024-09-21 ENCOUNTER — Encounter: Payer: Self-pay | Admitting: Pharmacist

## 2024-09-21 ENCOUNTER — Ambulatory Visit: Payer: Self-pay | Admitting: Pharmacist

## 2024-09-21 ENCOUNTER — Ambulatory Visit: Attending: Cardiology | Admitting: Pharmacist

## 2024-09-21 DIAGNOSIS — I824Y9 Acute embolism and thrombosis of unspecified deep veins of unspecified proximal lower extremity: Secondary | ICD-10-CM

## 2024-09-21 DIAGNOSIS — Z86711 Personal history of pulmonary embolism: Secondary | ICD-10-CM | POA: Diagnosis not present

## 2024-09-21 DIAGNOSIS — Z7901 Long term (current) use of anticoagulants: Secondary | ICD-10-CM

## 2024-09-21 LAB — POCT INR: INR: 7.8 — AB (ref 2.0–3.0)

## 2024-09-21 LAB — PROTIME-INR
INR: 8.1 (ref 0.9–1.2)
Prothrombin Time: 85.5 s — ABNORMAL HIGH (ref 9.1–12.0)

## 2024-09-21 NOTE — Patient Instructions (Addendum)
 Description   INR 8.1. Patient will not take any warfarin before procedure. Will not start Lovenox. Recheck on 2/2 per GI

## 2024-09-21 NOTE — Progress Notes (Signed)
 Description   INR 8.1. Patient will not take any warfarin before procedure. Will not start Lovenox. Recheck on 2/2 per GI

## 2024-09-21 NOTE — Telephone Encounter (Incomplete)
" °  1/28: No warfarin or enoxaparin (Lovenox).  1/29: Inject enoxaparin***mg in the fatty abdominal tissue at least 2 inches from the belly button twice a day about 12 hours apart, 8am and 8pm rotate sites. No warfarin.  1/31: Inject enoxaparin in the fatty tissue every 12 hours, 8am and 8pm. No warfarin.  2/1: Inject enoxaparin in the fatty tissue every 12 hours, 8am and 8pm. No warfarin.  2/2: Inject enoxaparin in the fatty tissue in the morning at 8 am (No PM dose). No warfarin.  2/3: Procedure Day - No enoxaparin - Resume warfarin in the evening or as directed by doctor (take an extra half tablet with usual dose for 2 days then resume normal dose).  2/4: Resume enoxaparin inject in the fatty tissue every 12 hours and take warfarin  2/5: Inject enoxaparin in the fatty tissue every 12 hours and take warfarin  2/6: Inject enoxaparin in the fatty tissue every 12 hours and take warfarin  2/7: Inject enoxaparin in the fatty tissue every 12 hours and take warfarin  2/8: Inject enoxaparin in the fatty tissue every 12 hours and take warfarin  2/9: warfarin appt to check INR.  "

## 2024-09-21 NOTE — Telephone Encounter (Signed)
 Lab INR came back at 8.1. Contacted [facility] to discuss eevated INR since patient is scheduled for EGD/colon on 3/2. Advised I was unsure if INR will be reduced enough by 2/3 to perform procedure and patient has a 2 day Prep so if she was scheduled on Monday for recheck we would not be able to reduce her INR using vitamin K rich foods. Devere RN will discuss with Dr Marvis and call back.

## 2024-09-21 NOTE — Telephone Encounter (Signed)
 [facility] called back. MD would like to proceed with colonoscopy but requests INR check on Monday 2/2

## 2024-09-23 ENCOUNTER — Encounter: Payer: Self-pay | Admitting: Pharmacist

## 2024-09-26 ENCOUNTER — Ambulatory Visit

## 2024-09-26 ENCOUNTER — Other Ambulatory Visit (HOSPITAL_BASED_OUTPATIENT_CLINIC_OR_DEPARTMENT_OTHER): Payer: Self-pay | Admitting: Internal Medicine

## 2024-09-26 ENCOUNTER — Encounter: Payer: Self-pay | Admitting: Pharmacist

## 2024-09-29 ENCOUNTER — Ambulatory Visit: Admitting: Physician Assistant

## 2024-09-29 DIAGNOSIS — Z01810 Encounter for preprocedural cardiovascular examination: Secondary | ICD-10-CM

## 2024-09-29 NOTE — Progress Notes (Signed)
 "   Virtual Visit via Telephone Note   Because of Ana Baker co-morbid illnesses, she is at least at moderate risk for complications without adequate follow up.  This format is felt to be most appropriate for this patient at this time.  Due to technical limitations with video connection (technology), today's appointment will be conducted as an audio only telehealth visit, and Ana Baker verbally agreed to proceed in this manner.   All issues noted in this document were discussed and addressed.  No physical exam could be performed with this format.  Evaluation Performed:  Preoperative cardiovascular risk assessment _____________   Date:  09/29/2024   Patient ID:  Ana Baker, DOB 1965-11-25, MRN [id number] Patient Location:  Home Provider location:   Office  Primary Care Provider:  Joeline Georgeanne DELENA, MD Primary Cardiologist:  Vinie JAYSON Maxcy, MD  Chief Complaint / Patient Profile   59 y.o. y/o female with a h/o DVT, anemia, HTN who is pending nasal endoscopy with bilateral antrostomy maxillary and presents today for telephonic preoperative cardiovascular risk assessment.  History of Present Illness    Ana Baker is a 59 y.o. female who presents via audio/video conferencing for a telehealth visit today.  Pt was last seen in cardiology clinic on 09/17/23 by Dr. Maxcy.  At that time Ana Baker was doing well.  The patient is now pending procedure as outlined above. Since her last visit, she has done well since last year when she saw Dr. Maxcy.  She has not been as active these last few weeks due to the weather.  However, she has lost around 60 pounds due to dietary changes.  Her diet now mostly consists of lean meat, vegetables, and fruit.  She is limited her carbohydrates.  She also quit drinking diet soda as well and just sticks to tea, coffee, and water .  She has taken water  aerobics in the past and plans to get back into that when the weather gets a little bit better.  She is able  to meet 4 METS on the DASI.   Per office protocol, patient can hold warfarin for 5 days prior to procedure. If office requires a 7 day warfarin hold this will need to be authorized by cardiologist.   Past Medical History    Past Medical History:  Diagnosis Date   Anemia    Anxiety    DDD (degenerative disc disease), lumbar    DDD (degenerative disc disease), lumbar    Depression    Diabetes mellitus without complication (HCC)    DVT (deep venous thrombosis) (HCC) 04/2010   and PE, in post-operative setting    Endometriosis    GERD (gastroesophageal reflux disease)    History of kidney stones    History of pulmonary embolism    Hypertension    Idiopathic parathyroidism    Migraines    OA (osteoarthritis)    Obesity    Osteopenia    Osteoporosis    Pneumonia    x2   PONV (postoperative nausea and vomiting)    Spinal stenosis    Venous reflux    left leg   Vitamin D deficiency    Past Surgical History:  Procedure Laterality Date   ABDOMINAL HYSTERECTOMY  2008   BOWEL RESECTION  03/22/04   sbo   CHOLECYSTECTOMY OPEN  1990   COLONOSCOPY W/ POLYPECTOMY     GASTRIC BYPASS  07/31/2003   DVT post-op - IV filter   HERNIA REPAIR  08/2004   Internal hernia repair   JOINT REPLACEMENT     KIDNEY STONE SURGERY  98,00   KNEE ARTHROSCOPY Left 06,07   KNEE ARTHROSCOPY Left 12/2009   Dr. Gerome   LIPOMA EXCISION  03/12/04   right arm   NM MYOCAR PERF WALL MOTION  04/2010   dipyridamole; normal pattern of perfusion to all regions; EF 68%; low risk study    SALPINGOOPHORECTOMY Left    SPINE SURGERY  2009   TOTAL KNEE ARTHROPLASTY Right 82,98,99,00   DVT & PE post-op 1999   TOTAL KNEE ARTHROPLASTY Left 12/09/2019   Procedure: TOTAL KNEE ARTHROPLASTY;  Surgeon: Gerome Charleston, MD;  Location: [facility];  Service: Orthopedics;  Laterality: Left;  adductor canal   TRANSTHORACIC ECHOCARDIOGRAM  03/2012   EF=>55%, trace MR & TR, normal RSVP    Allergies  Allergies[1]  Home  Medications    Prior to Admission medications  Medication Sig Start Date End Date Taking? Authorizing Provider  Atogepant (QULIPTA) 60 MG TABS Take 60 tablets by mouth daily. 06/01/23   [provider]  atorvastatin  (LIPITOR) 40 MG tablet TAKE 1 TABLET EVERY DAY 02/19/24   Hilty, Vinie BROCKS, MD  calcium  carbonate (OSCAL) 1500 (600 Ca) MG TABS tablet Take by mouth 2 (two) times daily with a meal.    [provider]  cholecalciferol (VITAMIN D3) 25 MCG (1000 UNIT) tablet Take 1,000 Units by mouth daily.    [provider]  divalproex (DEPAKOTE) 500 MG DR tablet Take 500 mg by mouth 2 (two) times daily. 07/14/24   [provider]  famotidine  (PEPCID ) 40 MG tablet Take 1 tablet (40 mg total) by mouth at bedtime. 05/27/24   Hilty, Vinie BROCKS, MD  FLUoxetine  (PROZAC ) 40 MG capsule Take 40 mg daily by mouth.    [provider]  gabapentin  (NEURONTIN ) 300 MG capsule Take 300 mg by mouth 3 (three) times daily. Take one tablet 300mg  by mouth in the morning, and in the afternoon, take 2 tablets 600mg  at bedtime.    [provider]  HYDROcodone -acetaminophen  (NORCO) 7.5-325 MG tablet Take 1 tablet by mouth 3 (three) times daily as needed. 11/13/21   [provider]  influenza vac split quadrivalent PF (FLUARIX) 0.5 ML injection Inject into the muscle. 06/27/22   Luiz Channel, MD  l-methylfolate-B6-B12 (METANX) 3-35-2 MG TABS tablet Take 1 tablet by mouth daily.    [provider]  lubiprostone  (AMITIZA ) 24 MCG capsule Take 1 capsule (24 mcg total) by mouth 2 times daily with meals. 03/14/22     Methylcobalamin 1 MG CHEW Chew by mouth.    [provider]  metoprolol  succinate (TOPROL -XL) 25 MG 24 hr tablet Take 1 tablet (25 mg total) by mouth daily. 09/28/24   Hilty, Vinie BROCKS, MD  morphine  (MS CONTIN ) 15 MG 12 hr tablet Take 15 mg by mouth 3 (three) times daily.  09/14/13   [provider]  nitroGLYCERIN  (NITROSTAT ) 0.4 MG SL  tablet Place 1 tablet (0.4 mg total) under the tongue every 5 (five) minutes as needed for chest pain. 06/27/22 09/15/24  Walker, Caitlin S, NP  pantoprazole  (PROTONIX ) 40 MG tablet TAKE 1 TABLET TWICE DAILY 06/17/23   Walker, Caitlin S, NP  Pediatric Multivitamins-Iron (CHILDRENS MULTIVITAMIN/IRON PO) Take 1 tablet by mouth daily. Flinstones Patient not taking: Reported on 09/15/2024    [provider]  Pyridoxal-5 Phosphate 100 MG/ML SOLN Inject as directed. Patient not taking: Reported on 09/15/2024    [provider]  Semaglutide,0.25 or 0.5MG /DOS, (OZEMPIC, 0.25 OR 0.5 MG/DOSE,) 2 MG/3ML SOPN Inject 0.5 mg as directed once a week.    [provider]  SUMAtriptan  (IMITREX ) 100 MG tablet Take 1 tablet (100 mg total) by mouth every 2 (two) hours as needed for migraine. 10/27/13   Zanard, Robyn K, MD  warfarin (COUMADIN ) 5 MG tablet TAKE 1 TABLET TO 2 TABLETS BY MOUTH DAILY AS DIRECTED BY COUMADIN  CLINIC 09/15/24   Mona Vinie BROCKS, MD    Physical Exam    Vital Signs:  Ana Baker does not have vital signs available for review today.  Given telephonic nature of communication, physical exam is limited. AAOx3. NAD. Normal affect.  Speech and respirations are unlabored.  Accessory Clinical Findings    None  Assessment & Plan    1.  Preoperative Cardiovascular Risk Assessment:  Ms. Friesen perioperative risk of a major cardiac event is 0.4% according to the Revised Cardiac Risk Index (RCRI).  Therefore, she is at low risk for perioperative complications.   Her functional capacity is good at 5.99 METs according to the [place] Activity Status Index (DASI). Recommendations: According to ACC/AHA guidelines, no further cardiovascular testing needed.  The patient may proceed to surgery at acceptable risk.   Antiplatelet and/or Anticoagulation Recommendations:  Coumadin  can by held for 5 days prior to surgery.  Please resume post op when felt to be safe.   The patient was  advised that if she develops new symptoms prior to surgery to contact our office to arrange for a follow-up visit, and she verbalized understanding.  A copy of this note will be routed to requesting surgeon.  Time:   Today, I have spent 10 minutes with the patient with telehealth technology discussing medical history, symptoms, and management plan.     Orren LOISE Fabry, PA-C  09/29/2024, 10:04 AM     [1]  Allergies Allergen Reactions   Almond Oil Anaphylaxis and Rash    ALL ALMONDS!   Nsaids Nausea Only   Adhesive [Tape] Hives and Rash    Burns skin   Penicillins Rash    Did it involve swelling of the face/tongue/throat, SOB, or low BP? No Did it involve sudden or severe rash/hives, skin peeling, or any reaction on the inside of your mouth or nose? Yes Did you need to seek medical attention at a hospital or doctor's office? Was in the hospital when it happend When did it last happen?      21 years ago If all above answers are NO, may proceed with cephalosporin use.    Tolmetin Nausea Only   Vancomycin Rash   "

## 2024-09-30 ENCOUNTER — Ambulatory Visit

## 2024-09-30 DIAGNOSIS — I824Y9 Acute embolism and thrombosis of unspecified deep veins of unspecified proximal lower extremity: Secondary | ICD-10-CM

## 2024-09-30 DIAGNOSIS — Z86711 Personal history of pulmonary embolism: Secondary | ICD-10-CM

## 2024-09-30 DIAGNOSIS — Z7901 Long term (current) use of anticoagulants: Secondary | ICD-10-CM

## 2024-09-30 LAB — POCT INR: INR: 1.4 — AB (ref 2.0–3.0)

## 2024-09-30 NOTE — Patient Instructions (Signed)
 Description   INR 1.4. Take 2 tablets today and then continue 1 tablet daily except 2 tablets on Sundays, Tuesdays, and Thursdays. Recheck in 1 week  Coumadin  Clinic - [phone]

## 2024-09-30 NOTE — Progress Notes (Signed)
 Description   INR 1.4. Take 2 tablets today and then continue 1 tablet daily except 2 tablets on Sundays, Tuesdays, and Thursdays. Recheck in 1 week  Coumadin  Clinic - [phone]

## 2024-10-03 ENCOUNTER — Ambulatory Visit

## 2024-10-07 ENCOUNTER — Ambulatory Visit

## 2024-10-24 ENCOUNTER — Ambulatory Visit: Admitting: Internal Medicine
